# Patient Record
Sex: Female | Born: 1955 | Race: White | Hispanic: No | Marital: Married | Smoking: Never smoker
Health system: Southern US, Community
[De-identification: ages and names within clinical notes are randomized; demographics above are authoritative.]

## PROBLEM LIST (undated history)

## (undated) DIAGNOSIS — F319 Bipolar disorder, unspecified: Secondary | ICD-10-CM

## (undated) DIAGNOSIS — G8929 Other chronic pain: Secondary | ICD-10-CM

## (undated) DIAGNOSIS — K219 Gastro-esophageal reflux disease without esophagitis: Secondary | ICD-10-CM

## (undated) DIAGNOSIS — Z8639 Personal history of other endocrine, nutritional and metabolic disease: Secondary | ICD-10-CM

## (undated) DIAGNOSIS — G43109 Migraine with aura, not intractable, without status migrainosus: Secondary | ICD-10-CM

## (undated) DIAGNOSIS — K589 Irritable bowel syndrome without diarrhea: Secondary | ICD-10-CM

## (undated) DIAGNOSIS — K2270 Barrett's esophagus without dysplasia: Secondary | ICD-10-CM

## (undated) DIAGNOSIS — M810 Age-related osteoporosis without current pathological fracture: Secondary | ICD-10-CM

## (undated) DIAGNOSIS — E7800 Pure hypercholesterolemia, unspecified: Secondary | ICD-10-CM

## (undated) DIAGNOSIS — M5020 Other cervical disc displacement, unspecified cervical region: Secondary | ICD-10-CM

## (undated) DIAGNOSIS — M1990 Unspecified osteoarthritis, unspecified site: Secondary | ICD-10-CM

## (undated) DIAGNOSIS — T7840XA Allergy, unspecified, initial encounter: Secondary | ICD-10-CM

## (undated) DIAGNOSIS — M3390 Dermatopolymyositis, unspecified, organ involvement unspecified: Secondary | ICD-10-CM

## (undated) DIAGNOSIS — R519 Headache, unspecified: Secondary | ICD-10-CM

## (undated) DIAGNOSIS — F419 Anxiety disorder, unspecified: Secondary | ICD-10-CM

## (undated) DIAGNOSIS — K602 Anal fissure, unspecified: Secondary | ICD-10-CM

## (undated) DIAGNOSIS — F32A Depression, unspecified: Secondary | ICD-10-CM

## (undated) DIAGNOSIS — M3313 Other dermatomyositis without myopathy: Secondary | ICD-10-CM

## (undated) HISTORY — DX: Gastro-esophageal reflux disease without esophagitis: K21.9

## (undated) HISTORY — DX: Irritable bowel syndrome, unspecified: K58.9

## (undated) HISTORY — DX: Personal history of other endocrine, nutritional and metabolic disease: Z86.39

## (undated) HISTORY — DX: Unspecified osteoarthritis, unspecified site: M19.90

## (undated) HISTORY — DX: Bipolar disorder, unspecified: F31.9

## (undated) HISTORY — DX: Age-related osteoporosis without current pathological fracture: M81.0

## (undated) HISTORY — DX: Other chronic pain: G89.29

## (undated) HISTORY — DX: Depression, unspecified: F32.A

## (undated) HISTORY — DX: Migraine with aura, not intractable, without status migrainosus: G43.109

## (undated) HISTORY — DX: Other cervical disc displacement, unspecified cervical region: M50.20

## (undated) HISTORY — DX: Other dermatomyositis without myopathy: M33.13

## (undated) HISTORY — PX: GANGLION CYST EXCISION: SHX1691

## (undated) HISTORY — DX: Pure hypercholesterolemia, unspecified: E78.00

## (undated) HISTORY — DX: Anxiety disorder, unspecified: F41.9

## (undated) HISTORY — DX: Anal fissure, unspecified: K60.2

## (undated) HISTORY — PX: ESOPHAGOGASTRIC FUNDOPLICATION: SHX405

## (undated) HISTORY — DX: Barrett's esophagus without dysplasia: K22.70

## (undated) HISTORY — PX: APPENDECTOMY: SHX54

## (undated) HISTORY — PX: TUBAL LIGATION: SHX77

## (undated) HISTORY — DX: Allergy, unspecified, initial encounter: T78.40XA

## (undated) HISTORY — PX: HERNIA REPAIR: SHX51

## (undated) HISTORY — DX: Dermatopolymyositis, unspecified, organ involvement unspecified: M33.90

## (undated) HISTORY — DX: Headache, unspecified: R51.9

---

## 1979-06-27 HISTORY — PX: TONSILLECTOMY: SUR1361

## 1998-06-26 HISTORY — PX: OTHER SURGICAL HISTORY: SHX169

## 1999-06-27 HISTORY — PX: GALLBLADDER SURGERY: SHX652

## 2016-12-12 DIAGNOSIS — L659 Nonscarring hair loss, unspecified: Secondary | ICD-10-CM | POA: Insufficient documentation

## 2016-12-12 DIAGNOSIS — B081 Molluscum contagiosum: Secondary | ICD-10-CM | POA: Insufficient documentation

## 2016-12-12 DIAGNOSIS — K5790 Diverticulosis of intestine, part unspecified, without perforation or abscess without bleeding: Secondary | ICD-10-CM | POA: Insufficient documentation

## 2016-12-12 DIAGNOSIS — L280 Lichen simplex chronicus: Secondary | ICD-10-CM | POA: Insufficient documentation

## 2016-12-12 DIAGNOSIS — N952 Postmenopausal atrophic vaginitis: Secondary | ICD-10-CM | POA: Insufficient documentation

## 2016-12-12 DIAGNOSIS — G43909 Migraine, unspecified, not intractable, without status migrainosus: Secondary | ICD-10-CM | POA: Insufficient documentation

## 2016-12-12 DIAGNOSIS — B009 Herpesviral infection, unspecified: Secondary | ICD-10-CM | POA: Insufficient documentation

## 2016-12-12 DIAGNOSIS — K2970 Gastritis, unspecified, without bleeding: Secondary | ICD-10-CM | POA: Insufficient documentation

## 2016-12-12 DIAGNOSIS — E611 Iron deficiency: Secondary | ICD-10-CM | POA: Insufficient documentation

## 2016-12-12 DIAGNOSIS — B369 Superficial mycosis, unspecified: Secondary | ICD-10-CM | POA: Insufficient documentation

## 2016-12-12 DIAGNOSIS — Z889 Allergy status to unspecified drugs, medicaments and biological substances status: Secondary | ICD-10-CM | POA: Insufficient documentation

## 2016-12-12 DIAGNOSIS — F32A Depression, unspecified: Secondary | ICD-10-CM | POA: Insufficient documentation

## 2016-12-12 DIAGNOSIS — M797 Fibromyalgia: Secondary | ICD-10-CM | POA: Insufficient documentation

## 2016-12-12 DIAGNOSIS — Z9189 Other specified personal risk factors, not elsewhere classified: Secondary | ICD-10-CM | POA: Insufficient documentation

## 2017-05-11 DIAGNOSIS — E559 Vitamin D deficiency, unspecified: Secondary | ICD-10-CM | POA: Insufficient documentation
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
## 2018-01-12 ENCOUNTER — Ambulatory Visit (INDEPENDENT_AMBULATORY_CARE_PROVIDER_SITE_OTHER): Payer: Medicare Other | Admitting: Psychiatry

## 2018-01-12 ENCOUNTER — Encounter (INDEPENDENT_AMBULATORY_CARE_PROVIDER_SITE_OTHER): Payer: Self-pay

## 2018-01-12 ENCOUNTER — Other Ambulatory Visit (HOSPITAL_COMMUNITY): Payer: Self-pay

## 2018-01-12 ENCOUNTER — Encounter (HOSPITAL_COMMUNITY): Payer: Self-pay | Admitting: Psychiatry

## 2018-01-12 VITALS — BP 115/78 | HR 88 | Ht 68.0 in | Wt 159.6 lb

## 2018-01-12 DIAGNOSIS — F331 Major depressive disorder, recurrent, moderate: Secondary | ICD-10-CM | POA: Diagnosis not present

## 2018-01-12 MED ORDER — DULOXETINE HCL 60 MG PO CPEP: 60.0000 mg | ORAL_CAPSULE | Freq: Every day | ORAL | 0 refills | Status: DC

## 2018-01-12 MED ORDER — DULOXETINE HCL 40 MG PO CPEP: 40.0000 mg | ORAL_CAPSULE | Freq: Every evening | ORAL | 0 refills | Status: DC

## 2018-01-12 MED ORDER — DIVALPROEX SODIUM 250 MG PO DR TAB: 250.0000 mg | DELAYED_RELEASE_TABLET | Freq: Two times a day (BID) | ORAL | 0 refills | Status: DC

## 2018-01-12 MED ORDER — CLONAZEPAM 1 MG PO TABS: 1.0000 mg | ORAL_TABLET | Freq: Two times a day (BID) | ORAL | 0 refills | Status: DC

## 2018-01-12 NOTE — Patient Instructions (Addendum)
1. Increase Duloxetine 60 mg daily in the morning, 40 mg at night 2. Continue Valproic acid 250 mg daily, 500 mg qhs  3. Increase clonazepam 2 mg twice a day  4. Obtain blood test  4. Return to clinic in one month for 30 mins 5. Referral to Intensive outpatient program 6. CONTACT INFORMATION  What to do if you need to get in touch with someone regarding a psychiatric issue:  1. EMERGENCY: For psychiatric emergencies (if you are suicidal or if there are any other safety issues) call 911 and/or go to your nearest Emergency Room immediately.   2. IF YOU NEED SOMEONE TO TALK TO RIGHT NOW: Given my clinical responsibilities, I may not be able to speak with you over the phone for a prolonged period of time.  a. You may always call The National Suicide Prevention Lifeline at 1-800-273-TALK (332) [phone](8255).

## 2018-01-13 LAB — SPECIMEN STATUS REPORT

## 2018-01-14 LAB — COMPREHENSIVE METABOLIC PANEL
ALT: 26 IU/L (ref 0–32)
AST: 41 IU/L — ABNORMAL HIGH (ref 0–40)
Albumin/Globulin Ratio: 1.4 (ref 1.2–2.2)
Albumin: 4.2 g/dL (ref 3.6–4.8)
Alkaline Phosphatase: 115 IU/L (ref 39–117)
BUN/Creatinine Ratio: 25 (ref 12–28)
BUN: 24 mg/dL (ref 8–27)
Bilirubin Total: <0.2 mg/dL (ref 0.0–1.2)
CO2: 22 mmol/L (ref 20–29)
Calcium: 9.3 mg/dL (ref 8.7–10.3)
Chloride: 99 mmol/L (ref 96–106)
Creatinine, Ser: 0.97 mg/dL (ref 0.57–1.00)
GFR calc Af Amer: 72 mL/min/{1.73_m2} (ref >59)
GFR calc non Af Amer: 63 mL/min/{1.73_m2} (ref >59)
Globulin, Total: 3 g/dL (ref 1.5–4.5)
Glucose: 74 mg/dL (ref 65–99)
Potassium: 5.2 mmol/L (ref 3.5–5.2)
Sodium: 138 mmol/L (ref 134–144)
Total Protein: 7.2 g/dL (ref 6.0–8.5)

## 2018-01-14 LAB — CBC
Hematocrit: 40.1 % (ref 34.0–46.6)
Hemoglobin: 12.5 g/dL (ref 11.1–15.9)
MCH: 28.1 pg (ref 26.6–33.0)
MCHC: 31.2 g/dL — ABNORMAL LOW (ref 31.5–35.7)
MCV: 90 fL (ref 79–97)
Platelets: 296 10*3/uL (ref 150–450)
RBC: 4.45 x10E6/uL (ref 3.77–5.28)
RDW: 14 % (ref 12.3–15.4)
WBC: 10.2 10*3/uL (ref 3.4–10.8)

## 2018-01-14 LAB — PSA, TOTAL AND FREE
PSA, Free: <0.01 ng/mL
Prostate Specific Ag, Serum: <0.1 ng/mL

## 2018-01-14 LAB — TSH: TSH: 4.51 u[IU]/mL — ABNORMAL HIGH (ref 0.450–4.500)

## 2018-01-14 LAB — VALPROIC ACID LEVEL

## 2018-01-15 ENCOUNTER — Telehealth (HOSPITAL_COMMUNITY): Payer: Self-pay | Admitting: Professional

## 2018-01-15 LAB — SPECIMEN STATUS REPORT

## 2018-01-15 LAB — T4, FREE: Free T4: 0.93 ng/dL (ref 0.82–1.77)

## 2018-01-16 ENCOUNTER — Other Ambulatory Visit (HOSPITAL_COMMUNITY): Payer: Medicare Other | Attending: Psychiatry | Admitting: Licensed Clinical Social Worker

## 2018-01-16 ENCOUNTER — Other Ambulatory Visit (HOSPITAL_COMMUNITY): Payer: Self-pay

## 2018-01-16 DIAGNOSIS — F332 Major depressive disorder, recurrent severe without psychotic features: Secondary | ICD-10-CM | POA: Insufficient documentation

## 2018-01-16 DIAGNOSIS — Z818 Family history of other mental and behavioral disorders: Secondary | ICD-10-CM | POA: Diagnosis not present

## 2018-01-16 DIAGNOSIS — Z915 Personal history of self-harm: Secondary | ICD-10-CM | POA: Insufficient documentation

## 2018-01-16 DIAGNOSIS — F419 Anxiety disorder, unspecified: Secondary | ICD-10-CM | POA: Diagnosis not present

## 2018-01-16 DIAGNOSIS — D8989 Other specified disorders involving the immune mechanism, not elsewhere classified: Secondary | ICD-10-CM | POA: Diagnosis not present

## 2018-01-16 DIAGNOSIS — F329 Major depressive disorder, single episode, unspecified: Secondary | ICD-10-CM | POA: Diagnosis present

## 2018-01-16 DIAGNOSIS — F079 Unspecified personality and behavioral disorder due to known physiological condition: Secondary | ICD-10-CM | POA: Insufficient documentation

## 2018-01-16 DIAGNOSIS — Z79899 Other long term (current) drug therapy: Secondary | ICD-10-CM | POA: Diagnosis not present

## 2018-01-16 DIAGNOSIS — R4589 Other symptoms and signs involving emotional state: Secondary | ICD-10-CM | POA: Diagnosis not present

## 2018-01-16 DIAGNOSIS — F331 Major depressive disorder, recurrent, moderate: Secondary | ICD-10-CM

## 2018-01-17 ENCOUNTER — Telehealth (HOSPITAL_COMMUNITY): Payer: Self-pay | Admitting: Professional

## 2018-01-17 ENCOUNTER — Telehealth (HOSPITAL_COMMUNITY): Payer: Self-pay | Admitting: Psychiatry

## 2018-01-17 ENCOUNTER — Other Ambulatory Visit: Payer: Self-pay

## 2018-01-17 ENCOUNTER — Other Ambulatory Visit (HOSPITAL_COMMUNITY): Payer: Medicare Other | Admitting: Occupational Therapy

## 2018-01-17 ENCOUNTER — Other Ambulatory Visit (HOSPITAL_COMMUNITY): Payer: Medicare Other | Admitting: Licensed Clinical Social Worker

## 2018-01-17 ENCOUNTER — Encounter (HOSPITAL_COMMUNITY): Payer: Self-pay | Admitting: Occupational Therapy

## 2018-01-17 DIAGNOSIS — F079 Unspecified personality and behavioral disorder due to known physiological condition: Secondary | ICD-10-CM | POA: Diagnosis not present

## 2018-01-17 DIAGNOSIS — D8989 Other specified disorders involving the immune mechanism, not elsewhere classified: Secondary | ICD-10-CM | POA: Diagnosis not present

## 2018-01-17 DIAGNOSIS — F332 Major depressive disorder, recurrent severe without psychotic features: Secondary | ICD-10-CM

## 2018-01-17 DIAGNOSIS — Z915 Personal history of self-harm: Secondary | ICD-10-CM | POA: Diagnosis not present

## 2018-01-17 DIAGNOSIS — R4589 Other symptoms and signs involving emotional state: Secondary | ICD-10-CM

## 2018-01-17 DIAGNOSIS — F419 Anxiety disorder, unspecified: Secondary | ICD-10-CM | POA: Diagnosis not present

## 2018-01-17 DIAGNOSIS — Z818 Family history of other mental and behavioral disorders: Secondary | ICD-10-CM | POA: Diagnosis not present

## 2018-01-17 DIAGNOSIS — F329 Major depressive disorder, single episode, unspecified: Secondary | ICD-10-CM | POA: Diagnosis present

## 2018-01-17 DIAGNOSIS — Z79899 Other long term (current) drug therapy: Secondary | ICD-10-CM | POA: Diagnosis not present

## 2018-01-17 LAB — VALPROIC ACID LEVEL: Valproic Acid Lvl: 43 ug/mL — ABNORMAL LOW (ref 50–100)

## 2018-01-17 NOTE — Therapy (Signed)
[facility] PARTIAL HOSPITALIZATION PROGRAM [address] [place], [place], [address] Phone: [phone]   Fax:  [phone]  Occupational Therapy Evaluation  Patient Details  Name: Tina Pena MRN: [id number] Date of Birth: 02-11-1956 Referring Provider: Hillery Jacks, NP   Encounter Date: 01/17/2018  OT End of Session - 01/17/18 1459    Visit Number  1    Number of Visits  16    Date for OT Re-Evaluation  02/14/18    Authorization Type  Medicare    OT Start Time  1100    OT Stop Time  1230    OT Time Calculation (min)  90 min    Activity Tolerance  Patient tolerated treatment well    Behavior During Therapy  [facility] for tasks assessed/performed       Past Medical History:  Diagnosis Date  . Bipolar disorder [facility])     Past Surgical History:  Procedure Laterality Date  . APPENDECTOMY    . GALLBLADDER SURGERY  2001  . HERNIA REPAIR    . TONSILLECTOMY  1981  . toupetfundoplicatio  2000  . TUBAL LIGATION      There were no vitals filed for this visit.  Subjective Assessment - 01/17/18 1452    Currently in Pain?  Other (Comment) chronic pain        [facility] OT Assessment - 01/17/18 0001      Assessment   Medical Diagnosis  Major Depressive disorder, severe    Referring Provider  Hillery Jacks, NP    Onset Date/Surgical Date  01/17/18      Balance Screen   Has the patient fallen in the past 6 months  No    Has the patient had a decrease in activity level because of a fear of falling?   No    Is the patient reluctant to leave their home because of a fear of falling?   No        OT assessment: OCAIRS  Diagnosis: Major Depressive disorder, severe  Past medical history/referral information: Pt has significant PMH of mental health issues and suicide attempts, pt is referred from personal counselor and referred by provider Hillery Jacks, NP.  Living situation: Pt lives with husband and dog  ADLs/IADLs: Pt reports difficulty engaging in  BADL/IADL 2/2 to physical and mental limitations. Pt enjoys gardening and cannot engage as successfully 2/2 difficulty with hands (upcomin LRTI surgery). Pt reports isolation and family relationship difficulties as well.  Work: Pt has not worked since 2014, been on disability. Was previously a Human resources officer.  Leisure: Pt enjoys gardening as much as she can, reading, and exercising the little that she can  Social support: Pt reports husband as good social support. Pt shares she has a history of severely lying to friends and family members which has significantly harmed and destroyed relationships. She wishes to make amends with her family members, but believes some of her lies are so complex that she cannot make amends. Son lives in [place] and daughter in [place], wishes to see them more.  Struggles: emotional regulation, managing self harm  OT goal: accountability, coping skills  OCAIRS Mental Health Interview Summary of Client Scores:  FACILITATES PARTICIPATION IN OCCUPATION  ALLOWS PARTICIPATION IN OCCUPATION INHIBITS PARTICIPATION IN OCCUPATION RESTRICTS PARTICIPATION IN OCCUPATION COMMENTS  ROLES                X  Not working, little identified roles  HABITS  X  Very dissatisfied with routine  PERSONAL CAUSATION                X     VALUES                             X  Loosely identified  INTERESTS                X  Limited physically and mentally  SKILLS                X     SHORT TERM GOALS                X    LONG TERM GOALS                X    INTERPETATION OF PAST EXPERIENCES                X  All negative except when asked about best period  PHYSICAL ENVIRONMENT                X    SOCIAL ENVIRONMENT                X     READINESS FOR CHANGE                X      Need for Occupational Therapy:  4 Shows positive occupational participation, no need for OT.   3 Need for minimal intervention/consultative participation    X 2 Need for OT intervention indicated to  restore/improve participation   1 Need for extensive OT intervention indicated to improve participation.  Referral for follow up services also recommended.    Assessment:  Patient demonstrates behavior that inhibits participation in occupation. Patient will benefit from occupational therapy intervention in order to improve time management, financial management, stress management, job readiness skills, social skills, and health management skills in preparation to return to full time community living and to be a productive community member.    Plan:  Patient will participate in skilled occupational therapy sessions individually or in a group setting to improve coping skills, psychosocial skills, and emotional skills required to return to prior level of function. Treatment will be 4-5 times per week for 3 weeks.      OT Treatment  S: "My daughter will tell me I am passive aggressive but I don't see it"  O: Assertiveness quiz given to increase insight on pt current skills and how to improve based on given score. Further education given on assertive conversation, situations, body language, and appropriate context for skill. Education and worksheet given to identify three definitions (assertive, passive, and aggressive) with scenarios to identify assertive, passive, and aggressive with verbal and written examples. Education given on assertiveness techniques and effective assertiveness communication to apply to daily life when reintegrating into community.  A: Pt presents to group with animated affect, engaged and participatory throughout session. Assertiveness quiz completed with score stating pt is mostly nonassertive and needs consistent practice to maintain assertiveness in a variety of settings. Assertiveness training worksheet completed, pt shares others describe her as passive aggressive but her insight is limited. After education pt states understanding of passive aggressive tendencies and open to  communicating more assertively. Pt states her goal is to work on assertiveness in her close relationships.    P: Pt provided with assertiveness skills to implement into a  variety of daily social situations. OT will continue to follow up with communication skills for successful implementation into daily life.?          OT Education - 01/17/18 1455    Education Details  education given on assertiveness skills to apply when reintegrating into community    Person(s) Educated  Patient    Methods  Explanation;Handout    Comprehension  Verbalized understanding       OT Short Term Goals - 01/17/18 1503      OT SHORT TERM GOAL #1   Title  Pt will recall and apply accountability skills to improve relationships when reintegrating into the community    Time  4    Period  Weeks    Status  New    Target Date  02/14/18      OT SHORT TERM GOAL #2   Title  Pt will be educated on strategies to improve psychosocial skills needed to participate fully in all daily, work, and leisure activities    Time  4    Period  Weeks    Status  New      OT SHORT TERM GOAL #3   Title  Pt will aply psychosocial skills and coping mechanisms to daily activities in order to function independently and reintegrate into community dwelling    Time  4    Period  Weeks    Status  New               Plan - 01/17/18 1501    Occupational performance deficits (Please refer to evaluation for details):  ADL's;IADL's;Rest and Sleep;Education;Work;Leisure;Social Participation    Rehab Potential  Good    Current Impairments/barriers affecting progress:  physical limitations affecting mental health    OT Frequency  5x / week    OT Duration  4 weeks    OT Treatment/Interventions  Psychosocial skills training;Coping strategies training;Self-care/ADL training community reintegration    Consulted and Agree with Plan of Care  Patient       Patient will benefit from skilled therapeutic intervention in order to  improve the following deficits and impairments:  Decreased coping skills, Decreased psychosocial skills, Other (comment)(decreased ability to engage in BADL and reintegrate into community)  Visit Diagnosis: 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 MDD (major depressive disorder), recurrent episode, moderate (HCC) 01/12/2018   Dalphine Handing, MSOT, OTR/L  [place] 01/17/2018, 3:10 PM  [facility] PARTIAL HOSPITALIZATION PROGRAM [address] [place], [place], [address] Phone: [phone]   Fax:  [phone]  Name: Attie Nawabi MRN: [id number] Date of Birth: 02/02/1956

## 2018-01-17 NOTE — Telephone Encounter (Signed)
Discussed the test result with the patient.   VPA level is low; however, given the recent change in her medication and her enrollment in IOP, will continue current depakote dose at this time. Will consider uptitration in the future as indicated.   TSH- although slightly higher than normal range, fT4 is wnl. She is advised to be followed at annual checkup by PCP.
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
## 2018-01-18 ENCOUNTER — Other Ambulatory Visit (HOSPITAL_COMMUNITY): Payer: Medicare Other | Admitting: Occupational Therapy

## 2018-01-18 ENCOUNTER — Other Ambulatory Visit (HOSPITAL_COMMUNITY): Payer: Medicare Other | Admitting: Licensed Clinical Social Worker

## 2018-01-18 ENCOUNTER — Encounter (HOSPITAL_COMMUNITY): Payer: Self-pay | Admitting: Family

## 2018-01-18 VITALS — BP 118/76 | HR 75 | Ht 68.25 in | Wt 160.0 lb

## 2018-01-18 DIAGNOSIS — F079 Unspecified personality and behavioral disorder due to known physiological condition: Secondary | ICD-10-CM

## 2018-01-18 DIAGNOSIS — F332 Major depressive disorder, recurrent severe without psychotic features: Secondary | ICD-10-CM | POA: Diagnosis not present

## 2018-01-18 DIAGNOSIS — R4589 Other symptoms and signs involving emotional state: Secondary | ICD-10-CM

## 2018-01-18 NOTE — Progress Notes (Signed)
Behavioral Health Partial Program Assessment Note  Date: 01/18/2018 Name: Tina Pena MRN: [id number]  Chief Complaint: Worsening Depression    HPI: Patient is a [age] y.o. Caucasian female presents with depression.  Reports she felt life her life stated to come apart after a suicide attempt with cutting herself all over with a box cutter. States she was then charged with filling a false police reports and has been fighting a Administrator, sportsmisdemeanor charge.  States she has not disclosed this information to her husband as she reports her husband is a unsupportive regarding her mental health issues.  Reports diagnosis of bipolar, depression, anxiety and paranoia.  patient is currently denying suicidal homicidal ideations.  Reports she is followed by MD Hisada.  Reports she is prescribed gabapentin 300 mg, Duloxetine 40 mg, clonazepam 1 mg PRN, reports she was recently initiated on Depakote 250 mg p.o. twice daily.  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  Patient was enrolled in partial psychiatric program on 01/18/18.  Primary complaints include: anxiety attacks.  Onset of symptoms was gradual with gradually worsening course since that time. Psychosocial Stressors include the following: family and marital.   I have reviewed the following documentation dated 01/19/2018: past psychiatric history, past medical history and past social and family history  Complaints of Pain: Reports pain related to autoimmune disorder Past Psychiatric History:  Previous suicide attempts 2  Currently in treatment with Cymbalta 60, Depakote 250, Klonopin 1 mg p.o twice daily and Neurontin 300 mg p.o. twice daily.  Substance Abuse History: none Use of Alcohol: denied Use of Caffeine: denies use Use of over the counter:   Past Surgical History:  Procedure Laterality Date  . APPENDECTOMY    . GALLBLADDER SURGERY  2001  . HERNIA REPAIR    . TONSILLECTOMY  1981  . toupetfundoplicatio  2000   . TUBAL LIGATION      Past Medical History:  Diagnosis Date  . Bipolar disorder [facility](HCC)    Outpatient Encounter Medications as of 01/18/2018  Medication Sig  . [START ON 01/25/2018] clonazePAM (KLONOPIN) 1 MG tablet Take 1 tablet (1 mg total) by mouth 2 (two) times daily.  Marland Kitchen. conjugated estrogens (PREMARIN) vaginal cream Place 1 Applicatorful vaginally daily. Twice a week  . divalproex (DEPAKOTE) 250 MG DR tablet Take 1 tablet (250 mg total) by mouth 2 (two) times daily. 1 in the AM and 2 in the PM  . DULoxetine (CYMBALTA) 60 MG capsule Take 1 capsule (60 mg total) by mouth daily. 1 tab by mouth daily  . DULoxetine HCl 40 MG CPEP Take 40 mg by mouth every evening.  . eszopiclone (LUNESTA) 1 MG TABS tablet Take 1 mg by mouth at bedtime as needed for sleep. Take immediately before bedtime as needed  . gabapentin (NEURONTIN) 300 MG capsule Take 300 mg by mouth 2 (two) times daily. 1 in the AM and 1 in the PM  . hydroxychloroquine (PLAQUENIL) 200 MG tablet Take 200 mg by mouth 2 (two) times daily. 1 tab by mouth in AM, 1 tab by mouth in PM  . Loratadine-Pseudoephedrine (CLARITIN-D 24 HOUR PO) Take by mouth. 1 tab by mouth in AM as needed  . rizatriptan (MAXALT) 5 MG tablet Take 5 mg by mouth as needed for migraine. May repeat in 2 hours if needed  . traMADol (ULTRAM) 50 MG tablet Take 50 mg by mouth every 6 (six) hours as needed for moderate pain (6-8 per day depending on pain).   No facility-administered  encounter medications on file as of 01/18/2018.    Not on File  Social History   Tobacco Use  . Smoking status: Never Smoker  . Smokeless tobacco: Never Used  Substance Use Topics  . Alcohol use: Yes    Alcohol/week: 0.6 oz    Types: 1 Glasses of wine per week    Comment: rare   Functioning Relationships: strained with spouse or significant others Education: College       Please specify degree:  Other Pertinent History: None legal Family History  Problem Relation Age of Onset  .  Bipolar disorder Father   . Anxiety disorder Father   . Depression Father   . Paranoid behavior Father   . Bipolar disorder Maternal Grandmother   . Dementia Maternal Grandmother   . Bipolar disorder Paternal Grandmother   . Dementia Paternal Grandmother      Review of Systems Constitutional: negative  Objective:  There were no vitals filed for this visit.  Physical Exam:   Mental Status Exam: Appearance:  Well groomed Psychomotor::  Within Normal Limits Attention span and concentration: Normal Behavior: calm, cooperative and adequate rapport can be established Speech:  normal pitch Mood:  depressed and anxious Affect:  normal Thought Process:  Coherent Thought Content:  Hallucinations: None Orientation:  person, place and time/date Cognition:  grossly intact Insight:  Intact Judgment:  Fair Estimate of Intelligence: Average Fund of knowledge: Aware of current events Memory: Recent and remote intact Abnormal movements: None Gait and station: Normal  Assessment:  Diagnosis: MDD (major depressive disorder), recurrent severe, without psychosis (HCC) [F33.2] 1. MDD (major depressive disorder), recurrent severe, without psychosis (HCC)     Indications for admission: inpatient care required if not in partial hospital program  Plan: Order placed for occupational therapy (OT) Patient enrolled in Partial Hospitalization Program Patient's current medications are to be continued comprehensive treatment plan will be developed and side effects of medications have been reviewed with patient   Chart reviewed Valproic level 43 on 01/16/2018 however per chart, however Depakote was initiated recently per MD Hisada (Psychiatrist)  Treatment options and alternatives reviewed with patient and patient understands the above plan.  Treatment reviewed and agreed upon by NP T Melvyn Neth and Tina Pena need for continued group services       Oneta Rack, NP
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
## 2018-01-21 ENCOUNTER — Other Ambulatory Visit (HOSPITAL_COMMUNITY): Payer: Medicare Other | Admitting: Licensed Clinical Social Worker

## 2018-01-21 ENCOUNTER — Encounter (HOSPITAL_COMMUNITY): Payer: Self-pay | Admitting: Occupational Therapy

## 2018-01-21 ENCOUNTER — Other Ambulatory Visit (HOSPITAL_COMMUNITY): Payer: Medicare Other | Admitting: Occupational Therapy

## 2018-01-21 DIAGNOSIS — F079 Unspecified personality and behavioral disorder due to known physiological condition: Secondary | ICD-10-CM

## 2018-01-21 DIAGNOSIS — F332 Major depressive disorder, recurrent severe without psychotic features: Secondary | ICD-10-CM | POA: Diagnosis not present

## 2018-01-21 DIAGNOSIS — D8989 Other specified disorders involving the immune mechanism, not elsewhere classified: Secondary | ICD-10-CM | POA: Diagnosis not present

## 2018-01-21 DIAGNOSIS — R4589 Other symptoms and signs involving emotional state: Secondary | ICD-10-CM

## 2018-01-21 DIAGNOSIS — Z818 Family history of other mental and behavioral disorders: Secondary | ICD-10-CM | POA: Diagnosis not present

## 2018-01-21 DIAGNOSIS — Z915 Personal history of self-harm: Secondary | ICD-10-CM | POA: Diagnosis not present

## 2018-01-21 DIAGNOSIS — F419 Anxiety disorder, unspecified: Secondary | ICD-10-CM | POA: Diagnosis not present

## 2018-01-21 DIAGNOSIS — Z79899 Other long term (current) drug therapy: Secondary | ICD-10-CM | POA: Diagnosis not present

## 2018-01-21 DIAGNOSIS — F329 Major depressive disorder, single episode, unspecified: Secondary | ICD-10-CM | POA: Diagnosis present

## 2018-01-21 NOTE — Therapy (Addendum)
[facility] PARTIAL HOSPITALIZATION PROGRAM [address] [place], [place], [address] Phone: [phone]   Fax:  [phone]  Occupational Therapy Treatment  Patient Details  Name: Chelly Dombeck MRN: [id number] Date of Birth: [birth year] Referring Provider: Hillery Jacks, NP   Encounter Date: 01/18/2018  OT End of Session - 01/21/18 1333    Visit Number  2    Number of Visits  16    Date for OT Re-Evaluation  02/14/18    Authorization Type  Medicare    OT Start Time  1100    OT Stop Time  1200    OT Time Calculation (min)  60 min    Activity Tolerance  Patient tolerated treatment well    Behavior During Therapy  [facility] for tasks assessed/performed       Past Medical History:  Diagnosis Date  . Bipolar disorder [facility])     Past Surgical History:  Procedure Laterality Date  . APPENDECTOMY    . GALLBLADDER SURGERY  2001  . HERNIA REPAIR    . TONSILLECTOMY  1981  . toupetfundoplicatio  2000  . TUBAL LIGATION      There were no vitals filed for this visit.  Subjective Assessment - 01/21/18 1332    Currently in Pain?  Other (Comment) chronic pain       O: Education given on definition and importance of positive self-esteem in daily life and relationships with focus on using positive self-talk. Further education given on the relationship between self-esteem and mental illness and both high and low self-esteem factors. Worksheet given for pt to identify a positive trait about themselves with each letter of the alphabet to use as reference for future times of need and to gain insight on numerous positive qualities.?Additional self esteem worksheet given to identify positive aspects in every area of life (work, leisure, relationships, etc.). Pt encouraged to display worksheets in easily accessible area to serve as visual reminder in daily routine. Pt asked to identify 3 things they love about themselves this date.   A: Pt presents to group with blunted  affect, participatory and engaged throughout entirety of session. Pt originally with negative mindset with self esteem activities, with encouragement client engaged appropriately, group members also helped build up pt in a positive manner. Pt completed self-esteem alphabet activity with minimal verbal cues. Pt also provided support to other group members to help brainstorm ideas. Pt completed additional self esteem worksheet, becoming tearful when thinking about her interaction she shared with her son's girlfriend. 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     P: Pt provided with self-esteem boosting skills to implement into a variety of daily activities/routines. OT will continue to follow up for successful implementation into daily life.                 OT Education - 01/21/18 1332    Education Details  education given on self esteem and how to improve when reintegrating into community    Person(s) Educated  Patient    Methods  Explanation;Handout    Comprehension  Verbalized understanding       OT Short Term Goals - 01/21/18 1333      OT SHORT TERM GOAL #1   Title  Pt will recall and apply accountability skills to improve relationships when reintegrating into the community    Time  4    Period  Weeks    Status  On-going    Target Date  02/14/18  OT SHORT TERM GOAL #2   Title  Pt will be educated on strategies to improve psychosocial skills needed to participate fully in all daily, work, and leisure activities    Time  4    Period  Weeks    Status  On-going      OT SHORT TERM GOAL #3   Title  Pt will aply psychosocial skills and coping mechanisms to daily activities in order to function independently and reintegrate into community dwelling    Time  4    Period  Weeks    Status  On-going               Plan - 01/21/18 1333    Occupational performance deficits (Please refer to evaluation for details):  ADL's;IADL's;Rest and  Sleep;Education;Work;Leisure;Social Participation       Patient will benefit from skilled therapeutic intervention in order to improve the following deficits and impairments:  Decreased coping skills, Decreased psychosocial skills, Other (comment)(decreased ability to engage in BADL and reintegrate into community)  Visit Diagnosis: 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 MDD (major depressive disorder), recurrent episode, moderate (HCC) 01/12/2018   Dalphine HandingKaylee Keland Peyton, MSOT, OTR/L  [place] Karah Caruthers 01/21/2018, 1:34 PM  [facility] [address] [place], [place], [address] Phone: [phone]   Fax:  [phone]  Name: Dena BilletStephanie Boom MRN: [id number] Date of Birth: December 21, 1955

## 2018-01-21 NOTE — Therapy (Signed)
[facility] PARTIAL HOSPITALIZATION PROGRAM [address] [place], [place], [address] Phone: [phone]   Fax:  [phone]  Occupational Therapy Treatment  Patient Details  Name: Tina Pena MRN: [id number] Date of Birth: 1955-07-14 Referring Provider: Hillery Jacks, NP   Encounter Date: 01/21/2018  OT End of Session - 01/21/18 1532    Visit Number  3    Number of Visits  16    Date for OT Re-Evaluation  02/14/18    Authorization Type  Medicare    OT Start Time  1115    OT Stop Time  1215    OT Time Calculation (min)  60 min    Activity Tolerance  Patient tolerated treatment well    Behavior During Therapy  [facility] for tasks assessed/performed       Past Medical History:  Diagnosis Date  . Bipolar disorder [facility])     Past Surgical History:  Procedure Laterality Date  . APPENDECTOMY    . GALLBLADDER SURGERY  2001  . HERNIA REPAIR    . TONSILLECTOMY  1981  . toupetfundoplicatio  2000  . TUBAL LIGATION      There were no vitals filed for this visit.  Subjective Assessment - 01/21/18 1530    Currently in Pain?  Other (Comment) mentions of chronic pain        S: "I think sense of purpose is very important"   O: Education given on protective factors and their importance in building resiliency to face difficult life challenges. Protective factors worksheet completed. Pt to rate current protective factors of social support, coping skills, physical health, sense of purpose, self-esteem, and healthy thinking on a scale from weak-moderate-strong. Pt then to identify the most valuable protective factor, 2 protective factors to improve, and specific goals to accomplish this task.   A: Pt presents to group with flat affect, engaged and offering examples with other group members and facilitator. Pt completed protective factors worksheet, identifying that she scores strong on coping skills and self esteem. Pt with poor insight and awareness, after  considerable difficulty with self esteem on previous session (pt very tearful). Pt plans to work on improving social support by working on the relationship with her and her husband.  P: Education given on protective factors and goal setting using art as therapeutic tool. OT will continue follow up with pt to ensure successful implementation in daily life.                    OT Education - 01/21/18 1531    Education Details  education given on protective factors to learn to increase resiliency to difficult situations when reintegrating into community    Person(s) Educated  Patient    Methods  Explanation;Handout    Comprehension  Verbalized understanding       OT Short Term Goals - 01/21/18 1333      OT SHORT TERM GOAL #1   Title  Pt will recall and apply accountability skills to improve relationships when reintegrating into the community    Time  4    Period  Weeks    Status  On-going    Target Date  02/14/18      OT SHORT TERM GOAL #2   Title  Pt will be educated on strategies to improve psychosocial skills needed to participate fully in all daily, work, and leisure activities    Time  4    Period  Weeks    Status  On-going      OT SHORT TERM GOAL #3   Title  Pt will aply psychosocial skills and coping mechanisms to daily activities in order to function independently and reintegrate into community dwelling    Time  4    Period  Weeks    Status  On-going               Plan - 01/21/18 1532    Occupational performance deficits (Please refer to evaluation for details):  ADL's;IADL's;Rest and Sleep;Work;Leisure;Social Participation       Patient will benefit from skilled therapeutic intervention in order to improve the following deficits and impairments:  Decreased coping skills, Decreased psychosocial skills, Other (comment)(decreased ability to engage in BADL and reintegrate into community)  Visit Diagnosis: Personality and behavioral disorder due to  known physiological condition  Difficulty coping  MDD (major depressive disorder), recurrent severe, without psychosis (HCC)    Problem List Patient Active Problem List   Diagnosis Date Noted  . MDD (major depressive disorder), recurrent episode, moderate (HCC) 01/12/2018   Dalphine HandingKaylee Jisela Merlino, MSOT, OTR/L  [place] Janaisa Birkland 01/21/2018, 3:34 PM  [facility] [address] [place], [place], [address] Phone: [phone]   Fax:  [phone]  Name: Dena BilletStephanie Boehme MRN: [id number] Date of Birth: 10/01/1955

## 2018-01-22 ENCOUNTER — Other Ambulatory Visit (HOSPITAL_COMMUNITY): Payer: Medicare Other | Admitting: Licensed Clinical Social Worker

## 2018-01-22 ENCOUNTER — Encounter (HOSPITAL_COMMUNITY): Payer: Self-pay | Admitting: Occupational Therapy

## 2018-01-22 ENCOUNTER — Other Ambulatory Visit (HOSPITAL_COMMUNITY): Payer: Medicare Other | Admitting: Occupational Therapy

## 2018-01-22 DIAGNOSIS — F332 Major depressive disorder, recurrent severe without psychotic features: Secondary | ICD-10-CM

## 2018-01-22 DIAGNOSIS — F079 Unspecified personality and behavioral disorder due to known physiological condition: Secondary | ICD-10-CM

## 2018-01-22 DIAGNOSIS — R4589 Other symptoms and signs involving emotional state: Secondary | ICD-10-CM

## 2018-01-22 NOTE — Therapy (Signed)
[facility] PARTIAL HOSPITALIZATION PROGRAM [address] [place], [place], [address] Phone: [phone]   Fax:  [phone]  Occupational Therapy Treatment  Patient Details  Name: Tina Pena MRN: [id number] Date of Birth: March 21, 1956 Referring Provider: Hillery Jacks, NP   Encounter Date: 01/22/2018  OT End of Session - 01/22/18 1430    Visit Number  4    Number of Visits  16    Date for OT Re-Evaluation  02/14/18    Authorization Type  Medicare    OT Start Time  1100    OT Stop Time  1200    OT Time Calculation (min)  60 min    Activity Tolerance  Patient tolerated treatment well    Behavior During Therapy  [facility] for tasks assessed/performed       Past Medical History:  Diagnosis Date  . Bipolar disorder [facility])     Past Surgical History:  Procedure Laterality Date  . APPENDECTOMY    . GALLBLADDER SURGERY  2001  . HERNIA REPAIR    . TONSILLECTOMY  1981  . toupetfundoplicatio  2000  . TUBAL LIGATION      There were no vitals filed for this visit.  Subjective Assessment - 01/22/18 1429    Currently in Pain?  Other (Comment) chronic pain        S: "I feel like I am doing better once I learned to listen"   O: Education given on communication skills to apply when reintegrating into community dwelling. Connection made of how mental health conditions tie into communication difficulties and how to improve these practices. Nonverbal and conversational skills discussed and applied to pt daily life. Further education given on how to practice these skills within the community to build confidence. Education also given on how to express feelings/emotions to others with appropriate social skills. Active listening discussed for further sessions, handout given for pt to reference. Pt asked to identify one area of communication they wish to develop this date.  ?  A: Pt presents to group with flat affect, engaged and participatory throughout session. Pt  educated on nonverbal and conversational skills to apply when reintegrating into community this date. Pt shares that she has most difficulty when expressing her emotions to her husband. Pt with minimal insight, stating she feels her communication is fine and it is more of her husband's flaw. After example given, pt given education to increase insight- pt in understanding but unsure if pt retained. Continued education needed to ensure understanding. Pt in understanding of active listening and acquired handout.   P: Pt provided with communication skills to implement when reintegrating into community dwelling. OT will continue follow up with communication skills for successful implementation in daily life.                   OT Education - 01/22/18 1429    Education Details  education given on communication skills to apply when reintegrating into community    Person(s) Educated  Patient    Methods  Explanation;Handout    Comprehension  Verbalized understanding       OT Short Term Goals - 01/21/18 1333      OT SHORT TERM GOAL #1   Title  Pt will recall and apply accountability skills to improve relationships when reintegrating into the community    Time  4    Period  Weeks    Status  On-going    Target Date  02/14/18  OT SHORT TERM GOAL #2   Title  Pt will be educated on strategies to improve psychosocial skills needed to participate fully in all daily, work, and leisure activities    Time  4    Period  Weeks    Status  On-going      OT SHORT TERM GOAL #3   Title  Pt will aply psychosocial skills and coping mechanisms to daily activities in order to function independently and reintegrate into community dwelling    Time  4    Period  Weeks    Status  On-going               Plan - 01/22/18 1430    Occupational performance deficits (Please refer to evaluation for details):  ADL's;IADL's;Rest and Sleep;Work;Leisure;Social Participation       Patient will  benefit from skilled therapeutic intervention in order to improve the following deficits and impairments:  Decreased coping skills, Decreased psychosocial skills, Other (comment)(decreased ability to engage in BADL and reintegrate into community)  Visit Diagnosis: 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 MDD (major depressive disorder), recurrent episode, moderate (HCC) 01/12/2018   Dalphine HandingKaylee Tamaira Ciriello, MSOT, OTR/L  [place] Zollie Ellery 01/22/2018, 2:31 PM  [facility] [address] [place], [place], [address] Phone: [phone]   Fax:  [phone]  Name: Dena BilletStephanie Yebra MRN: [id number] Date of Birth: Nov 10, 1955

## 2018-01-22 NOTE — Psych (Signed)
   [facility]  Tina BilletStephanie Silas [id number]  Session Time: 9:00 - 11:00  Participation Level: Active  Behavioral Response: CasualAlertDepressed  Type of Therapy: Group Therapy  Treatment Goals addressed: Coping  Interventions: CBT, DBT, Solution Focused, Supportive and Reframing  Summary: Clinician led check-in regarding current stressors and situation, and review of patient completed daily inventory. Clinician utilized active listening and empathetic response and validated patient emotions. Clinician facilitated processing group on pertinent issues.   Therapist Response: Tina Pena is a 62 y.o. female who presents with depression and mood symptoms. Patient arrived within time allowed and reports that she is feeling "hopeful." Patient rates her mood at a 5 on a scale of 1-10 with 10 being great. Pt shares she felt "better" after CCA yesterday and being able to "share my truths" with a professional. Pt reports struggling with irritability and becoming angry at a picture of family on Facebook last night. Pt reports her family is a trigger for her.  Patient able to process in group. Patient engaged in discussion.      Session Time: 11:00 -12:00   Participation Level: Active   Behavioral Response: CasualAlertDepressed   Type of Therapy: Group Therapy, OT   Treatment Goals addressed: Coping   Interventions: Psychosocial skills training, Supportive,    Summary:  Occupational Therapy group   Therapist Response: Patient engaged in group. See OT note.            Session Time: 12:00 - 12:45  Participation Level: Active  Behavioral Response: CasualAlertDepressed  Type of Therapy: Group Therapy, Activity Therapy  Treatment Goals addressed: Coping  Interventions: Psychologist, occupationalocial Skills Training, Supportive  Summary:  Reflection Group: Patients encouraged to practice skills and interpersonal techniques or work on mindfulness and relaxation techniques.  The importance of self-care and making skills part of a routine to increase usage were stressed   Therapist Response: Patient engaged and participated.        Session Time: 12:45- 2:00  Participation Level: Active  Behavioral Response: CasualAlertDepressed  Type of Therapy: Group Therapy, Psychoeducation; Psychotherapy  Treatment Goals addressed: Coping  Interventions: CBT; Solution focused; Supportive; Reframing  Summary: 12:45 - 1:50: Cln continued topic of cognitive distortions. Group discussed how to "challenge" the unhealthy thought patterns once recognized. Group reviewed "Socratic Questions" handout and went over how to challenge irrational thoughts using that handout.  1:50 -2:00 Clinician led check-out. Clinician assessed for immediate needs, medication compliance and efficacy, and safety concerns   Therapist Response: Patient engaged in activity. Pt states understanding of how to challenge unhealthy thoughts and successfully reframed examples in discussion.  At check-out, patient rates her mood at a 8 on a scale of 1-10 with 10 being great. Pt reports afternoon plans of running errands. Patient demonstrates some progress as evidenced by participating in first session. Patient denies SI/HI/self-harm thoughts at the end of group.     Suicidal/Homicidal: Nowithout intent/plan   Plan: Pt will continue in PHP while working to decrease depression symptoms and increase ability to manage symptoms as they arise.   Diagnosis: MDD (major depressive disorder), recurrent severe, without psychosis (HCC) [F33.2]    1. MDD (major depressive disorder), recurrent severe, without psychosis (HCC)       Donia GuilesJenny Dudley Mages, LCSW 01/22/2018

## 2018-01-23 ENCOUNTER — Encounter (HOSPITAL_COMMUNITY): Payer: Self-pay

## 2018-01-23 ENCOUNTER — Other Ambulatory Visit (HOSPITAL_COMMUNITY): Payer: Medicare Other | Admitting: Licensed Clinical Social Worker

## 2018-01-23 VITALS — BP 110/68 | HR 84 | Ht 68.25 in | Wt 161.0 lb

## 2018-01-23 DIAGNOSIS — F332 Major depressive disorder, recurrent severe without psychotic features: Secondary | ICD-10-CM

## 2018-01-23 NOTE — Psych (Signed)
   [facility]  Tina BilletStephanie Pena [id number]  Session Time: 9:00 - 10:15  Participation Level: Active  Behavioral Response: CasualAlertDepressed  Type of Therapy: Group Therapy  Treatment Goals addressed: Coping  Interventions: CBT, DBT, Solution Focused, Supportive and Reframing  Summary: Clinician led check-in regarding current stressors and situation, and review of patient completed daily inventory. Clinician utilized active listening and empathetic response and validated patient emotions. Clinician facilitated processing group on pertinent issues.   Therapist Response: Tina BilletStephanie Stolarz is a 62 y.o. female who presents with depression and mood symptoms. Patient arrived within time allowed and reports that she is feeling "okay." Patient rates her mood at a 7 on a scale of 1-10 with 10 being great. Pt reports she felt "really good" after group yesterday and was able to practice some tools. Pt reports her physical pain is exacerbated.  Patient able to process in group. Patient engaged in discussion.      Session Time: 10:15 -11:00  Participation Level: Active  Behavioral Response: CasualAlertDepressed  Type of Therapy: Group Therapy, psychoeducation, psychotherapy  Treatment Goals addressed: Coping  Interventions: CBT, DBT, Solution Focused, Supportive and Reframing  Summary:  Clinician led discussion on increasing social circle and support network. Group discussed barriers to being around people while going through their mental health struggles.     Therapist Response: Patient engaged and participated in discussion. Pt reports relying on friends for social support and she struggles that her husband is not supportive.      Session Time: 11:00 -12:00   Participation Level: Active   Behavioral Response: CasualAlertDepressed   Type of Therapy: Group Therapy, OT   Treatment Goals addressed: Coping   Interventions: Psychosocial skills training,  Supportive,    Summary:  Occupational Therapy group   Therapist Response: Patient engaged in group. See OT note.       Session Time: 12:00- 1:00  Participation Level: Active  Behavioral Response: CasualAlertDepressed  Type of Therapy: Group Therapy, Psychoeducation; Psychotherapy  Treatment Goals addressed: Coping  Interventions: CBT; Solution focused; Supportive; Reframing Summary: 12:00 - 12:50: Clinician introduced topic of "Positive Psychology." Group watched "The Happiness Advantage" TED talk and discussed how the "lens" through which they view life affects the way they feel. Pts identified a strategy they would be willing to try to change their "lens."  12:50 -1:00 Clinician led check-out. Clinician assessed for immediate needs, medication compliance and efficacy, and safety concerns.   Therapist Response:  Pt engaged in discussion regarding ways to train your mind to scan for the positive. Pt reports willingness to try daily exercise as a way to practice.   At check-out, patient rates her mood at a 5 on a scale of 1-10 with 10 being great. Patient reports weekend plans of having dinner with firends.  Patient demonstrates some progress as evidenced by practicing skills. Patient denies SI/HI/self-harm at the end of group.    Suicidal/Homicidal: Nowithout intent/plan   Plan: Pt will continue in PHP while working to decrease depression symptoms and increase ability to manage symptoms as they arise.   Diagnosis: MDD (major depressive disorder), recurrent severe, without psychosis (HCC) [F33.2]    1. MDD (major depressive disorder), recurrent severe, without psychosis (HCC)       Donia GuilesJenny Taquan Bralley, LCSW 01/23/2018

## 2018-01-23 NOTE — Progress Notes (Signed)
Patient presented with appropriate affect, continued depressed mood but denied any current suicidal or homicidal ideations, no auditory or visual hallucinations and plan or intent to harm self or others at this time.  Patient admitted to "cutting" on the top of her scalp again this past Saturday night "until it bled some".  Patient with 2 areas observed in the crown of her head, one the size of a quarter and the other the size of a nickel.  One was from the previous week but both with good margins for scabbing over and no noted red streaks, heat or infection at this time. Patient reported using Neosporin on the areas to help clean and keep from getting infected.  Discussed warning signs of this and encouraged patient to follow up with a PCP if any signs of infection began.  Patient reported no self harm since that one time, that she did not inform her husband and encouraged her to use learned skills to begin putting other things in place of self harm.  Patient agreed to move scissors and sharp items out of her bathroom as admits when she gets upset with her husband or sister often at night, she then allows those things to ruminate and then reacts with self harm.  Patient discussed skills she would work on putting in the place of self harm and will keep PHP staff informed of progress.  Patient rated her current level of depression a 5, anxiety a 5 and hopelessness a 3 on a scale of 0-10 with 0 being none and 10 the worst she could manage.  Patient denied any thoughts of wanting to harm self or others at this time and agreed to contact this nurse, PHP staff and possibly even her husband if at home if any worsening of symptoms or suicidal or homicidal ideations.

## 2018-01-24 ENCOUNTER — Other Ambulatory Visit (HOSPITAL_COMMUNITY): Payer: Medicare Other | Attending: Psychiatry | Admitting: Licensed Clinical Social Worker

## 2018-01-24 ENCOUNTER — Other Ambulatory Visit (HOSPITAL_COMMUNITY): Payer: Medicare Other | Admitting: Occupational Therapy

## 2018-01-24 ENCOUNTER — Encounter (HOSPITAL_COMMUNITY): Payer: Self-pay | Admitting: Occupational Therapy

## 2018-01-24 DIAGNOSIS — F331 Major depressive disorder, recurrent, moderate: Secondary | ICD-10-CM | POA: Insufficient documentation

## 2018-01-24 DIAGNOSIS — F332 Major depressive disorder, recurrent severe without psychotic features: Secondary | ICD-10-CM

## 2018-01-24 DIAGNOSIS — Z91018 Allergy to other foods: Secondary | ICD-10-CM | POA: Insufficient documentation

## 2018-01-24 DIAGNOSIS — Z79891 Long term (current) use of opiate analgesic: Secondary | ICD-10-CM | POA: Diagnosis not present

## 2018-01-24 DIAGNOSIS — Z9889 Other specified postprocedural states: Secondary | ICD-10-CM | POA: Insufficient documentation

## 2018-01-24 DIAGNOSIS — F419 Anxiety disorder, unspecified: Secondary | ICD-10-CM | POA: Insufficient documentation

## 2018-01-24 DIAGNOSIS — F22 Delusional disorders: Secondary | ICD-10-CM | POA: Insufficient documentation

## 2018-01-24 DIAGNOSIS — F079 Unspecified personality and behavioral disorder due to known physiological condition: Secondary | ICD-10-CM

## 2018-01-24 DIAGNOSIS — Z885 Allergy status to narcotic agent status: Secondary | ICD-10-CM | POA: Diagnosis not present

## 2018-01-24 DIAGNOSIS — Z818 Family history of other mental and behavioral disorders: Secondary | ICD-10-CM | POA: Insufficient documentation

## 2018-01-24 DIAGNOSIS — Z79899 Other long term (current) drug therapy: Secondary | ICD-10-CM | POA: Diagnosis not present

## 2018-01-24 DIAGNOSIS — R4589 Other symptoms and signs involving emotional state: Secondary | ICD-10-CM

## 2018-01-24 DIAGNOSIS — F329 Major depressive disorder, single episode, unspecified: Secondary | ICD-10-CM | POA: Diagnosis present

## 2018-01-24 NOTE — Therapy (Signed)
[facility] PARTIAL HOSPITALIZATION PROGRAM [address] [place], [place], [address] Phone: [phone]   Fax:  [phone]  Occupational Therapy Treatment  Patient Details  Name: Tina Pena MRN: [id number] Date of Birth: 1956/03/03 Referring Provider: Hillery Jacks, NP   Encounter Date: 01/24/2018  OT End of Session - 01/24/18 1431    Visit Number  5    Number of Visits  16    Date for OT Re-Evaluation  02/14/18    Authorization Type  Medicare    OT Start Time  1100    OT Stop Time  1200    OT Time Calculation (min)  60 min    Activity Tolerance  Patient tolerated treatment well    Behavior During Therapy  [facility] for tasks assessed/performed       Past Medical History:  Diagnosis Date  . Bipolar disorder [facility])     Past Surgical History:  Procedure Laterality Date  . APPENDECTOMY    . GALLBLADDER SURGERY  2001  . HERNIA REPAIR    . TONSILLECTOMY  1981  . toupetfundoplicatio  2000  . TUBAL LIGATION      There were no vitals filed for this visit.  Subjective Assessment - 01/24/18 1430    Currently in Pain?  Other (Comment) chronic pain      S: "I am having some physical pain today so I need to take it easy"   O: Education and demonstration given on using yoga as a psychosocial coping strategy throughout daily routines. Yoga video used as therapeutic aide for participants to follow and incorporate into daily routine. Possible activity restrictions addressed for safety of pt in yoga practice. Opportunities for practice of yoga on the floor with a mat or in a chair per preference. Further education given on how to access various styles and types of yoga for different situations (exercise, stretch, relaxation, etc.), pt in understanding. Information given on the importance of positive self-talk, with the use of positive affirmations as a coping strategy. Meditation video with incorporated positive affirmations used as therapeutic aide to help  continue to build coping skills. Handouts given to educate on incorporating exercise into daily routine, contraindications, and places in the community to get active.   A: Pt was actively engaged in treatment this date. Pt with some yoga experience, eager to attempt this date. Pt participated in yoga while sitting in chair. Pt reported chronic pain this date at baseline, limited activity to not exacerbate pain. Pt in understanding of how to acquire different yoga practices for appropriate situation and routine. Pt exhibits understanding of the importance of positive self-talk and how positive affirmations can be a useful coping strategy to combat negative thoughts. Pt reports enjoying positive affirmation video, wishing to continue this practice in the future.  P: Pt provided with education and demonstration of yoga and using positive affirmations during meditation as an effective coping strategy. OT will continue to follow up on skills learned this date for successful implementation into daily life.                       OT Education - 01/24/18 1430    Education Details  education and demonstration given on yoga and positive affirmations as a coping strategy    Person(s) Educated  Patient    Methods  Explanation;Handout    Comprehension  Verbalized understanding       OT Short Term Goals - 01/21/18 1333  OT SHORT TERM GOAL #1   Title  Pt will recall and apply accountability skills to improve relationships when reintegrating into the community    Time  4    Period  Weeks    Status  On-going    Target Date  02/14/18      OT SHORT TERM GOAL #2   Title  Pt will be educated on strategies to improve psychosocial skills needed to participate fully in all daily, work, and leisure activities    Time  4    Period  Weeks    Status  On-going      OT SHORT TERM GOAL #3   Title  Pt will aply psychosocial skills and coping mechanisms to daily activities in order to function  independently and reintegrate into community dwelling    Time  4    Period  Weeks    Status  On-going               Plan - 01/24/18 1431    Occupational performance deficits (Please refer to evaluation for details):  ADL's;IADL's;Rest and Sleep;Work;Leisure;Social Participation       Patient will benefit from skilled therapeutic intervention in order to improve the following deficits and impairments:  Decreased coping skills, Decreased psychosocial skills, Other (comment)(decreased ability to engage in BADL and reintegrate into community)  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 MDD (major depressive disorder), recurrent episode, moderate (HCC) 01/12/2018   Dalphine HandingKaylee Daion Ginsberg, MSOT, OTR/L  [place] Hassing 01/24/2018, 2:32 PM  [facility] PART[facility] [address] [place], [place], [address] Phone: [phone]   Fax:  [phone]  Name: Tina BilletStephanie Aird MRN: [id number] Date of Birth: 10-31-1955

## 2018-01-24 NOTE — Psych (Signed)
   [facility] BH PHP THERAPIST PROGRESS NOTE  Dena BilletStephanie Wormley [id number]  Session Time: 9:00 - 11:00  Participation Level: Active  Behavioral Response: CasualAlertDepressed  Type of Therapy: Group Therapy  Treatment Goals addressed: Coping  Interventions: CBT, DBT, Solution Focused, Supportive and Reframing  Summary: Clinician led check-in regarding current stressors and situation, and review of patient completed daily inventory. Clinician utilized active listening and empathetic response and validated patient emotions. Clinician facilitated processing group on pertinent issues.   Therapist Response: Dena BilletStephanie Mcisaac is a 62 y.o. female who presents with depression and mood symptoms. Patient arrived within time allowed and reports that she is feeling "hurt." Patient rates her mood at a 5 on a scale of 1-10 with 10 being great. Pt reports continued mood lability reporting struggles to manage irritability with her husband. Pt states feeling hurt by her husband when he doesn't act the way she wants him to. Patient able to process in group. Patient engaged in discussion.      Session Time: 11:00 -12:15  Participation Level: Active  Behavioral Response: CasualAlertDepressed  Type of Therapy: Group Therapy, psychotherapy  Treatment Goals addressed: Coping  Interventions: Strengths based, reframing, Supportive,   Summary:  Spiritual Care group  Therapist Response: Patient engaged in group. See chaplain note.         Session Time: 12:15 - 1:00  Participation Level: Active  Behavioral Response: CasualAlertDepressed  Type of Therapy: Group Therapy, Activity Therapy  Treatment Goals addressed: Coping  Interventions: Psychologist, occupationalocial Skills Training, Supportive  Summary:  Reflection Group: Patients encouraged to practice skills and interpersonal techniques or work on mindfulness and relaxation techniques. The importance of self-care and making skills part of a routine to increase usage  were stressed   Therapist Response: Patient engaged and participated appropriately.       Session Time: 1:00- 2:00  Participation Level: Active  Behavioral Response: CasualAlertDepressed  Type of Therapy: Group Therapy, Psychoeducation, Activity therapy  Treatment Goals addressed: Coping  Interventions: CBT, DBT, Solution Focused, Supportive and Reframing  Summary: 12:45 - 1:50: Cln continued topic of Distress tolerance. Cln educated pt's on Self Soothe skill and how to utilize it. 1:50 -2:00 Clinician led check-out. Clinician assessed for immediate needs, medication compliance and efficacy, and safety concerns   Therapist Response: Patient engaged activity and discussion. At check-out, patient rates her mood at a 7 on a scale of 1-10 with 10 being great. Patient reports [facility]. Patient demonstrates some progress as evidenced by making connections in group discussion. Patient denies SI/HI/self-harm thoughts at the end of group    Suicidal/Homicidal: Nowithout intent/plan   Plan: Pt will continue in PHP while working to decrease depression symptoms and increase ability to manage symptoms as they arise.   Diagnosis: MDD (major depressive disorder), recurrent severe, without psychosis (HCC) [F33.2]    1. MDD (major depressive disorder), recurrent severe, without psychosis (HCC)       Donia GuilesJenny Jaxn Chiquito, LCSW 01/24/2018
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
## 2018-01-24 NOTE — Progress Notes (Signed)
Spiritual care group 01/23/2018 11:00-12:15 ?Facilitated by by Wilkie Ayehaplain Marlin Jarrard.   Spiritual care group focused on topic of "community," exploring group member's current experience of community, what they value and what they hope for in community. Group engaged in facilitated discussion and then chose pieces of art or pictures that represented their current experience of community and what they long for. Group facilitation drew on Narrative and Adlerian frameworks as well as brief CBT.  Judeth CornfieldStephanie was present throughout group.  Actively engaged in discussion and activity.  [place] and related building community anew.  Judeth CornfieldStephanie stated that she found community outside of her family and spoke with group about family history influencing what one seeks in commuinty.  Related that until recently she had not told either her husband or therapist the extent of her mental health needs.  Stated that she was able to tell psych at partial the extent of her needs and related feeling good about this.  Prior to this, spouse had accompanied her to all psych and therapist appointments.  Received affirmation from group for advocating for her needs and connecting with therapist.  Judeth CornfieldStephanie related that the group had been a sense of community for her over the past days.    [facility] Chaplain Burnis KingfisherMatthew Kaidon Kinker, MDiv, [facility]

## 2018-01-24 NOTE — Psych (Signed)
   [facility] THERAP[facility]  Dena BilletStephanie Sarinana [id number]  Session Time: 9:00 - 11:00  Participation Level: Active  Behavioral Response: CasualAlertDepressed  Type of Therapy: Group Therapy  Treatment Goals addressed: Coping  Interventions: CBT, DBT, Solution Focused, Supportive and Reframing  Summary: Clinician led check-in regarding current stressors and situation, and review of patient completed daily inventory. Clinician utilized active listening and empathetic response and validated patient emotions. Clinician facilitated processing group on pertinent issues.   Therapist Response: Dena BilletStephanie Strohm is a 62 y.o. female who presents with depression and mood symptoms. Patient arrived within time allowed and reports that she is feeling "irritable." Patient rates her mood at a 5 on a scale of 1-10 with 10 being great. Pt reports irritability is high in the morning due to her pain. Pt states she cut last night on her head "slightly" and did not attempt any skills prior to taking that step. Pt is able to recognize skills she could have used.  Patient able to process in group. Patient engaged in discussion.      Session Time: 11:00 -12:00   Participation Level: Active   Behavioral Response: CasualAlertDepressed   Type of Therapy: Group Therapy, OT   Treatment Goals addressed: Coping   Interventions: Psychosocial skills training, Supportive,    Summary:  Occupational Therapy group   Therapist Response: Patient engaged in group. See OT note.            Session Time: 12:00 - 12:45  Participation Level: Active  Behavioral Response: CasualAlertDepressed  Type of Therapy: Group Therapy, Activity Therapy  Treatment Goals addressed: Coping  Interventions: Psychologist, occupationalocial Skills Training, Supportive  Summary:  Reflection Group: Patients encouraged to practice skills and interpersonal techniques or work on mindfulness and relaxation techniques. The importance of self-care  and making skills part of a routine to increase usage were stressed   Therapist Response: Patient engaged and participated.        Session Time: 12:45- 2:00  Participation Level: Active  Behavioral Response: CasualAlertDepressed  Type of Therapy: Group Therapy, Psychoeducation; Psychotherapy  Treatment Goals addressed: Coping  Interventions: CBT; Solution focused; Supportive; Reframing  Summary: 12:45 - 1:50: Clinician continued topic of distress tolerance and educated group on ACCEPTS skill. Group members discussed examples and applications for their lives. 1:50 -2:00 Clinician led check-out. Clinician assessed for immediate needs, medication compliance and efficacy, and safety concerns   Therapist Response: Patient engaged activity and discussion. Pt able to recognize strategies for application including taking a walk and snuggling with her pets. At check-out, patient rates her mood at a 5 on a scale of 1-10 with 10 being great. [facility]  Patient demonstrates limited progress as evidenced by struggling to utilize skills. Patient denies SI/HI/self-harm at the end of group.    Suicidal/Homicidal: Nowithout intent/plan   Plan: Pt will continue in PHP while working to decrease depression symptoms and increase ability to manage symptoms as they arise.   Diagnosis: MDD (major depressive disorder), recurrent severe, without psychosis (HCC) [F33.2]    1. MDD (major depressive disorder), recurrent severe, without psychosis (HCC)       Donia GuilesJenny Vincente Asbridge, LCSW 01/24/2018

## 2018-01-25 ENCOUNTER — Telehealth (HOSPITAL_COMMUNITY): Payer: Self-pay | Admitting: Family

## 2018-01-25 ENCOUNTER — Encounter (HOSPITAL_COMMUNITY): Payer: Self-pay | Admitting: Family

## 2018-01-25 ENCOUNTER — Other Ambulatory Visit (HOSPITAL_COMMUNITY): Payer: Self-pay | Admitting: Family

## 2018-01-25 ENCOUNTER — Telehealth (HOSPITAL_COMMUNITY): Payer: Self-pay | Admitting: Professional

## 2018-01-25 ENCOUNTER — Ambulatory Visit (HOSPITAL_COMMUNITY): Payer: Self-pay | Admitting: Occupational Therapy

## 2018-01-25 NOTE — Progress Notes (Deleted)
BH MD/PA/NP PHP Progress Note  01/25/2018 9:49 AM Tina Pena  MRN:  [id number]  NP T. Sante Biedermann contacted patient Tina Pena via phone regarding  Message she left for the partial hospitalization program.  Reports she is unable to attend group session today due to a myriad of health concerns. Reports she work up experiencing  weakness, dizziness and states she is unable to "keep her eyes open."  Reports she attempted to drive to group and is currently awaiting for her husband to pick her up due to her related to her reported symptoms. Kersti denies taking any new medications.  Patient does states that she is in a safe location and has a follow-up with her rheumatologist early part of next week. Shelia reports a  history of connective tissue disorder and states her symptoms are similar to to a previous episode.  Patient was advised to follow-up with the emergency department or urgent care. Patient was receptive.Support, encouragement and reassurance was provided.    Visit Diagnosis:    ICD-10-CM   1. MDD (major depressive disorder), recurrent severe, without psychosis ([place]) F33.2     Past Psychiatric History:   Past Medical History:  Past Medical History:  Diagnosis Date  . Bipolar disorder [facility])     Past Surgical History:  Procedure Laterality Date  . APPENDECTOMY    . GALLBLADDER SURGERY  2001  . HERNIA REPAIR    . TONSILLECTOMY  1981  . toupetfundoplicatio  9924  . TUBAL LIGATION      Family Psychiatric History:   Family History:  Family History  Problem Relation Age of Onset  . Bipolar disorder Father   . Anxiety disorder Father   . Depression Father   . Paranoid behavior Father   . Bipolar disorder Maternal Grandmother   . Dementia Maternal Grandmother   . Bipolar disorder Paternal Grandmother   . Dementia Paternal Grandmother     Social History:  Social History   Socioeconomic History  . Marital status: Married    Spouse name: Not on file  .  Number of children: 2  . Years of education: Not on file  . Highest education level: Bachelor's degree (e.g., BA, AB, BS)  Occupational History  . Not on file  Social Needs  . Financial resource strain: Not hard at all  . Food insecurity:    Worry: Never true    Inability: Never true  . Transportation needs:    Medical: No    Non-medical: No  Tobacco Use  . Smoking status: Never Smoker  . Smokeless tobacco: Never Used  Substance and Sexual Activity  . Alcohol use: Yes    Alcohol/week: 0.6 oz    Types: 1 Glasses of wine per week    Comment: rare  . Drug use: Never  . Sexual activity: Yes    Partners: Male    Birth control/protection: None  Lifestyle  . Physical activity:    Days per week: 0 days    Minutes per session: 0 min  . Stress: Very much  Relationships  . Social connections:    Talks on phone: Once a week    Gets together: Once a week    Attends religious service: Never    Active member of club or organization: No    Attends meetings of clubs or organizations: Never    Relationship status: Married  Other Topics Concern  . Not on file  Social History Narrative  . Not on file    Allergies:  Allergies  Allergen Reactions  . Codeine Nausea And Vomiting  . Garlic Nausea And Vomiting  . Other Nausea And Vomiting    Onions     Metabolic Disorder Labs: No results found for: HGBA1C, MPG No results found for: PROLACTIN No results found for: CHOL, TRIG, HDL, CHOLHDL, VLDL, LDLCALC Lab Results  Component Value Date   TSH 4.510 (H) 01/12/2018    Therapeutic Level Labs: No results found for: LITHIUM Lab Results  Component Value Date   VALPROATE 43 (L) 01/16/2018   VALPROATE CANCELED 01/12/2018   No components found for:  CBMZ  Current Medications: Current Outpatient Medications  Medication Sig Dispense Refill  . clonazePAM (KLONOPIN) 1 MG tablet Take 1 tablet (1 mg total) by mouth 2 (two) times daily. 60 tablet 0  . conjugated estrogens (PREMARIN)  vaginal cream Place 1 Applicatorful vaginally daily. Twice a week    . divalproex (DEPAKOTE) 250 MG DR tablet Take 1 tablet (250 mg total) by mouth 2 (two) times daily. 1 in the AM and 2 in the PM 90 tablet 0  . DULoxetine (CYMBALTA) 60 MG capsule Take 1 capsule (60 mg total) by mouth daily. 1 tab by mouth daily 30 capsule 0  . DULoxetine HCl 40 MG CPEP Take 40 mg by mouth every evening. 30 capsule 0  . eszopiclone (LUNESTA) 1 MG TABS tablet Take 1 mg by mouth at bedtime as needed for sleep. Take immediately before bedtime as needed    . gabapentin (NEURONTIN) 300 MG capsule Take 300 mg by mouth 2 (two) times daily. 1 in the AM and 1 in the PM    . hydroxychloroquine (PLAQUENIL) 200 MG tablet Take 200 mg by mouth 2 (two) times daily. 1 tab by mouth in AM, 1 tab by mouth in PM    . Loratadine-Pseudoephedrine (CLARITIN-D 24 HOUR PO) Take by mouth. 1 tab by mouth in AM as needed    . methotrexate (RHEUMATREX) 2.5 MG tablet Take 15 mg by mouth once a week. Caution:Chemotherapy. Protect from light.    . rizatriptan (MAXALT) 5 MG tablet Take 5 mg by mouth as needed for migraine. May repeat in 2 hours if needed    . traMADol (ULTRAM) 50 MG tablet Take 50 mg by mouth every 6 (six) hours as needed for moderate pain (6-8 per day depending on pain).     No current facility-administered medications for this visit.      Musculoskeletal:   Psychiatric Specialty Exam: ROS  There were no vitals taken for this visit.There is no height or weight on file to calculate BMI.  Screenings: GAD-7     Counselor from 01/18/2018 in [place]  Total GAD-7 Score  13    PHQ2-9     Counselor from 01/18/2018 in [place]  PHQ-2 Total Score  6  PHQ-9 Total Score  20       Assessment and Plan:  Continue partial hospitalization program  Patient to update progress     Derrill [place], NP 01/25/2018, 9:49 AM

## 2018-01-28 ENCOUNTER — Encounter (HOSPITAL_COMMUNITY): Payer: Self-pay | Admitting: Occupational Therapy

## 2018-01-28 ENCOUNTER — Other Ambulatory Visit (HOSPITAL_COMMUNITY): Payer: Medicare Other | Admitting: Occupational Therapy

## 2018-01-28 ENCOUNTER — Other Ambulatory Visit (HOSPITAL_COMMUNITY): Payer: Medicare Other | Admitting: Licensed Clinical Social Worker

## 2018-01-28 ENCOUNTER — Telehealth (HOSPITAL_COMMUNITY): Payer: Self-pay | Admitting: *Deleted

## 2018-01-28 DIAGNOSIS — R4589 Other symptoms and signs involving emotional state: Secondary | ICD-10-CM

## 2018-01-28 DIAGNOSIS — F331 Major depressive disorder, recurrent, moderate: Secondary | ICD-10-CM | POA: Diagnosis not present

## 2018-01-28 DIAGNOSIS — F332 Major depressive disorder, recurrent severe without psychotic features: Secondary | ICD-10-CM

## 2018-01-28 DIAGNOSIS — F079 Unspecified personality and behavioral disorder due to known physiological condition: Secondary | ICD-10-CM

## 2018-01-28 NOTE — Therapy (Signed)
[facility] PARTIAL HOSPITALIZATION PROGRAM [address] [place], [place], [address] Phone: [phone]   Fax:  [phone]  Occupational Therapy Treatment  Patient Details  Name: Tina Pena MRN: [id number] Date of Birth: [birth year] Referring Provider: Hillery Jacks, NP   Encounter Date: 01/28/2018  OT End of Session - 01/28/18 1609    Visit Number  6    Number of Visits  16    Date for OT Re-Evaluation  02/14/18    Authorization Type  Medicare    OT Start Time  1100    OT Stop Time  1200    OT Time Calculation (min)  60 min    Activity Tolerance  Patient tolerated treatment well    Behavior During Therapy  [facility] for tasks assessed/performed       Past Medical History:  Diagnosis Date  . Bipolar disorder Tina Pena)     Past Surgical History:  Procedure Laterality Date  . APPENDECTOMY    . GALLBLADDER SURGERY  2001  . HERNIA REPAIR    . TONSILLECTOMY  1981  . toupetfundoplicatio  2000  . TUBAL LIGATION      There were no vitals filed for this visit.  Subjective Assessment - 01/28/18 1609    Currently in Pain?  Other (Comment) chronic pain      S: "Last time I suppressed my emotions I did a horrible thing"   O: Stress management group completed to use as productive coping strategy, to help mitigate maladaptive coping to integrate in functional BADL/IADL. Education given on the definition of stress and its cognitive, behavioral, emotional, and physical effects on the body. Stress symptom checklist completed to raise insight on current symptoms felt with stress. Stress management tool worksheet begun, to continue next treatment date. Mood tracker worksheet given to implement into daily routine.  A: Pt presents to group with flat affect, engaged and participatory with facilitator and other group members this date. Pt completed stress symptom checklist, reporting pt has moderate stress levels. Pt with extremely poor insight, making score of  assessment questionable (pt currently self harms). Stress management tools worksheet initiated, pt stating last time she suppressed her emotions she did a "terrible thing" and knows she needs to manage stress more effectively. Pt eager to implement mood tracker this date.  P: Pt provided with education on stress management activities. OT will continue to follow up with activities learned for successful implementation into daily life                      OT Education - 01/28/18 1609    Education Details  educaiton given on implementation of stress management strategies to daily routine    Person(s) Educated  Patient    Methods  Explanation;Handout    Comprehension  Verbalized understanding       OT Short Term Goals - 01/21/18 1333      OT SHORT TERM GOAL #1   Title  Pt will recall and apply accountability skills to improve relationships when reintegrating into the community    Time  4    Period  Weeks    Status  On-going    Target Date  02/14/18      OT SHORT TERM GOAL #2   Title  Pt will be educated on strategies to improve psychosocial skills needed to participate fully in all daily, work, and leisure activities    Time  4    Period  Weeks  Status  On-going      OT SHORT TERM GOAL #3   Title  Pt will aply psychosocial skills and coping mechanisms to daily activities in order to function independently and reintegrate into community dwelling    Time  4    Period  Weeks    Status  On-going               Plan - 01/28/18 1609    Occupational performance deficits (Please refer to evaluation for details):  ADL's;IADL's;Rest and Sleep;Work;Leisure;Social Participation       Patient will benefit from skilled therapeutic intervention in order to improve the following deficits and impairments:  Decreased coping skills, Decreased psychosocial skills, Other (comment)(decreased ability to engage in BADL and reintegrate into community)  Visit  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 MDD (major depressive disorder), recurrent episode, moderate (HCC) 01/12/2018   Dalphine HandingKaylee Rider Ermis, MSOT, OTR/L   LeolaKaylee Cresencio Reesor 01/28/2018, 4:10 PM  [facility] PART[facility] H[facility] PROGRAM [address] [place], [place], [address] Phone: [phone]   Fax:  [phone]  Name: Tina BilletStephanie Pena MRN: [id number] Date of Birth: 23-Feb-1956

## 2018-01-28 NOTE — Telephone Encounter (Signed)
Dr Vanetta ShawlHisada Patient LVM stating that since office visit & he med's Cymbalta & Clonazepam were increased she's been having   problems staying alert. She couldn't drive & go to class on last week due to she couldn't get her eyes to open  " in awake state" they kept closing & she wasn't alert -- felt very groggy.

## 2018-01-28 NOTE — Telephone Encounter (Signed)
LVM per provider : Advise her to take clonazepam 1 mg twice a day (this is as needed- if she feels very drowsy, she may even skip the dose) and see if it helps her drowsiness.

## 2018-01-28 NOTE — Telephone Encounter (Signed)
Advise her to take clonazepam 1 mg twice a day (this is as needed- if she feels very drowsy, she may even skip the dose) and see if it helps her drowsiness.

## 2018-01-29 ENCOUNTER — Encounter (HOSPITAL_COMMUNITY): Payer: Self-pay | Admitting: Occupational Therapy

## 2018-01-29 ENCOUNTER — Other Ambulatory Visit (HOSPITAL_COMMUNITY): Payer: Medicare Other | Admitting: Occupational Therapy

## 2018-01-29 ENCOUNTER — Other Ambulatory Visit (HOSPITAL_COMMUNITY): Payer: Medicare Other | Admitting: Licensed Clinical Social Worker

## 2018-01-29 DIAGNOSIS — F079 Unspecified personality and behavioral disorder due to known physiological condition: Secondary | ICD-10-CM

## 2018-01-29 DIAGNOSIS — R4589 Other symptoms and signs involving emotional state: Secondary | ICD-10-CM

## 2018-01-29 DIAGNOSIS — F331 Major depressive disorder, recurrent, moderate: Secondary | ICD-10-CM | POA: Diagnosis not present

## 2018-01-29 DIAGNOSIS — F332 Major depressive disorder, recurrent severe without psychotic features: Secondary | ICD-10-CM

## 2018-01-29 NOTE — Psych (Signed)
   [facility] TH[facility]  Dena BilletStephanie Kivett [id number]  Session Time: 9:00 - 11:00  Participation Level: Active  Behavioral Response: CasualAlertDepressed  Type of Therapy: Group Therapy  Treatment Goals addressed: Coping  Interventions: CBT, DBT, Solution Focused, Supportive and Reframing  Summary: Clinician led check-in regarding current stressors and situation, and review of patient completed daily inventory. Clinician utilized active listening and empathetic response and validated patient emotions. Clinician facilitated processing group on pertinent issues.   Therapist Response: Dena BilletStephanie Morrell is a 62 y.o. female who presents with depression and mood symptoms. Patient arrived within time allowed and reports that she is feeling "hurt." Patient rates her mood at a 5 on a scale of 1-10 with 10 being great. Pt reports she is struggling to process everything from group. Pt laments not having better support as she does well with verbal processing. Pt states she is reviewing old journals as she is preoccupied with why her mental health struggles began and is looking to see if she can determine answers.  Patient able to process in group. Patient engaged in discussion.      Session Time: 11:00 -12:00   Participation Level: Active   Behavioral Response: CasualAlertDepressed   Type of Therapy: Group Therapy, OT   Treatment Goals addressed: Coping   Interventions: Psychosocial skills training, Supportive,    Summary:  Occupational Therapy group   Therapist Response: Patient engaged in group. See OT note.          Session Time: 12:00 - 12:45  Participation Level: Active  Behavioral Response: CasualAlertDepressed  Type of Therapy: Group Therapy, Activity Therapy  Treatment Goals addressed: Coping  Interventions: Psychologist, occupationalocial Skills Training, Supportive  Summary:  Reflection Group: Patients encouraged to practice skills and interpersonal techniques or work on  mindfulness and relaxation techniques. The importance of self-care and making skills part of a routine to increase usage were stressed   Therapist Response: Patient engaged and participated.     Session Time: 12:45 - 2:00   Participation Level: Active   Behavioral Response: CasualAlertDepressed   Type of Therapy: Group Therapy, Psychotherapy; Psychoeducation   Treatment Goals addressed: Coping   Interventions: CBT, Solution focused, Supportive, Reframing   Summary: 12:45 - 1:50 Cln introduced topic of boundaries. Cln provided psychoeducation on what boundaries are, porous, rigid and healthy boundary characteristics, and the different types of boundaries. Group related the information to themselves to begin discovering potential boundary issues.  1:50 -2:00 Clinician led check-out. Clinician assessed for immediate needs, medication compliance and efficacy, and safety concerns      Therapist Response: Patient engaged in group. Pt reports understanding of boundaries and shares she has mainly porous boundaries.  At check-out, patient rates her mood at a 6 on a scale of 1-10 with 10 being great. Pt reports afternoon plans of errands and a nap.  Patient demonstrates some progress as evidenced by attempting to practice skills at home. Patient denies SI/HI/self-harm thoughts at the end of group.      Suicidal/Homicidal: Nowithout intent/plan   Plan: Pt will continue in PHP while working to decrease depression symptoms and increase ability to manage symptoms as they arise.   Diagnosis: MDD (major depressive disorder), recurrent severe, without psychosis (HCC) [F33.2]    1. MDD (major depressive disorder), recurrent severe, without psychosis (HCC)       Donia GuilesJenny Inocente Krach, LCSW 01/29/2018

## 2018-01-29 NOTE — Therapy (Signed)
[facility] PARTIAL HOSPITALIZATION PROGRAM [address] [place], [place], [address] Phone: [phone]   Fax:  [phone]  Occupational Therapy Treatment  Patient Details  Name: Tina Pena MRN: [id number] Date of Birth: [birth year] Referring Provider: Hillery Jacks, NP   Encounter Date: 01/29/2018  OT End of Session - 01/29/18 1334    Visit Number  7    Number of Visits  16    Date for OT Re-Evaluation  02/14/18    Authorization Type  Medicare    OT Start Time  1100    OT Stop Time  1200    OT Time Calculation (min)  60 min    Activity Tolerance  Patient tolerated treatment well    Behavior During Therapy  [facility] for tasks assessed/performed       Past Medical History:  Diagnosis Date  . Bipolar disorder [facility])     Past Surgical History:  Procedure Laterality Date  . APPENDECTOMY    . GALLBLADDER SURGERY  2001  . HERNIA REPAIR    . TONSILLECTOMY  1981  . toupetfundoplicatio  2000  . TUBAL LIGATION      There were no vitals filed for this visit.  Subjective Assessment - 01/29/18 1333    Currently in Pain?  Other (Comment) chronic pain        S: "I need to keep working on my conflict resolution"   O: Stress management tool worksheet discussed to educate on unhealthy vs healthy coping skills to manage stress to improve community integration. Coping strategies taught include: relaxation based- deep breathing, counting to 10, taking a 1 minute vacation, acceptance, stress balls, relaxation audio/video, visual/mental imagery. Positive mental attitude- gratitude, acceptance, cognitive reframing, positive self talk, anger management. Relaxation techniques handout given to apply to BADL routine.  A: Pt presents to group with blunted affect, engaged and participatory with facilitator and other group members this date. Stress management tools worksheet completed, with pt identifying that she needs to continuously work on her conflict resolution.  Pt states she has a history of lying and complicating her family relationships, and needs to engage in a lot of conflict resolution/apologies this date. Pt in understanding of how to implement relaxation handouts.  P: Pt provided with education on stress management activities. OT will continue to follow up with activities learned for successful implementation into daily life                     OT Education - 01/29/18 1333    Education Details  education given on implementation of stress management strategies when reintegrating into community    Person(s) Educated  Patient    Methods  Explanation;Handout    Comprehension  Verbalized understanding       OT Short Term Goals - 01/21/18 1333      OT SHORT TERM GOAL #1   Title  Pt will recall and apply accountability skills to improve relationships when reintegrating into the community    Time  4    Period  Weeks    Status  On-going    Target Date  02/14/18      OT SHORT TERM GOAL #2   Title  Pt will be educated on strategies to improve psychosocial skills needed to participate fully in all daily, work, and leisure activities    Time  4    Period  Weeks    Status  On-going      OT SHORT TERM GOAL #3  Title  Pt will aply psychosocial skills and coping mechanisms to daily activities in order to function independently and reintegrate into community dwelling    Time  4    Period  Weeks    Status  On-going               Plan - 01/29/18 1334    Occupational performance deficits (Please refer to evaluation for details):  ADL's;IADL's;Rest and Sleep;Work;Leisure;Social Participation       Patient will benefit from skilled therapeutic intervention in order to improve the following deficits and impairments:  Decreased coping skills, Decreased psychosocial skills, Other (comment)(decreased ability to engage in BADL and reintegrate into community)  Visit Diagnosis: Personality and behavioral disorder due to known  physiological condition  Difficulty coping    Problem List Patient Active Problem List   Diagnosis Date Noted  . MDD (major depressive disorder), recurrent episode, moderate (HCC) 01/12/2018   Dalphine HandingKaylee Cathern Tahir, MSOT, OTR/L  SloanKaylee Amisha Pospisil 01/29/2018, 1:44 PM  [facility] PARTIAL HOSPITAL[facility]ATION PROGRAM [address] [place], [place], [address] Phone: [phone]   Fax:  [phone]  Name: Dena BilletStephanie Estrella MRN: [id number] Date of Birth: 05-04-1956

## 2018-01-30 ENCOUNTER — Encounter (HOSPITAL_COMMUNITY): Payer: Self-pay | Admitting: Family

## 2018-01-30 ENCOUNTER — Other Ambulatory Visit (HOSPITAL_COMMUNITY): Payer: Medicare Other | Admitting: Licensed Clinical Social Worker

## 2018-01-30 VITALS — BP 104/65 | HR 84 | Resp 18 | Ht 68.0 in | Wt 161.4 lb

## 2018-01-30 DIAGNOSIS — F331 Major depressive disorder, recurrent, moderate: Secondary | ICD-10-CM | POA: Diagnosis not present

## 2018-01-30 DIAGNOSIS — F332 Major depressive disorder, recurrent severe without psychotic features: Secondary | ICD-10-CM

## 2018-01-30 NOTE — Progress Notes (Signed)
BH MD/PA/NP PHP Progress Note  01/30/2018 10:16 AM Trissa Molina  MRN:  [id number]  Evaluation:  Areil is awake alert oriented x3.  Presents pleasant, clam and  cooperative during this assessment.  Was reported during treatment team patient continues to show progression throughout partial hospitalization programming.  She denies homicidal or suicidal ideations.  Reports she is attempting to set boundaries with her husband and is becoming more open with sharing her feelings and thoughts.  Discussed patient attending/ steeping down to (IOP) intensive outpatient treatment after discharge of partial hospitalization (PHP). Stephaine was agreeable however reports she is having hand surgery and will follow-up after release from her surgery Jenise reports she feels as if she had medication reaction/side effects to  the Cymbalta. States he contacted her primary care provider and medications was adjusted. Katelind reports taking Cymbalta, Neurontin and Klonopin as she reports her mood has improved.  Rates her depression 5 out of 10 with 10 being the worst during this assessment reports her sleep has improved.  Patient continues to show improvement while in group sessions and her daily life. Support encouragement reassurance was provided   HPI: Patient is a 62 y.o. Stephaine female presents with depression.  Reports she felt life her life "started to come apart after a suicide attempt "with cutting herself all over with a box cutter. States she was then charged with filling a false police reports and has been fighting a Administrator, sports.  States she has not disclosed this information to her husband as she reports her husband is a unsupportive regarding her mental health issues.  Reports diagnosis of bipolar, depression, anxiety and paranoia.  patient is currently denying suicidal homicidal ideations.  Reports she is followed by MD Hisada.  Reports she is prescribed gabapentin 300 mg, Duloxetine 40 mg,  clonazepam 1 mg PRN, reports she was recently initiated on Depakote 250 mg p.o. twice daily.  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  Patient was enrolled in partial psychiatric program on 01/18/18.        Visit Diagnosis:    ICD-10-CM   1. MDD (major depressive disorder), recurrent episode, moderate (HCC) F33.1     Past Psychiatric History:   Past Medical History:  Past Medical History:  Diagnosis Date  . Bipolar disorder [facility])     Past Surgical History:  Procedure Laterality Date  . APPENDECTOMY    . GALLBLADDER SURGERY  2001  . HERNIA REPAIR    . TONSILLECTOMY  1981  . toupetfundoplicatio  2000  . TUBAL LIGATION      Family Psychiatric History:   Family History:  Family History  Problem Relation Age of Onset  . Bipolar disorder Father   . Anxiety disorder Father   . Depression Father   . Paranoid behavior Father   . Bipolar disorder Maternal Grandmother   . Dementia Maternal Grandmother   . Bipolar disorder Paternal Grandmother   . Dementia Paternal Grandmother     Social History:  Social History   Socioeconomic History  . Marital status: Married    Spouse name: Not on file  . Number of children: 2  . Years of education: Not on file  . Highest education level: Bachelor's degree (e.g., BA, AB, BS)  Occupational History  . Not on file  Social Needs  . Financial resource strain: Not hard at all  . Food insecurity:    Worry: Never true    Inability: Never true  . Transportation needs:  Medical: No    Non-medical: No  Tobacco Use  . Smoking status: Never Smoker  . Smokeless tobacco: Never Used  Substance and Sexual Activity  . Alcohol use: Yes    Alcohol/week: 0.6 oz    Types: 1 Glasses of wine per week    Comment: rare  . Drug use: Never  . Sexual activity: Yes    Partners: Male    Birth control/protection: None  Lifestyle  . Physical activity:    Days per week: 0 days    Minutes per session: 0 min  .  Stress: Very much  Relationships  . Social connections:    Talks on phone: Once a week    Gets together: Once a week    Attends religious service: Never    Active member of club or organization: No    Attends meetings of clubs or organizations: Never    Relationship status: Married  Other Topics Concern  . Not on file  Social History Narrative  . Not on file    Allergies:  Allergies  Allergen Reactions  . Codeine Nausea And Vomiting  . Garlic Nausea And Vomiting  . Other Nausea And Vomiting    Onions     Metabolic Disorder Labs: No results found for: HGBA1C, MPG No results found for: PROLACTIN No results found for: CHOL, TRIG, HDL, CHOLHDL, VLDL, LDLCALC Lab Results  Component Value Date   TSH 4.510 (H) 01/12/2018    Therapeutic Level Labs: No results found for: LITHIUM Lab Results  Component Value Date   VALPROATE 43 (L) 01/16/2018   VALPROATE CANCELED 01/12/2018   No components found for:  CBMZ  Current Medications: Current Outpatient Medications  Medication Sig Dispense Refill  . clonazePAM (KLONOPIN) 1 MG tablet Take 1 tablet (1 mg total) by mouth 2 (two) times daily. 60 tablet 0  . conjugated estrogens (PREMARIN) vaginal cream Place 1 Applicatorful vaginally daily. Twice a week    . divalproex (DEPAKOTE) 250 MG DR tablet Take 1 tablet (250 mg total) by mouth 2 (two) times daily. 1 in the AM and 2 in the PM 90 tablet 0  . DULoxetine (CYMBALTA) 60 MG capsule Take 1 capsule (60 mg total) by mouth daily. 1 tab by mouth daily 30 capsule 0  . DULoxetine HCl 40 MG CPEP Take 40 mg by mouth every evening. 30 capsule 0  . eszopiclone (LUNESTA) 1 MG TABS tablet Take 1 mg by mouth at bedtime as needed for sleep. Take immediately before bedtime as needed    . gabapentin (NEURONTIN) 300 MG capsule Take 300 mg by mouth 2 (two) times daily. 1 in the AM and 1 in the PM    . hydroxychloroquine (PLAQUENIL) 200 MG tablet Take 200 mg by mouth 2 (two) times daily. 1 tab by mouth in  AM, 1 tab by mouth in PM    . Loratadine-Pseudoephedrine (CLARITIN-D 24 HOUR PO) Take by mouth. 1 tab by mouth in AM as needed    . methotrexate (RHEUMATREX) 2.5 MG tablet Take 15 mg by mouth once a week. Caution:Chemotherapy. Protect from light.    . rizatriptan (MAXALT) 5 MG tablet Take 5 mg by mouth as needed for migraine. May repeat in 2 hours if needed    . traMADol (ULTRAM) 50 MG tablet Take 50 mg by mouth every 6 (six) hours as needed for moderate pain (6-8 per day depending on pain).     No current facility-administered medications for this visit.  Musculoskeletal: Strength & Muscle Tone: within normal limits Gait & Station: normal Patient leans: N/A  Psychiatric Specialty Exam: ROS  There were no vitals taken for this visit.There is no height or weight on file to calculate BMI.  General Appearance: Casual  Eye Contact:  Fair  Speech:  Clear and Coherent  Volume:  Normal  Mood:  Anxious and Depressed  Affect:  Congruent  Thought Process:  Coherent  Orientation:  Full (Time, Place, and Person)  Thought Content: Hallucinations: None   Suicidal Thoughts:  No  Homicidal Thoughts:  No  Memory:  Immediate;   Fair Recent;   Fair Remote;   Fair  Judgement:  Fair  Insight:  Fair  Psychomotor Activity:  Normal  Concentration:  Concentration: Fair  Recall:  [employer] of Knowledge: Fair  Language: Fair  Akathisia:  No  Handed:  Right  AIMS (if indicated):   Assets:  Communication Skills Desire for Improvement Resilience Social Support  ADL's:  Intact  Cognition: WNL  Sleep:  Fair   Screenings: GAD-7     Counselor from 01/18/2018 in BEHAVIORAL HEALTH PARTIAL HOSPITALIZATION PROGRAM  Total GAD-7 Score  13    PHQ2-9     Counselor from 01/18/2018 in BEHAVIORAL HEALTH PARTIAL HOSPITALIZATION PROGRAM  PHQ-2 Total Score  6  PHQ-9 Total Score  20       Assessment and Plan:  Continue partial hospitalization program (PHP) Continue occupational therapy  (OT) Continue taking medications as prescribed:  Continue Depakote 250 twice daily  Continue Cymbalta 60 mg daily  Continue Neurontin 300 twice daily   Treatment plan was reviewed and agreed upon by NP T. Melvyn Neth and patient Bridgitt Raggio need for continued group.   Oneta Rack, NP 01/30/2018, 10:16 AM

## 2018-01-30 NOTE — Progress Notes (Signed)
Patient presents with appropriate mood an affect. States that she fell this morning in her kitchen and hurt her right thumb. Pain scale rating 8. Able to bend it, no swelling noted or bruising. Has some pain in right knee as well. Has surgery scheduled for next week for her left thumb to replace joint with [place] [facility].  Had a medication change last week with Cymbalta. Dosage in the evening was causing sedation so it was decreased from 40 mg to 20 mg in the evening. This helps with her anxiety as well. Denies hopelessness, no SI/HI or AV hallucinations. Has a supportive husband and feels safe at home. Has had no cutting behaviors recently and keeps all sharps out of reach. Feels that the program is helping her a lot. Smiling and laughing when speaking of her daughter and grandchildren.

## 2018-01-30 NOTE — Psych (Signed)
   [name]  Tina BilletStephanie Berdan [id number]  Session Time: 9:00 - 11:00  Participation Level: Active  Behavioral Response: CasualAlertDepressed  Type of Therapy: Group Therapy  Treatment Goals addressed: Coping  Interventions: CBT, DBT, Solution Focused, Supportive and Reframing  Summary: Clinician led check-in regarding current stressors and situation, and review of patient completed daily inventory. Clinician utilized active listening and empathetic response and validated patient emotions. Clinician facilitated processing group on pertinent issues.   Therapist Response: Tina BilletStephanie Pena is a 62 y.o. female who presents with depression and mood symptoms. Patient arrived within time allowed and reports that she is feeling "moody." Patient rates her mood at a 7 on a scale of 1-10 with 10 being great. Pt reports she is working on the way she speaks to her husband and is seeing success. Pt reports she popped her knee out and talked to friends last night. Patient able to process in group. Patient engaged in discussion.      Session Time: 11:00 -12:00   Participation Level: Active   Behavioral Response: CasualAlertDepressed   Type of Therapy: Group Therapy, OT   Treatment Goals addressed: Coping   Interventions: Psychosocial skills training, Supportive,    Summary:  Occupational Therapy group   Therapist Response: Patient engaged in group. See OT note.          Session Time: 12:00 - 12:45  Participation Level: Active  Behavioral Response: CasualAlertDepressed  Type of Therapy: Group Therapy, Activity Therapy  Treatment Goals addressed: Coping  Interventions: Psychologist, occupationalocial Skills Training, Supportive  Summary:  Reflection Group: Patients encouraged to practice skills and interpersonal techniques or work on mindfulness and relaxation techniques. The importance of self-care and making skills part of a routine to increase usage were stressed    Therapist Response: Patient engaged and participated.     Session Time: 12:45 - 2:00  Participation Level: Alert  Behavioral Response: CasualAlertDepressed  Type of Therapy: Group Therapy, Psychoeducation; Psychotherapy  Treatment Goals addressed: Coping  Interventions: CBT; Solution focused; Supportive; Reframing  Summary: 12:45 - 1:50: Cln facilitated boundaries workshop. Group members discussed boundary issues from their lives and group provided feedback and problem solving based on boundary education.  1:50 -2:00 Clinician led check-out. Clinician assessed for immediate needs, medication compliance and efficacy, and safety concerns   Therapist Response: Patient engaged in activity. Pt shared boundary issues with her partner and daughter and struggling to control. Pt accepted and provided feedback and reports increased understanding of how to set and maintain boundaries.  At check-out, patient rates her mood at a 7 on a scale of 1-10 with 10 being great. Pt reports afternoon plans of running errands. Patient demonstrates some progress as evidenced by setting more boundaries with husband. Patient denies SI/HI/self-harm thoughts at the end of group.        Suicidal/Homicidal: Nowithout intent/plan   Plan: Pt will continue in PHP while working to decrease depression symptoms and increase ability to manage symptoms as they arise.   Diagnosis: MDD (major depressive disorder), recurrent severe, without psychosis (HCC) [F33.2]    1. MDD (major depressive disorder), recurrent severe, without psychosis (HCC)       Donia GuilesJenny Tennile Styles, LCSW 01/30/2018

## 2018-01-30 NOTE — Psych (Signed)
   [facility]  Tina BilletStephanie Pena [id number]  Session Time: 9:00 - 11:00  Participation Level: Active  Behavioral Response: CasualAlertDepressed  Type of Therapy: Group Therapy  Treatment Goals addressed: Coping  Interventions: CBT, DBT, Solution Focused, Supportive and Reframing  Summary: Clinician led check-in regarding current stressors and situation, and review of patient completed daily inventory. Clinician utilized active listening and empathetic response and validated patient emotions. Clinician facilitated processing group on pertinent issues.   Therapist Response: Tina BilletStephanie Bursch is a 62 y.o. female who presents with depression and mood symptoms. Patient arrived within time allowed and reports that she is feeling "more awake." Patient rates her mood at a 6 on a scale of 1-10 with 10 being great. Pt reports her psychiatrist took her off Cymbalta as she feels it had been making her confused and pt notices a difference. Pt states she spent the weekend doing things around the house. Pt reports having rumination about upcoming surgery and the recovery process.  Patient able to process in group. Patient engaged in discussion.      Session Time: 11:00 -12:00   Participation Level: Active   Behavioral Response: CasualAlertDepressed   Type of Therapy: Group Therapy, OT   Treatment Goals addressed: Coping   Interventions: Psychosocial skills training, Supportive,    Summary:  Occupational Therapy group   Therapist Response: Patient engaged in group. See OT note.          Session Time: 12:00 - 12:45  Participation Level: Active  Behavioral Response: CasualAlertDepressed  Type of Therapy: Group Therapy, Activity Therapy  Treatment Goals addressed: Coping  Interventions: Psychologist, occupationalocial Skills Training, Supportive  Summary:  Reflection Group: Patients encouraged to practice skills and interpersonal techniques or work on mindfulness and relaxation  techniques. The importance of self-care and making skills part of a routine to increase usage were stressed   Therapist Response: Patient engaged and participated.     Session Time: 12:45 - 2:00  Participation Level: Alert  Behavioral Response: CasualAlertDepressed  Type of Therapy: Group Therapy, Psychoeducation; Psychotherapy  Treatment Goals addressed: Coping  Interventions: CBT; Solution focused; Supportive; Reframing  Summary: 12:45 - 1:50: Cln discussed how to set and maintain boundaries. Group reviewed handout "How to set boundaries" and walked through steps together.  1:50 -2:00 Clinician led check-out. Clinician assessed for immediate needs, medication compliance and efficacy, and safety concerns   Therapist Response: Patient engaged in activity. Pt reports understanding of how to set boundaries. At check-out, patient rates her mood at a 7 on a scale of 1-10 with 10 being great. Pt reports afternoon plans of running errands and talking to her husband. Patient demonstrates some progress as evidenced by applying topic of boundaries to her life. Patient denies SI/HI/self-harm thoughts at the end of group.      Suicidal/Homicidal: Nowithout intent/plan   Plan: Pt will continue in PHP while working to decrease depression symptoms and increase ability to manage symptoms as they arise.   Diagnosis: MDD (major depressive disorder), recurrent severe, without psychosis (HCC) [F33.2]    1. MDD (major depressive disorder), recurrent severe, without psychosis (HCC)       Donia GuilesJenny Tredarius Cobern, LCSW 01/30/2018

## 2018-01-31 ENCOUNTER — Encounter (HOSPITAL_COMMUNITY): Payer: Self-pay | Admitting: Occupational Therapy

## 2018-01-31 ENCOUNTER — Other Ambulatory Visit (HOSPITAL_COMMUNITY): Payer: Medicare Other | Admitting: Licensed Clinical Social Worker

## 2018-01-31 ENCOUNTER — Other Ambulatory Visit (HOSPITAL_COMMUNITY): Payer: Medicare Other | Admitting: Occupational Therapy

## 2018-01-31 DIAGNOSIS — F332 Major depressive disorder, recurrent severe without psychotic features: Secondary | ICD-10-CM

## 2018-01-31 DIAGNOSIS — F079 Unspecified personality and behavioral disorder due to known physiological condition: Secondary | ICD-10-CM

## 2018-01-31 DIAGNOSIS — R4589 Other symptoms and signs involving emotional state: Secondary | ICD-10-CM

## 2018-01-31 DIAGNOSIS — F331 Major depressive disorder, recurrent, moderate: Secondary | ICD-10-CM | POA: Diagnosis not present

## 2018-01-31 NOTE — Psych (Signed)
   [facility]  Tina BilletStephanie Pena [id number]  Session Time: 9:00 - 11:00  Participation Level: Active  Behavioral Response: CasualAlertDepressed  Type of Therapy: Group Therapy  Treatment Goals addressed: Coping  Interventions: CBT, DBT, Solution Focused, Supportive and Reframing  Summary: Clinician led check-in regarding current stressors and situation, and review of patient completed daily inventory. Clinician utilized active listening and empathetic response and validated patient emotions. Clinician facilitated processing group on pertinent issues.   Therapist Response: Tina BilletStephanie Pena is a 62 y.o. female who presents with depression and mood symptoms. Patient arrived within time allowed and reports that she is feeling "frustrated." Patient rates her mood at a 5 on a scale of 1-10 with 10 being great. Pt reports she fell this morning. Pt is tearful and states annoyance and frustration with her pain and physical limitations. Pt shares she feels her mental health would improve if she did nto struggle with physical issues. Patient able to process in group. Patient engaged in discussion.      Session Time: 11:00 -12:15  Participation Level: Active  Behavioral Response: CasualAlertDepressed  Type of Therapy: Group Therapy, psychotherapy  Treatment Goals addressed: Coping  Interventions: Strengths based, reframing, Supportive,   Summary:  Spiritual Care group  Therapist Response: Patient engaged in group. See chaplain note.         Session Time: 12:15 - 1:00  Participation Level: Active  Behavioral Response: CasualAlertDepressed  Type of Therapy: Group Therapy, Activity Therapy  Treatment Goals addressed: Coping  Interventions: Psychologist, occupationalocial Skills Training, Supportive  Summary:  Reflection Group: Patients encouraged to practice skills and interpersonal techniques or work on mindfulness and relaxation techniques. The importance of self-care and  making skills part of a routine to increase usage were stressed   Therapist Response: Patient engaged and participated appropriately.        Session Time: 1:00- 2:00  Participation Level: Active  Behavioral Response: CasualAlertDepressed  Type of Therapy: Group Therapy, Psychoeducation, Activity therapy  Treatment Goals addressed: Coping  Interventions: relaxation training; Supportive; Reframing  Summary: 12:45 - 1:50: Relaxation group: Cln led group focused on retraining the body's response to stress.   1:50 -2:00 Clinician led check-out. Clinician assessed for immediate needs, medication compliance and efficacy, and safety concerns   Therapist Response: Patient engaged in activity and discussion. At check-out, patient rates her mood at a 7 on a scale of 1-10 with 10 being great. Patient reports afternoon plans of seeing her doctor to make sure she did not hurt her hand when she fell.  Patient demonstrates some progress as evidenced by setting a boundary with husband last night. Patient denies SI/HI/self-harm thoughts at the end of group.      Suicidal/Homicidal: Nowithout intent/plan   Plan: Pt will continue in PHP while working to decrease depression symptoms and increase ability to manage symptoms as they arise.   Diagnosis: MDD (major depressive disorder), recurrent severe, without psychosis (HCC) [F33.2]    1. MDD (major depressive disorder), recurrent severe, without psychosis (HCC)       Tina GuilesJenny Fintan Grater, LCSW 01/31/2018

## 2018-01-31 NOTE — Therapy (Signed)
[facility] [address] [place], [place], [address] Phone: [phone]   Fax:  [phone]  Occupational Therapy Treatment  Patient Details  Name: Tina BilletStephanie Pena MRN: [id number] Date of Birth: August 12, 1955 Referring Provider: Hillery Jacksanika Lewis, NP   Encounter Date: 01/31/2018  OT End of Session - 01/31/18 1412    Visit Number  8    Number of Visits  16    Date for OT Re-Evaluation  02/14/18    Authorization Type  Medicare    OT Start Time  1105    OT Stop Time  1205    OT Time Calculation (min)  60 min    Activity Tolerance  Patient tolerated treatment well    Behavior During Therapy  [facility] for tasks assessed/performed       Past Medical History:  Diagnosis Date  . Bipolar disorder [facility](HCC)     Past Surgical History:  Procedure Laterality Date  . APPENDECTOMY    . GALLBLADDER SURGERY  2001  . HERNIA REPAIR    . TONSILLECTOMY  1981  . toupetfundoplicatio  2000  . TUBAL LIGATION      There were no vitals filed for this visit.  Subjective Assessment - 01/31/18 1407    Currently in Pain?  Other (Comment)   reports chronic pain         S: "I wish I could be more active"  ?  O: Education given on self care and its importance in regular BADL/IADL routine. Pt completed self care assessment to identify areas of strength and weakness. Self care assessments covered areas of physical health, psychological health, spiritual health, and professional health. Pt asked to identifies area of weakness within each area and develop plans for improvement this date. Pt encouraged to brainstorm with other peers to begin goal setting in areas of desired change.  ?  A: Pt presents to group with animated affect, engaged and participatory throughout session. Self care assessment completed, pt stating she identified several areas to improve. She focused mostly on physical health and social health. Pt with many physical conditions at baseline,  with chronic pain and upcoming surgeries. She wants to focus on maintaining her health through these conditions. Pt also wanting to engage in social activities, specifically joining a book club to improve her socialization this date. ?  P: Pt educated on importance of self care in BADL/IADL routine. OT will continue to follow up with pt each treatment session to ensure carryover into daily routine to facilitate successful community integration. OT treatment will be 4 times a week                   OT Education - 01/31/18 1408    Education Details  education given on importance of integrating self care into daily routine    Person(s) Educated  Patient    Methods  Explanation;Handout    Comprehension  Verbalized understanding       OT Short Term Goals - 01/21/18 1333      OT SHORT TERM GOAL #1   Title  Pt will recall and apply accountability skills to improve relationships when reintegrating into the community    Time  4    Period  Weeks    Status  On-going    Target Date  02/14/18      OT SHORT TERM GOAL #2   Title  Pt will be educated on strategies to improve psychosocial skills needed to participate fully in  all daily, work, and leisure activities    Time  4    Period  Weeks    Status  On-going      OT SHORT TERM GOAL #3   Title  Pt will aply psychosocial skills and coping mechanisms to daily activities in order to function independently and reintegrate into community dwelling    Time  4    Period  Weeks    Status  On-going               Plan - 01/31/18 1412    Occupational performance deficits (Please refer to evaluation for details):  ADL's;IADL's;Rest and Sleep;Work;Leisure;Social Participation       Patient will benefit from skilled therapeutic intervention in order to improve the following deficits and impairments:  Decreased coping skills, Decreased psychosocial skills, Other (comment)(decreased ability to engage in BADL and reintegrate into the  community)  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 MDD (major depressive disorder), recurrent episode, moderate (HCC) 01/12/2018   Dalphine Handing, MSOT, OTR/L  [place] 01/31/2018, 2:13 PM  [facility] PARTIAL HOSPITALIZATION PROGRAM [address] [place], [place], [address] Phone: [phone]   Fax:  [phone]  Name: Tina Pena MRN: [id number] Date of Birth: 15-Aug-1955

## 2018-02-01 ENCOUNTER — Other Ambulatory Visit (HOSPITAL_COMMUNITY): Payer: Medicare Other | Admitting: Licensed Clinical Social Worker

## 2018-02-01 ENCOUNTER — Encounter (HOSPITAL_COMMUNITY): Payer: Self-pay | Admitting: Occupational Therapy

## 2018-02-01 ENCOUNTER — Other Ambulatory Visit (HOSPITAL_COMMUNITY): Payer: Medicare Other | Admitting: Occupational Therapy

## 2018-02-01 DIAGNOSIS — R4589 Other symptoms and signs involving emotional state: Secondary | ICD-10-CM

## 2018-02-01 DIAGNOSIS — F079 Unspecified personality and behavioral disorder due to known physiological condition: Secondary | ICD-10-CM

## 2018-02-01 DIAGNOSIS — F331 Major depressive disorder, recurrent, moderate: Secondary | ICD-10-CM | POA: Diagnosis not present

## 2018-02-01 DIAGNOSIS — F332 Major depressive disorder, recurrent severe without psychotic features: Secondary | ICD-10-CM
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
## 2018-02-04 ENCOUNTER — Encounter (HOSPITAL_COMMUNITY): Payer: Self-pay | Admitting: Occupational Therapy

## 2018-02-04 ENCOUNTER — Encounter (HOSPITAL_COMMUNITY): Payer: Self-pay

## 2018-02-04 ENCOUNTER — Other Ambulatory Visit (HOSPITAL_COMMUNITY): Payer: Medicare Other | Admitting: Occupational Therapy

## 2018-02-04 ENCOUNTER — Other Ambulatory Visit (HOSPITAL_COMMUNITY): Payer: Medicare Other | Admitting: Licensed Clinical Social Worker

## 2018-02-04 VITALS — BP 118/76 | HR 89 | Ht 68.0 in | Wt 160.0 lb

## 2018-02-04 DIAGNOSIS — R4589 Other symptoms and signs involving emotional state: Secondary | ICD-10-CM

## 2018-02-04 DIAGNOSIS — F331 Major depressive disorder, recurrent, moderate: Secondary | ICD-10-CM | POA: Diagnosis not present

## 2018-02-04 DIAGNOSIS — F079 Unspecified personality and behavioral disorder due to known physiological condition: Secondary | ICD-10-CM

## 2018-02-04 NOTE — Therapy (Signed)
[place], [place], [address] Phone: [phone]   Fax:  [phone]  Occupational Therapy Treatment  Patient Details  Name: Tina Pena MRN: [id number] Date of Birth: 1956/05/24 Referring Provider: Ricky Ala, NP   Encounter Date: 02/04/2018  OT End of Session - 02/04/18 1410    Visit Number  10    Number of Visits  16    Date for OT Re-Evaluation  02/14/18    Authorization Type  Medicare    OT Start Time  1115    OT Stop Time  1215    OT Time Calculation (min)  60 min    Activity Tolerance  Patient tolerated treatment well    Behavior During Therapy  [facility] for tasks assessed/performed       Past Medical History:  Diagnosis Date  . Bipolar disorder [facility])     Past Surgical History:  Procedure Laterality Date  . APPENDECTOMY    . GALLBLADDER SURGERY  2001  . HERNIA REPAIR    . TONSILLECTOMY  1981  . toupetfundoplicatio  [id number]  . TUBAL LIGATION      There were no vitals filed for this visit.  Subjective Assessment - 02/04/18 1410    Currently in Pain?  Other (Comment)   chronic pain     OT Treatment: Sleep Hygiene  S: "I know I need a routine and I don't have one"   O: Pt educated on sleep hygiene as it pertains to daily life/routines this date. Sleep hygiene questionnaire administered to increase insight on current sleep habits to help develop future goals. Education given on appropriate sleep routines, sleep disorders, detriments of too much/too little sleep with encouraged feedback of personal Experiences.Sleep diary handout given to challenge pt to track current habits and identify area for change. Pt asked to identify one STG in relation to sleep hygiene to create better daily sleep habits.   A: Pt presents to group with appropriate affect, engaged and participatory throughout group. Pt completed sleep hygiene questionnaire, identifying current poor sleep habits to improve  this date including: better routine management. Pt with insight of current poor sleep hygiene habits with mindset of positive change. Education received in a positive manner to help improve sleep hygiene in daily life. Pt agreeable to use sleep diary this date. Pt identified goal of maintaining a consistent bed time routine. She shares that coming to [facility] has improved her routine management and she wishes to continue this practice after d/c today.  P: Pt provided with skills to increase sleep hygiene habits into daily routine. OT will continue to follow up with communication skills for successful implementation into daily life.                     OT Education - 02/04/18 1410    Education Details  education given on sleep hygiene to increase engagement in BADL    Person(s) Educated  Patient    Methods  Explanation;Handout    Comprehension  Verbalized understanding       OT Short Term Goals - 02/04/18 1412      OT SHORT TERM GOAL #1   Title  Pt will recall and apply accountability skills to improve relationships when reintegrating into the community    Time  4    Period  Weeks    Status  Achieved    Target Date  02/14/18      OT SHORT TERM GOAL #2  Title  Pt will be educated on strategies to improve psychosocial skills needed to participate fully in all daily, work, and leisure activities    Time  4    Period  Weeks    Status  Achieved      OT SHORT TERM GOAL #3   Title  Pt will aply psychosocial skills and coping mechanisms to daily activities in order to function independently and reintegrate into community dwelling    Time  4    Period  Weeks    Status  Achieved               Plan - 02/04/18 1411    Occupational performance deficits (Please refer to evaluation for details):  ADL's;IADL's;Rest and Sleep;Work;Leisure;Social Participation       Patient will benefit from skilled therapeutic intervention in order to improve the following deficits and  impairments:  Decreased coping skills, Decreased psychosocial skills, Other (comment)(decreased ability to engage in BADL and reintegrate into community)  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 MDD (major depressive disorder), recurrent episode, moderate ([place]) 01/12/2018    OCCUPATIONAL THERAPY DISCHARGE SUMMARY  Visits from Start of Care: 10  Current functional level related to goals / functional outcomes: Independent   Remaining deficits: Continuing to improve insight, applying skills   Education / Equipment: Education given on psychosocial and coping skills to apply to BADL/IADL routine to increase functional community reintegration. Plan: Patient agrees to discharge.  Patient goals were met. Patient is being discharged due to meeting the stated rehab goals.  ?????        Zenovia Jarred, MSOT, OTR/L   [place] 02/04/2018, 2:13 PM  [facility] PARTIAL HOSPITALIZATION PROGRAM [place], [place], [address] Phone: [phone]   Fax:  [phone]  Name: Tina Pena MRN: [id number] Date of Birth: [birth year]

## 2018-02-04 NOTE — Progress Notes (Signed)
Patient presents with brighter affect, more level mood but reported her current level of depression an 8, anxiety a 5 and hopelessness a 2 on a scale of 0-10 with 0 being none and 10 the worst she could manage.  Patient stated she had a little more depression over the past weekend but stated she is using [facility] and has effectively managed her depressive thoughts to manage these effectively and not to harm self.  Patient stated no suicidal or homicidal ideations, no auditory or visual hallucinations and no attempts to harm self or others over the past few weeks.  Patient's scalp much improved and hair growing back in as she reports taking steps to ensure when she often has negative thoughts at night, she does not have immediate access to things to cut herself and also that her husband has been much more supportive as of late.  Patient reported still some ongoing pain in left thumb, rated at an 8 but will have joint replacement surgery this week to repair.  Patient also reported arthritis pain in right knee at a 9 today but plans to see rheumatologist on tomorrow to follow up on all current joint pain issues.  Patient stated she does feel ready for transition to IOP, beginning on tomorrow, and stated she was feeling much [facility] and more equipped to handle daily pressures and stress.  Patient stated no plans to harm self or others and agreed to contact this nurse, PHP staff or other staff at our facility if any problems with transition to IOP.  Patient again denied any suicidal or homicidal ideations with no plan or intent to harm self or others and will keep medical appointments to follow up on daily physical pain..  Patient smiling more today and engaged in conversation with positive thoughts about her future and transition to lower level of care with IOP.

## 2018-02-05 ENCOUNTER — Encounter (HOSPITAL_COMMUNITY): Payer: Self-pay | Admitting: Family

## 2018-02-05 ENCOUNTER — Other Ambulatory Visit (HOSPITAL_COMMUNITY): Payer: Self-pay

## 2018-02-05 ENCOUNTER — Ambulatory Visit (HOSPITAL_COMMUNITY): Payer: Self-pay

## 2018-02-05 NOTE — Progress Notes (Signed)
  [facility] Discharge Summary  Tina BilletStephanie Pena [id number]  Admission date: 01/18/2018 Discharge date: 02/05/2018  Reason for admission: Depression   Per assessment note: Patient is a 62 y.o. Caucasian female presents with depression.  Reports she felt life her life stated to come apart after a suicide attempt with cutting herself all over with a box cutter. States she was then charged with filling a false police reports and has been fighting a Administrator, sportsmisdemeanor charge.  States she has not disclosed this information to her husband as she reports her husband is a unsupportive regarding her mental health issues.  Reports diagnosis of bipolar, depression, anxiety and paranoia.  patient is currently denying suicidal homicidal ideations.  Reports she is followed by [facility].  Reports she is prescribed gabapentin 300 mg, Duloxetine 40 mg, clonazepam 1 mg PRN, reports she was recently initiated on Depakote 250 mg p.o. twice daily.  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  Patient was enrolled in partial psychiatric program on 01/18/18.  Progress in Program Toward Treatment Goals:  Ongoing, was reported patient discharged early due to preoperational appointment for her hand surgery. NP to follow-up with patient at IOP admission.   Progress (rationale): Stepdown to intensive  outpatient program (IOP)   Take all medications as prescribed. Keep all follow-up appointments as scheduled.  Do not consume alcohol or use illegal drugs while on prescription medications. Report any adverse effects from your medications to your primary care provider promptly.  In the event of recurrent symptoms or worsening symptoms, call 911, a crisis hotline, or go to the nearest emergency department for evaluation.     Tina Rackanika N Heavenleigh Petruzzi, NP 02/05/2018

## 2018-02-06 ENCOUNTER — Other Ambulatory Visit (HOSPITAL_COMMUNITY): Payer: Self-pay

## 2018-02-06 NOTE — Psych (Signed)
[facility] THERAPIST PROGRESS NOTE  Tina BilletStephanie Pena [id number]  Session Time: 9:00 - 11:00  Participation Level: Active  Behavioral Response: CasualAlertDepressed  Type of Therapy: Group Therapy  Treatment Goals addressed: Coping  Interventions: CBT, DBT, Solution Focused, Supportive and Reframing  Summary: Clinician led check-in regarding current stressors and situation, and review of patient completed daily inventory. Clinician utilized active listening and empathetic response and validated patient emotions. Clinician facilitated processing group on pertinent issues.   Therapist Response: Tina BilletStephanie Pena is a 62 y.o. female who presents with depression and mood symptoms. Patient arrived within time allowed and reports that she is feeling "pretty good." Patient rates her mood at a 7 on a scale of 1-10 with 10 being great. Pt reports her weekend went well and that she feels she made progress with her husband. Pt shares she felt cared for by him. Pt shared nerves about upcoming surgery and the recovery. Pt is able to process in group. Patient engaged in discussion.       Session Time: 11:00 -12:00   Participation Level: Active   Behavioral Response: CasualAlertDepressed   Type of Therapy: Group Therapy, OT   Treatment Goals addressed: Coping   Interventions: Psychosocial skills training, Supportive,    Summary:  Occupational Therapy group   Therapist Response: Patient engaged in group. See OT note.          Session Time: 12:00 - 12:45  Participation Level: Active  Behavioral Response: CasualAlertDepressed  Type of Therapy: Group Therapy, Activity Therapy  Treatment Goals addressed: Coping  Interventions: Psychologist, occupationalocial Skills Training, Supportive  Summary:  Reflection Group: Patients encouraged to practice skills and interpersonal techniques or work on mindfulness and relaxation techniques. The importance of self-care and making skills part of a routine to  increase usage were stressed   Therapist Response: Patient engaged and participated.      Session Time: 12:45 - 2:00   Participation Level: Active   Behavioral Response: CasualAlertDepressed   Type of Therapy: Group Therapy, Psychotherapy; Psychoeducation   Treatment Goals addressed: Coping   Interventions: CBT, Solution focused, Supportive, Reframing   Summary: 12:45 - 1:50 Cln discussed handout "The characteristics of bad communication" and group shared how they have engaged with those characteristics and how to avoid them in the future. 1:50 -2:00 Clinician led check-out. Clinician assessed for immediate needs, medication compliance and efficacy, and safety concerns   Therapist Response: Patient engaged in group. Pt recognized characteristics she has used particularly blame and passive-agressiveness.  At check-out, patient rates her mood at a 8 on a scale of 1-10 with 10 being great. Patient reports afternoon plans of paying bills and getting things ready for her surgery. Patient demonstrates some progress as evidenced by increased internal locus of control. Patient denies SI/HI/self-harm at the end of group.     Suicidal/Homicidal: Nowithout intent/plan   Plan: Pt will discharge from PHP due to meeting treatment goals of improved mood and increased skillfulness. Pt is being discharged per her request due to upcoming hand surgery. Provider and pt are aligned with discharge plan. Pt has been given information for IOP within this agency and will call to engage as soon as she is cleared to drive post-hand surgery on 02/06/18. Pt is scheduled with outpatient providers  Pt denies all SI/HI at discharge.     Diagnosis: MDD (major depressive disorder), recurrent episode, moderate ([facility]) [F33.1]    1. MDD (major depressive disorder), recurrent episode, moderate ([facility])       Tina GuilesJenny Jeannette Maddy,  LCSW 02/06/2018

## 2018-02-06 NOTE — Psych (Signed)
   [facility]  Tina BilletStephanie Pena [id number]  Session Time: 9:00 - 11:00  Participation Level: Active  Behavioral Response: CasualAlertDepressed  Type of Therapy: Group Therapy  Treatment Goals addressed: Coping  Interventions: CBT, DBT, Solution Focused, Supportive and Reframing  Summary: Clinician led check-in regarding current stressors and situation, and review of patient completed daily inventory. Clinician utilized active listening and empathetic response and validated patient emotions. Clinician facilitated processing group on pertinent issues.   Therapist Response: Tina BilletStephanie Ferrall is a 62 y.o. female who presents with depression and mood symptoms. Patient arrived within time allowed and reports that she is feeling "frustrated." Patient rates her mood at a 3 on a scale of 1-10 with 10 being great. Pt reports continued discussions with her husband in efforts to improve their relationship. Pt reports there are trust issues and she struggles with not shutting down. Patient engaged in discussion.       Session Time: 11:00 -12:00   Participation Level: Active   Behavioral Response: CasualAlertDepressed   Type of Therapy: Group Therapy, OT   Treatment Goals addressed: Coping   Interventions: Psychosocial skills training, Supportive,    Summary:  Occupational Therapy group   Therapist Response: Patient engaged in group. See OT note.          Session Time: 12:00 - 12:45  Participation Level: Active  Behavioral Response: CasualAlertDepressed  Type of Therapy: Group Therapy, Activity Therapy  Treatment Goals addressed: Coping  Interventions: Psychologist, occupationalocial Skills Training, Supportive  Summary:  Reflection Group: Patients encouraged to practice skills and interpersonal techniques or work on mindfulness and relaxation techniques. The importance of self-care and making skills part of a routine to increase usage were stressed   Therapist Response:  Patient engaged and participated.     Session Time: 12:45 - 2:00  Participation Level: Alert  Behavioral Response: CasualAlertDepressed  Type of Therapy: Group Therapy, Psychoeducation; Psychotherapy  Treatment Goals addressed: Coping  Interventions: CBT; Solution focused; Supportive; Reframing  Summary: 12:45 - 1:50: Cln led discussion on "Fair Fighting" handout. Group discussed how they handle disagreements currently and how implementing new communication techniques could improve their current relationships. 1:50 -2:00 Clinician led check-out. Clinician assessed for immediate needs, medication compliance and efficacy, and safety concerns   Therapist Response: Patient engaged in activity. Pt able to recognize which fair fighting tips she does not currently utilize and how implementing them could improve the relationship with her husband. At check-out, patient rates her mood at a 4 on a scale of 1-10 with 10 being great. Pt reports afternoon plans of running errands and spending time with her husband. Patient demonstrates some progress as evidenced by efforts to work on her communication skills. Patient denies SI/HI/self-harm thoughts at the end of group.       Suicidal/Homicidal: Nowithout intent/plan   Plan: Pt will continue in PHP while working to decrease depression symptoms and increase ability to manage symptoms as they arise.   Diagnosis: MDD (major depressive disorder), recurrent severe, without psychosis ([facility]) [F33.2]    1. MDD (major depressive disorder), recurrent severe, without psychosis ([facility])       Donia GuilesJenny Cherrise Occhipinti, LCSW 02/06/2018

## 2018-02-06 NOTE — Psych (Signed)
   [facility]  Tina BilletStephanie Nienhuis [id number]  Session Time: 9:00 - 10:00  Participation Level: Active  Behavioral Response: CasualAlertDepressed  Type of Therapy: Group Therapy; Psychotherapy  Treatment Goals addressed: Coping  Interventions: CBT, DBT, Solution Focused, Supportive and Reframing  Summary: Clinician led check-in regarding current stressors and situation, and review of patient completed daily inventory. Clinician utilized active listening and empathetic response and validated patient emotions. Clinician facilitated processing group on pertinent issues.   Therapist Response: Tina Pena is a 62 y.o. female who presents with depression and mood symptoms. Patient arrived within time allowed and reports that she is feeling "tired" Patient rates her mood at a 4 on a scale of 1-10 with 10 being great. Pt reports continued sleep issues due to conflicting sleep time from her husband. Pt states she can see progress from the conversations with her husband. Patient engaged in discussion.      Session Time: 10:00 - 11:00  Participation Level: Alert  Behavioral Response: CasualAlertDepressed  Type of Therapy: Group Therapy, Psychoeducation; Psychotherapy  Treatment Goals addressed: Coping  Interventions: CBT; Solution focused; Supportive; Reframing  Summary: Clinician introduced the 5 Love Languages. Group discussed how having awareness about the way loved ones feel and communicate love may change the relationship dynamics.    Therapist Response: Patient engaged and participated in discussion.       Session Time: 11:00 -12:00   Participation Level: Active   Behavioral Response: CasualAlertAnxious   Type of Therapy: Group Therapy, OT   Treatment Goals addressed: Coping   Interventions: Psychosocial skills training, Supportive    Summary:  Occupational Therapy group   Therapist Response: Patient engaged in group. See OT note.          Session Time: 12:00 - 1:00  Participation Level: Alert  Behavioral Response: CasualAlertDepressed  Type of Therapy: Group Therapy, Psychoeducation; Psychotherapy  Treatment Goals addressed: Coping  Interventions: CBT; Solution focused; Supportive; Reframing  Summary: 12:00 - 12:50: Clinician continued topic of self esteem. Group watched "The Person You Really Need to Marry" TED talk and discussed how viewing the relationship with yourself as a "marriage" could shift the way we treat ourselves. 12:50 -1:00 Clinician led check-out. Clinician assessed for immediate needs, medication compliance and efficacy, and safety concerns    Therapist Response: Patient engaged in activity. Pt reports she cannot imagine what would change if she treated herself as she did others and wants to begin to take better care of herself.  At check-out, patient rates her mood at a 4 on a scale of 1-10 with 10 being great. Patient reports weekend plans of continuing conversations with her husband regarding their relationship and resting. Patient demonstrates some progress as evidenced by increased willingness to be introspective and put effort into relationships. Patient denies SI/HI/self-harm at the end of group.       Suicidal/Homicidal: Nowithout intent/plan   Plan: Pt will continue in PHP while working to decrease depression symptoms and increase ability to manage symptoms as they arise.   Diagnosis: MDD (major depressive disorder), recurrent severe, without psychosis (HCC) [F33.2]    1. MDD (major depressive disorder), recurrent severe, without psychosis (HCC)       Donia GuilesJenny Frayda Egley, LCSW 02/06/2018

## 2018-02-07 ENCOUNTER — Other Ambulatory Visit (HOSPITAL_COMMUNITY): Payer: Self-pay

## 2018-02-07 ENCOUNTER — Ambulatory Visit (HOSPITAL_COMMUNITY): Payer: Self-pay

## 2018-02-08 ENCOUNTER — Ambulatory Visit (HOSPITAL_COMMUNITY): Payer: Self-pay

## 2018-02-08 ENCOUNTER — Other Ambulatory Visit (HOSPITAL_COMMUNITY): Payer: Self-pay

## 2018-02-11 ENCOUNTER — Other Ambulatory Visit (HOSPITAL_COMMUNITY): Payer: Self-pay

## 2018-02-11 ENCOUNTER — Ambulatory Visit (HOSPITAL_COMMUNITY): Payer: Self-pay

## 2018-02-12 ENCOUNTER — Other Ambulatory Visit (HOSPITAL_COMMUNITY): Payer: Self-pay

## 2018-02-12 ENCOUNTER — Ambulatory Visit (HOSPITAL_COMMUNITY): Payer: Self-pay

## 2018-02-13 ENCOUNTER — Other Ambulatory Visit (HOSPITAL_COMMUNITY): Payer: Self-pay | Admitting: Psychiatry

## 2018-02-13 ENCOUNTER — Other Ambulatory Visit (HOSPITAL_COMMUNITY): Payer: Self-pay

## 2018-02-13 MED ORDER — DULOXETINE HCL 40 MG PO CPEP: 40.0000 mg | ORAL_CAPSULE | Freq: Every evening | ORAL | 0 refills | Status: DC

## 2018-02-13 MED ORDER — DULOXETINE HCL 60 MG PO CPEP: 60.0000 mg | ORAL_CAPSULE | Freq: Every day | ORAL | 0 refills | Status: DC

## 2018-02-14 ENCOUNTER — Ambulatory Visit (HOSPITAL_COMMUNITY): Payer: Self-pay

## 2018-02-14 ENCOUNTER — Other Ambulatory Visit (HOSPITAL_COMMUNITY): Payer: Self-pay

## 2018-02-14 NOTE — Progress Notes (Signed)
BH MD/PA/NP OP Progress Note  02/19/2018 10:07 AM Tina Pena  MRN:  [id number]  Chief Complaint:  Chief Complaint    Follow-up; Depression     HPI:  Patient presents for follow-up appointment for depression.  She states that she has been doing better since the last appointment.  She finds PHP group therapy to be very helpful.  Although there were times she felt more depressed in the context of discordance with her family, she was able to use some of skills she learned in PHP.  She is concerned about baby shower of her sister's step daughter. She was told that the patient had not place to stay if she were to attend there. She is also concerned that her family may think that she is "lying" if she talks about her arm (s/p surgery). She agrees that she wants to have compassion as she does to other people. Also think of alternative way to do value congruent action. She sleeps 8 hours. She feels less depressed.  She has fair concentration.  She denies SI, SIB.  She denies anxiety or panic attacks.  She denies decreased need for sleep or euphoria.  She felt drowsy on higher dose of duloxetine; she is currently taking 60 mg and 20 mg.  She takes clonazepam for anxiety.  She will start to see a therapist at regular center next month.   Tina Pena presents to the interview.  He also thinks that she is doing better except that she appears to be stressed around her family issues.   Per PMP,  Clonazepam filled on 02/01/2018    Visit Diagnosis:    ICD-10-CM   1. MDD (major depressive disorder), recurrent episode, moderate (HCC) F33.1     Past Psychiatric History: Please see initial evaluation for full details. I have reviewed the history. No updates at this time.     Past Medical History:  Past Medical History:  Diagnosis Date  . Bipolar disorder [facility])     Past Surgical History:  Procedure Laterality Date  . APPENDECTOMY    . GALLBLADDER SURGERY  2001  . HERNIA REPAIR    . TONSILLECTOMY  1981   . toupetfundoplicatio  2000  . TUBAL LIGATION      Family Psychiatric History: Please see initial evaluation for full details. I have reviewed the history. No updates at this time.     Family History:  Family History  Problem Relation Age of Onset  . Bipolar disorder Father   . Anxiety disorder Father   . Depression Father   . Paranoid behavior Father   . Bipolar disorder Maternal Grandmother   . Dementia Maternal Grandmother   . Bipolar disorder Paternal Grandmother   . Dementia Paternal Grandmother     Social History:  Social History   Socioeconomic History  . Marital status: Married    Spouse name: Not on file  . Number of children: 2  . Years of education: Not on file  . Highest education level: Bachelor's degree (e.g., BA, AB, BS)  Occupational History  . Not on file  Social Needs  . Financial resource strain: Not hard at all  . Food insecurity:    Worry: Never true    Inability: Never true  . Transportation needs:    Medical: No    Non-medical: No  Tobacco Use  . Smoking status: Never Smoker  . Smokeless tobacco: Never Used  Substance and Sexual Activity  . Alcohol use: Not Currently    Comment: occasional glass  of wine  . Drug use: Never  . Sexual activity: Yes    Partners: Male    Birth control/protection: None  Lifestyle  . Physical activity:    Days per week: 0 days    Minutes per session: 0 min  . Stress: Very much  Relationships  . Social connections:    Talks on phone: Once a week    Gets together: Once a week    Attends religious service: Never    Active member of club or organization: No    Attends meetings of clubs or organizations: Never    Relationship status: Married  Other Topics Concern  . Not on file  Social History Narrative  . Not on file    Allergies:  Allergies  Allergen Reactions  . Codeine Nausea And Vomiting  . Garlic Nausea And Vomiting  . Other Nausea And Vomiting    Onions     Metabolic Disorder Labs: No  results found for: HGBA1C, MPG No results found for: PROLACTIN No results found for: CHOL, TRIG, HDL, CHOLHDL, VLDL, LDLCALC Lab Results  Component Value Date   TSH 4.510 (H) 01/12/2018    Therapeutic Level Labs: No results found for: LITHIUM Lab Results  Component Value Date   VALPROATE 43 (L) 01/16/2018   VALPROATE CANCELED 01/12/2018   No components found for:  CBMZ  Current Medications: Current Outpatient Medications  Medication Sig Dispense Refill  . [START ON 03/04/2018] clonazePAM (KLONOPIN) 1 MG tablet Take 1 tablet (1 mg total) by mouth 2 (two) times daily. 60 tablet 1  . conjugated estrogens (PREMARIN) vaginal cream Place 1 Applicatorful vaginally daily. Twice a week    . divalproex (DEPAKOTE) 250 MG DR tablet Take 1 tablet (250 mg total) by mouth 2 (two) times daily. 1 in the AM and 2 in the PM 90 tablet 0  . DULoxetine (CYMBALTA) 60 MG capsule Take 1 capsule (60 mg total) by mouth daily. 1 tab by mouth daily 30 capsule 1  . gabapentin (NEURONTIN) 300 MG capsule Take 300 mg by mouth 2 (two) times daily. 1 in the AM and 1 in the PM    . hydroxychloroquine (PLAQUENIL) 200 MG tablet Take 200 mg by mouth 2 (two) times daily. 1 tab by mouth in AM, 1 tab by mouth in PM    . Loratadine-Pseudoephedrine (CLARITIN-D 24 HOUR PO) Take by mouth. 1 tab by mouth in AM as needed    . methotrexate (RHEUMATREX) 2.5 MG tablet Take 15 mg by mouth once a week. Caution:Chemotherapy. Protect from light.    . rizatriptan (MAXALT) 5 MG tablet Take 5 mg by mouth as needed for migraine. May repeat in 2 hours if needed    . traMADol (ULTRAM) 50 MG tablet Take 50 mg by mouth every 6 (six) hours as needed for moderate pain (6-8 per day depending on pain).    . DULoxetine (CYMBALTA) 20 MG capsule Take 1 capsule (20 mg total) by mouth daily. 30 capsule 1   No current facility-administered medications for this visit.      Musculoskeletal: Strength & Muscle Tone: within normal limits Gait & Station:  normal Patient leans: N/A  Psychiatric Specialty Exam: Review of Systems  Psychiatric/Behavioral: Positive for depression. Negative for hallucinations, memory loss, substance abuse and suicidal ideas. The patient is not nervous/anxious and does not have insomnia.   All other systems reviewed and are negative.   Blood pressure 114/75, pulse 93, height 5\' 8"  (1.727 m), weight 154 lb (69.9 kg),  SpO2 98 %.Body mass index is 23.42 kg/m.  General Appearance: Fairly Groomed  Eye Contact:  Good  Speech:  Clear and Coherent  Volume:  Normal  Mood:  "better"  Affect:  Appropriate, Congruent and euthymic, reactive  Thought Process:  Coherent  Orientation:  Full (Time, Place, and Person)  Thought Content: Logical   Suicidal Thoughts:  No  Homicidal Thoughts:  No  Memory:  Immediate;   Good  Judgement:  Good  Insight:  Fair  Psychomotor Activity:  Normal  Concentration:  Concentration: Good and Attention Span: Good  Recall:  Good  Fund of Knowledge: Good  Language: Good  Akathisia:  No  Handed:  Right  AIMS (if indicated): not done  Assets:  Communication Skills Desire for Improvement  ADL's:  Intact  Cognition: WNL  Sleep:  Good   Screenings: GAD-7     Counselor from 02/04/2018 in BEHAVIORAL HEALTH PARTIAL HOSPITALIZATION PROGRAM Counselor from 01/18/2018 in BEHAVIORAL HEALTH PARTIAL HOSPITALIZATION PROGRAM  Total GAD-7 Score  9  13    PHQ2-9     Counselor from 02/04/2018 in BEHAVIORAL HEALTH PARTIAL HOSPITALIZATION PROGRAM Counselor from 01/18/2018 in BEHAVIORAL HEALTH PARTIAL HOSPITALIZATION PROGRAM  PHQ-2 Total Score  3  6  PHQ-9 Total Score  15  20       Assessment and Plan:  Tina Pena is a 62 y.o. year old female with a history of depression, migraine, chronic back pain, who presents for follow up appointment for MDD (major depressive disorder), recurrent episode, moderate (HCC)   # MDD, moderate, recurrent without psychotic features # Unspecified anxiety  disorder # r/o borderline personality disorder There has been significant improvement in neurovegetative and anxiety symptoms in the context of starting PHP.  Psychosocial stressors including family issues with her daughter, stepsister and her sister.  Will continue duloxetine to target depression. Will continue Depakote for mood dysregulation. Will continue clonazepam for anxiety. Discussed risk of oversedation and dependence. Noted that although she was diagnosed with bipolar disorder in the past, her clinical course is more consistent with borderline personality disorder.  Will continue to monitor. Discussed self compassion, cognitive defusion. She has an upcoming appointment with her therapist.   Plan I have reviewed and updated plans as below 1. Continue Duloxetine 60 mg daily in the morning, 20 mg at night (could not tolerate higher dose) 2. Continue Valproic acid 250 mg daily, 500 mg qhs  3. Continue clonazepam 1 mg twice a day  4. Return to clinic in two months for 30 mins - She is on gabapentin 300 mg BID  The patient demonstrates the following risk factors for suicide: Chronic risk factors for suicide include: psychiatric disorder of depression, previous suicide attempts of overdosing medication, , previous self-harm of cutting and chronic pain. Acute risk factors for suicide include: family or marital conflict, unemployment and social withdrawal/isolation. Protective factors for this patient include: positive social support, responsibility to others (children, family), coping skills and hope for the future. Considering these factors, the overall suicide risk at this point appears to be moderate, but not at imminent risk of self. She is future oriented and is amenable to MH treatment.  Patient is appropriate for outpatient follow up.  The duration of this appointment visit was 30 minutes of face-to-face time with the patient.  Greater than 50% of this time was spent in counseling,  explanation of  diagnosis, planning of further management, and coordination of care.  Neysa Hottereina Kule Gascoigne, MD 02/19/2018, 10:07 AM

## 2018-02-15 ENCOUNTER — Other Ambulatory Visit (HOSPITAL_COMMUNITY): Payer: Self-pay

## 2018-02-19 ENCOUNTER — Encounter (HOSPITAL_COMMUNITY): Payer: Self-pay | Admitting: Psychiatry

## 2018-02-19 ENCOUNTER — Ambulatory Visit (INDEPENDENT_AMBULATORY_CARE_PROVIDER_SITE_OTHER): Payer: Medicare Other | Admitting: Psychiatry

## 2018-02-19 VITALS — BP 114/75 | HR 93 | Ht 68.0 in | Wt 154.0 lb

## 2018-02-19 DIAGNOSIS — F331 Major depressive disorder, recurrent, moderate: Secondary | ICD-10-CM

## 2018-02-19 DIAGNOSIS — Z4789 Encounter for other orthopedic aftercare: Secondary | ICD-10-CM | POA: Insufficient documentation

## 2018-02-19 MED ORDER — DULOXETINE HCL 20 MG PO CPEP: 20.0000 mg | ORAL_CAPSULE | Freq: Every day | ORAL | 1 refills | Status: DC

## 2018-02-19 MED ORDER — DULOXETINE HCL 60 MG PO CPEP: 60.0000 mg | ORAL_CAPSULE | Freq: Every day | ORAL | 1 refills | Status: DC

## 2018-02-19 MED ORDER — CLONAZEPAM 1 MG PO TABS: 1.0000 mg | ORAL_TABLET | Freq: Two times a day (BID) | ORAL | 1 refills | Status: DC

## 2018-02-19 NOTE — Patient Instructions (Signed)
1. Continue Duloxetine 60 mg daily in the morning, 20 mg at night 2. Continue Valproic acid 250 mg daily, 500 mg qhs  3. Continue clonazepam 1 mg twice a day  4. Return to clinic in two months for 30 mins

## 2018-03-11 ENCOUNTER — Encounter (HOSPITAL_COMMUNITY): Payer: Self-pay | Admitting: Psychiatry

## 2018-03-11 ENCOUNTER — Other Ambulatory Visit (HOSPITAL_COMMUNITY): Payer: Medicare Other | Attending: Psychiatry | Admitting: Licensed Clinical Social Worker

## 2018-03-11 DIAGNOSIS — Z818 Family history of other mental and behavioral disorders: Secondary | ICD-10-CM | POA: Diagnosis not present

## 2018-03-11 DIAGNOSIS — Z9889 Other specified postprocedural states: Secondary | ICD-10-CM | POA: Diagnosis not present

## 2018-03-11 DIAGNOSIS — Z9851 Tubal ligation status: Secondary | ICD-10-CM | POA: Insufficient documentation

## 2018-03-11 DIAGNOSIS — Z79899 Other long term (current) drug therapy: Secondary | ICD-10-CM | POA: Diagnosis not present

## 2018-03-11 DIAGNOSIS — F331 Major depressive disorder, recurrent, moderate: Secondary | ICD-10-CM

## 2018-03-11 DIAGNOSIS — Z79891 Long term (current) use of opiate analgesic: Secondary | ICD-10-CM | POA: Insufficient documentation
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
## 2018-03-12 ENCOUNTER — Encounter (HOSPITAL_COMMUNITY): Payer: Self-pay | Admitting: Family

## 2018-03-12 ENCOUNTER — Other Ambulatory Visit (HOSPITAL_COMMUNITY): Payer: Medicare Other | Admitting: Family

## 2018-03-12 DIAGNOSIS — F331 Major depressive disorder, recurrent, moderate: Secondary | ICD-10-CM | POA: Diagnosis not present

## 2018-03-12 NOTE — Progress Notes (Signed)
Psychiatric Initial Adult Assessment IOP  Patient Identification: Tina Pena MRN:  [id number] Date of Evaluation:  03/12/2018 Referral Source: PHP Chief Complaint:  depression  Visit Diagnosis:    ICD-10-CM   1. MDD (major depressive disorder), recurrent episode, moderate (HCC) F33.1     History of Present Illness: Per assessment note for PHP- patient is a 62 y.o.Tina Pena female presents with depression.Reports she felt life her life "started to come apart after a suicide attempt "with cutting herself all over with a box cutter. 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 Reports diagnosis of bipolar, depression,anxiety and paranoia.patient is currently denying suicidal homicidal ideations. Reports she is followed by MD Hisada.Reports she is prescribed gabapentin 300 mg, Duloxetine 40 mg, clonazepam 1 mg PRN,reports she was recently initiated on Depakote 250 mg p.o. twice daily.Reports chronic medical health concerns related to auto immune disorder. Patient was enrolled in partial psychiatric program on 01/18/18.  Evaluation to IOP: Tina Pena is awake alert and oriented attending daily group sessions with active and engaged participation.  Patient recently discharged from carpal tunnel surgery.  Currently denies suicidal or homicidal ideation.  Denies auditory or visual hallucinations.  Denies paranoia or depressive symptoms.  States she feels that partial hospitalization has helped and improve the communication between she and her husband.  States tolerating multiple coping skills and distraction techniques.  As she reports her overall mood has improved since her admission.  Reports she wanted to complete  (IOP) intensive outpatient programming for additional skills and  resources.  Continues to report her husband has been supportive during this journey.  Reports taking her medication as prescribed and tolerating them well.  States a good appetite.  States she is resting well.  Reports she thought she would have increased anxiety related to recent surgery however has been able to handle the pain and stress. Support, encouragement and reassurance was provided.  Associated Signs/Symptoms: Depression Symptoms:  depressed mood, difficulty concentrating, anxiety, (Hypo) Manic Symptoms:  Distractibility, Anxiety Symptoms:  Excessive Worry, Psychotic Symptoms:  Hallucinations: None PTSD Symptoms: NA  Past Psychiatric History:   Previous Psychotropic Medications: Yes   Substance Abuse History in the last 12 months:  No.  Consequences of Substance Abuse: NA  Past Medical History:  Past Medical History:  Diagnosis Date  . Bipolar disorder [facility])     Past Surgical History:  Procedure Laterality Date  . APPENDECTOMY    . GALLBLADDER SURGERY  2001  . HERNIA REPAIR    . TONSILLECTOMY  1981  . toupetfundoplicatio  2000  . TUBAL LIGATION      Family Psychiatric History:   Family History:  Family History  Problem Relation Age of Onset  . Bipolar disorder Father   . Anxiety disorder Father   . Depression Father   . Paranoid behavior Father   . Bipolar disorder Maternal Grandmother   . Dementia Maternal Grandmother   . Bipolar disorder Paternal Grandmother   . Dementia Paternal Grandmother     Social History:   Social History   Socioeconomic History  . Marital status: Married    Spouse name: Not on file  . Number of children: 2  . Years of education: Not on file  . Highest education level: Bachelor's degree (e.g., BA, AB, BS)  Occupational History  . Not on file  Social  Needs  . Financial resource strain: Not hard at all  . Food insecurity:    Worry: Never true    Inability: Never true  . Transportation needs:    Medical: No     Non-medical: No  Tobacco Use  . Smoking status: Never Smoker  . Smokeless tobacco: Never Used  Substance and Sexual Activity  . Alcohol use: Not Currently    Comment: occasional glass of wine  . Drug use: Never  . Sexual activity: Yes    Partners: Male    Birth control/protection: None  Lifestyle  . Physical activity:    Days per week: 0 days    Minutes per session: 0 min  . Stress: Very much  Relationships  . Social connections:    Talks on phone: Once a week    Gets together: Once a week    Attends religious service: Never    Active member of club or organization: No    Attends meetings of clubs or organizations: Never    Relationship status: Married  Other Topics Concern  . Not on file  Social History Narrative  . Not on file    Additional Social History:   Allergies:   Allergies  Allergen Reactions  . Codeine Nausea And Vomiting  . Garlic Nausea And Vomiting  . Other Nausea And Vomiting    Onions     Metabolic Disorder Labs: No results found for: HGBA1C, MPG No results found for: PROLACTIN No results found for: CHOL, TRIG, HDL, CHOLHDL, VLDL, LDLCALC   Current Medications: Current Outpatient Medications  Medication Sig Dispense Refill  . clonazePAM (KLONOPIN) 1 MG tablet Take 1 tablet (1 mg total) by mouth 2 (two) times daily. 60 tablet 1  . conjugated estrogens (PREMARIN) vaginal cream Place 1 Applicatorful vaginally daily. Twice a week    . divalproex (DEPAKOTE) 250 MG DR tablet Take 1 tablet (250 mg total) by mouth 2 (two) times daily. 1 in the AM and 2 in the PM 90 tablet 0  . DULoxetine (CYMBALTA) 20 MG capsule Take 1 capsule (20 mg total) by mouth daily. 30 capsule 1  . DULoxetine (CYMBALTA) 60 MG capsule Take 1 capsule (60 mg total) by mouth daily. 1 tab by mouth daily 30 capsule 1  . gabapentin (NEURONTIN) 300 MG capsule Take 300 mg by mouth 2 (two) times daily. 1 in the AM and 1 in the PM    . hydroxychloroquine (PLAQUENIL) 200 MG tablet Take 200  mg by mouth 2 (two) times daily. 1 tab by mouth in AM, 1 tab by mouth in PM    . Loratadine-Pseudoephedrine (CLARITIN-D 24 HOUR PO) Take by mouth. 1 tab by mouth in AM as needed    . methotrexate (RHEUMATREX) 2.5 MG tablet Take 15 mg by mouth once a week. Caution:Chemotherapy. Protect from light.    . rizatriptan (MAXALT) 5 MG tablet Take 5 mg by mouth as needed for migraine. May repeat in 2 hours if needed    . traMADol (ULTRAM) 50 MG tablet Take 50 mg by mouth every 6 (six) hours as needed for moderate pain (6-8 per day depending on pain).     No current facility-administered medications for this visit.     Neurologic: Headache: No Seizure: No Paresthesias:No  Musculoskeletal: Strength & Muscle Tone: within normal limits Gait & Station: normal Patient leans: N/A  Psychiatric Specialty Exam: Review of Systems  Psychiatric/Behavioral: Negative for suicidal ideas. Depression: improving  The patient has insomnia (improving ).  All other systems reviewed and are negative.   There were no vitals taken for this visit.There is no height or weight on file to calculate BMI.  General Appearance: Casual  Eye Contact:  Good  Speech:  Clear and Coherent  Volume:  Normal  Mood:  Anxious and Depressed  Affect:  Appropriate  Thought Process:  Coherent  Orientation:  Full (Time, Place, and Person)  Thought Content:  Logical  Suicidal Thoughts:  No  Homicidal Thoughts:  No  Memory:  Immediate;   Pena Recent;   Pena Remote;   Pena  Judgement:  Pena  Insight:  NA  Psychomotor Activity:  Normal  Concentration:  Concentration: Pena  Recall:  [employer]  Fund of Knowledge:Pena  Language: Pena  Akathisia:  No  Handed:  Right  AIMS (if indicated):    Assets:  Manufacturing systems engineerCommunication Skills Physical Health Social Support  ADL's:  Intact  Cognition: WNL  Sleep:      Treatment Plan Summary: Admit to IOP- intensive outpatient program Keep follow-up appointment with MD Hisada Medication management    Continue current medications as prescribed   Treatment plan was reviewed and agreed upon by Tina Pena and patient Tina BilletStephanie Pena need for continued group services.  Oneta Rackanika N Marvina Danner, NP 9/17/20192:04 PM

## 2018-03-12 NOTE — Progress Notes (Signed)
    Daily Group Progress Note  Program: IOP  Group Time: 9am-12pm Participation Level: Active  Behavioral Response: Appropriate  Type of Therapy:  Group Therapy  Summary of Progress:  9am-10:30am Clinician checked in with group members, assessing for SI/HI/psychosis. Clinician inquired about completion of self care activities over the weekend. Clinician facilitated stretching exercise.  Clinician presented psychoeducational material on ' Feelings, Thoughts, and Mind Traps: A Guide to Fortune BrandsMind Traps' from [employer] curriculum. Clinician facilitated discussion with clients on types of distorted thinking styles and how to challenge thoughts. Clinician allowed time for clients to discuss examples of distorted thoughts as well as help other group members create more helpful thoughts and phrases. Clinician wrapped up with Steps for challenging mind traps.  10:30-12pm Clinician continued with Roadblocks to Healthy Thinking 'Ways of Thinking That Keep You Stuck.' Clinician facilitated conversation related to how distorted thinking effects behaviors. Clinician reminded group members of Cognitive Triangle and the connection between thoughts, feelings, and behaviors. Clinician and group members discussed when examples are genuine (ex: confusion) vs when they are unhelpful and help avoid taking responsibility for own behaviors.  Clinician closed requested a self care activity planned for the rest of the day. Client attended first IOP session. Client participated fully in all group discussions and activities. Client was able to verbalize mind traps and roadblocks she recognized in her life and was open to feedback for alternative thoughts and phrases. Client requested group feedback on family dynamic concern and creating a more helpful thought and feeling. Client was open to feedback and thanked the group.   Harlon DittyKarissa A Namon Villarin, LCSW

## 2018-03-12 NOTE — Progress Notes (Signed)
    Daily Group Progress Note  Program: IOP  Group Time: 9am - 12pm  Participation Level: Active  Behavioral Response: Appropriate  Type of Therapy:  Group Therapy  Summary of Progress:  9am-10am Clinician checked in with clients, assessing for overall level of functioning inquiring about positive self trait and completed self care activity from previous day.  Clinician and group members continued with Roadblocks to Healthy Thinking from [facility] curriculum, and processed the benefits or problems related to accepting responsibility for own behaviors. Clinician and group members reviewed 'Tips for Breaking A Cycle' including awareness, honesty, motivation, and thought stopping.  10am- 11am Clinician presented Interpersonal Effectiveness Skills, including D.E.A.R.M.A.N, G.I.V.E., and F.A.S.T.. Clinician and group members processed the effect of healthy vs unhealthy communication skills on mental health symptoms and overall wellness. Clinician reviewed skills with clients and encouraged clients to utilize skill in session. Clinician praised clients for attempting skill and being receptive to feedback. Clinician clarified the reason for each skill, objective effectiveness, relationship effectiveness, and self-respect effectiveness.  11am-12pm Chaplin processed issues related to grief and loss. Client participated in all group activities and discussions. Client practiced communication skill related to a conversation she frequently has with her husband about taking the dog out. Client was receptive to feedback and improved skill. Client noted that the change in communication style did effect her thoughts and feelings about the situation.   Harlon DittyKarissa A Maleya Leever, LCSW

## 2018-03-13 ENCOUNTER — Other Ambulatory Visit (HOSPITAL_COMMUNITY): Payer: Medicare Other | Admitting: Licensed Clinical Social Worker

## 2018-03-13 DIAGNOSIS — F331 Major depressive disorder, recurrent, moderate: Secondary | ICD-10-CM | POA: Diagnosis not present

## 2018-03-13 NOTE — Progress Notes (Signed)
    Daily Group Progress Note  Program: IOP  Group Time: 9am- 12pm  Participation Level: Active  Behavioral Response: Appropriate  Type of Therapy:  Group Therapy  Summary of Progress:  Clinician checked in with group members, inquiring about completed self care activity from previous day.  Clinician presented the topic of anger as a secondary emotion. Clinician provided clients the opportunity to discuss initial thoughts related to anger. Clinician facilitated discussion about how and where expressing anger is learned, such as the family growing up. Clinician inquired how this has effected current communication styles and expression of anger.  Clinician processed with clients thoughts and emotions 'under' anger, and provided worksheet on 'Anger [place].' Clinician validated client thoughts and emotions and praised clients for identifying additional feelings resulting in displaying anger.  Clinician presented Tip 52: Hallmarks of Effective Communication. Clinician and clients reviewed alternate statements for effective communication which could be used in discussions  Asher MuirJamie from [facility] attended group. She presented information on setting small goals for healthy lifestyle and making small improvements at a time. Discussion included nutrition, exercise, and sleep hygiene.  Client presented late and reported due to worry about late she almost did not come, but was able to identify the importance of attending therapy regularly and therefore did attend. Client was able to display skill of using gratitude by replacing 'I'm Sorry...' statements with 'Thank you for...' statements. Client verbalized how anger was displayed in her house growing up and how it differed while raising her children. Client was able to identify several feelings related to anger, especially relating to family dynamics. Client previously reach out to siblings however it was overall not well received; but client  reports she continues to improve her own mental health in order to be in a better place to interact with others. Client participated in health and wellness discussion reporting how she has had to adapt work outs basked on age and current broken arm.    Harlon DittyKarissa A Croy Drumwright, LCSW

## 2018-03-14 ENCOUNTER — Other Ambulatory Visit (HOSPITAL_COMMUNITY): Payer: Medicare Other | Admitting: Psychiatry

## 2018-03-14 DIAGNOSIS — F331 Major depressive disorder, recurrent, moderate: Secondary | ICD-10-CM | POA: Diagnosis not present

## 2018-03-14 NOTE — Progress Notes (Signed)
    Daily Group Progress Note  Program: IOP  Group Time: 9am-12pm  Participation Level: Active  Behavioral Response: Appropriate and Sharing  Type of Therapy:  Group Therapy  Summary of Progress:  The purpose of this group is to utilizes CBT skills in a group setting to increase the use of healthy coping skills and decrease intensity of active mental healthy symptoms.  9am-11am Clinician presented the topic of Cognitive Modeling. The purpose of this topic is to address the though changes in the cognitive triangle to effect feelings and responsive behaviors. Clinician requested clients share a circumstance that is a fact, with resulting thoughts feelings and behaviors. Next, clinician and client reviewed identifying a new thought related to the same situation, which could lead to a different feeling and behavioral response. Clinician allowed time for processing of associated thoughts and feelings for circumstances.  11am-12pm Second part of group was utilized to provide time for practicing yoga and meditation as mindfulness skill. Client presented fully oriented with matching mood and affect. Client participated fully in all group discussions and activities. Client utilized conversations with her family as an example for cognitive modeling. Client was receptive to feedback from group members and noted ways she could attempt to approach the situation differently. Client has showed progress toward her goals she reports by speaking up more often around people she does not know well.  Harlon DittyKarissa A Shari Natt, LCSW

## 2018-03-15 ENCOUNTER — Telehealth (HOSPITAL_COMMUNITY): Payer: Self-pay | Admitting: Psychiatry

## 2018-03-15 ENCOUNTER — Other Ambulatory Visit (HOSPITAL_COMMUNITY): Payer: Medicare Other

## 2018-03-18 ENCOUNTER — Other Ambulatory Visit (HOSPITAL_COMMUNITY): Payer: Medicare Other | Admitting: Licensed Clinical Social Worker

## 2018-03-18 DIAGNOSIS — F331 Major depressive disorder, recurrent, moderate: Secondary | ICD-10-CM | POA: Diagnosis not present

## 2018-03-19 ENCOUNTER — Other Ambulatory Visit (HOSPITAL_COMMUNITY): Payer: Medicare Other | Admitting: Licensed Clinical Social Worker

## 2018-03-19 DIAGNOSIS — F331 Major depressive disorder, recurrent, moderate: Secondary | ICD-10-CM

## 2018-03-19 NOTE — Progress Notes (Signed)
    Daily Group Progress Note  Program: IOP  Group Time: 9am-12pm  Participation Level: Active  Behavioral Response: Appropriate  Type of Therapy:  Group Therapy; process group, psychoeducational group  Summary of Progress:  The purpose of this group is to decrease active mental health symptoms and increase utilization of healthy skills utilizing CBT and DBT skills in a group setting. 9am-10:30am The purpose of this time in session was used for psycho-educational time with pharmacist. Clients were allowed time to inquire about medications, side effects, etc. related to medications.  10:30am-12pm Clinician presented the topic of 'Building Happiness' and reviewed worksheet. Clinician and group members processed the pros and cons of gratitude, acts of kindness, exercise, meditation, positive journaling, and fostering relationships. Clinician and group members participated in mindfulness meditation activity focused on shifting concentration from wandering to focused mind. Clinician and group members reviewed Gratitude worksheet. Clinician facilitated discussion between clients on struggles related to constant need to use coping skills rather than skills coming easily. Clinician actively listened to group member concerns and validated thoughts and feelings. Clinician utilized summarizing statements using clarifying questions to continue guiding conversation. Clinician and group members discussed how skills could be practiced in daily life, such as small acts of kindness, exercise, and meditation to help improve skill building in a non-stressful environment. Client participated fully in all group discussions and activities. Client notes she has been working on finding more positive thoughts but continues to struggle with perfectionism and improving self-esteem. Client reports decreased level of anxiety after meditation activity.  Harlon DittyKarissa A Joselin Crandell, LCSW

## 2018-03-20 ENCOUNTER — Other Ambulatory Visit (HOSPITAL_COMMUNITY): Payer: Medicare Other | Admitting: Licensed Clinical Social Worker

## 2018-03-20 DIAGNOSIS — F331 Major depressive disorder, recurrent, moderate: Secondary | ICD-10-CM

## 2018-03-20 NOTE — Progress Notes (Signed)
    Daily Group Progress Note  Program: IOP  Group Time: 9 a.m- 12 noon  Participation Level: Active  Behavioral Response: Appropriate and Sharing  Type of Therapy:  Group Therapy  Summary of Progress: The purpose of this group is to discuss stress management and to utilize CBT skills in a group setting to increase the use of healthy coping skills and to decrease negative symptoms of stress. Clinician discussed how stress can be good and bad; but when it interferes with your well-being you need to intervene. Discussed how there are many different kinds of stressors and ways to cope with each by way of diet, exercise, faith, smiling/laughing, buying flowers, finding joy, meditating, massage, etc.. Second part of group was utilized to provide time for experiencing various aromatherapy essential oils as a mindfulness skill. Each client were able to state the symptom(s) they are struggling with and Selena Batten (speacial guest speaker from [facility]) was able to mix/create a scent to counteract the symptom. Clients were able to ask multiple questions. 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  C/O decreased motivation.           Chestine Spore, RITA, M.Ed, CNA

## 2018-03-21 ENCOUNTER — Other Ambulatory Visit (HOSPITAL_COMMUNITY): Payer: Medicare Other

## 2018-03-21 NOTE — Progress Notes (Signed)
    Daily Group Progress Note  Program: IOP  Group Time: 9am-12pm  Participation Level: Active  Behavioral Response: Appropriate  Type of Therapy:  Group Therapy; psychoeducational group process group  Summary of Progress: The theme for group today was, "Mindfulness Practice." The therapist led the session by introducing the [place] game to the group and a psychoeducational activity on mindfulness. The group members participated by taking turns playing the game and actively answering the questions that prompted them to share their answers. As they shared their answers, they were able to bring awareness of their thoughts, core values, and expressed what helps them find the right balance to boost their self-esteem and confidence.  The group enjoyed the session and gave positive feedback from their experiences.  The patient shared with group that she was having difficulty keeping track of things. She's always running late. Trying to be perfect and beating herself up is her challenge. She learning how to take it easy on herself and allow herself to have a bad day.   Harlon Ditty, LCSW

## 2018-03-22 ENCOUNTER — Other Ambulatory Visit (HOSPITAL_COMMUNITY): Payer: Medicare Other | Admitting: Licensed Clinical Social Worker

## 2018-03-22 DIAGNOSIS — F331 Major depressive disorder, recurrent, moderate: Secondary | ICD-10-CM | POA: Diagnosis not present

## 2018-03-22 NOTE — Progress Notes (Signed)
    Daily Group Progress Note  Program: IOP  Group Time: 9am-12pm  Participation Level: Active  Behavioral Response: Appropriate  Type of Therapy:  Group Therapy; psychoeducational group, process group  Summary of Progress:  The purpose of this group is to utilize CBT in a group setting to increase knowledge and utilization of healthy coping skills and decrease frequency and intensity of mental health symptoms. 9am-11am Clinician checked in with group members assessing for SI/HI/psychosis.  Clinician provided psychoeducation physical response to emotions. Clinician presented information on the CBT triangle reviewing connection between thoughts, emotions, and behavioral responses. Clinician and group members discussed triggers such as places for strong emotions, even under different circumstances. Clinician encouraged group members to focus on identifying where feelings are felt in the body, then identifying current thoughts related to the emotion. Clinician discussed with clients 'Distressing But Not Dangerous' scenarios and encouraged clients to attempt to use a coping skill for at least 2 minutes in order to help 'ride the wave' of emotions. Clinician facilitated meditation focused on breathing and body scanning. Clinician also provided clients with box breathing technique which should be used at least 5-7 times for it to be effective. Clinician and clients practiced breathing skill in group.  11am-12pm Clinician presented 5-4-3-2-1 skill utilizing creating worry dolls. During activity time was allowed for clients to process progress made toward utilizing learned skills, including grounding and distraction skills as well as positive self talk.  Client presented to group fully oriented with appropriate dress, mood and affect within normal limits. Client participated fully in all group discussions and activities, noting progress made throughout program. Client reports she utilized assertive  communication skill to contact company related to father's land as well as contact doctor about uncomfortable cast. Client notes her initial response was to take off the cast herself however she identified that would not be helpful and therefore did not complete that behavior. Client stated during worry doll activity she was proud of herself for not focusing on being a perfectionist and being able to enjoy an activity without judgement. Client reports an overall decrease in symptoms however did have questions about the option for follow up group therapy. This will be discussed with case manager for aftercare planning.  Harlon Ditty, LCSW

## 2018-03-25 ENCOUNTER — Other Ambulatory Visit (HOSPITAL_COMMUNITY): Payer: Medicare Other | Admitting: Licensed Clinical Social Worker

## 2018-03-25 DIAGNOSIS — F331 Major depressive disorder, recurrent, moderate: Secondary | ICD-10-CM | POA: Diagnosis not present

## 2018-03-26 ENCOUNTER — Other Ambulatory Visit (HOSPITAL_COMMUNITY): Payer: Medicare Other | Attending: Psychiatry | Admitting: Licensed Clinical Social Worker

## 2018-03-26 DIAGNOSIS — F329 Major depressive disorder, single episode, unspecified: Secondary | ICD-10-CM | POA: Diagnosis not present

## 2018-03-26 DIAGNOSIS — Z79899 Other long term (current) drug therapy: Secondary | ICD-10-CM | POA: Diagnosis not present

## 2018-03-26 DIAGNOSIS — F331 Major depressive disorder, recurrent, moderate: Secondary | ICD-10-CM | POA: Diagnosis present

## 2018-03-26 DIAGNOSIS — F419 Anxiety disorder, unspecified: Secondary | ICD-10-CM | POA: Diagnosis not present

## 2018-03-26 DIAGNOSIS — Z915 Personal history of self-harm: Secondary | ICD-10-CM | POA: Insufficient documentation

## 2018-03-26 NOTE — Progress Notes (Signed)
    Daily Group Progress Note  Program: IOP  Group Time: 9am-12pm  Participation Level: Active  Behavioral Response: Appropriate and Sharing  Type of Therapy:  Group Therapy; process group, psychoeducational group  Summary of Progress:  The purpose of this group is to utilize CBT skills in a group setting to increase utilization of healthy coping skills and decrease intensity of mental health symptoms.  9am-10:30am Clinician started with assessing for SI/HI/psychosis/overall level of functioning. Clinician inquired about the weekend and any skills attempted and their effectiveness. Clinician presented guided mindfulness meditation for group to participate.  Clinician reviewed cognitive triangle and the connection between thoughts, emotions, and behaviors. Clinician and group members processed the barriers of implementing mindfulness skills in the moment, particularly with increasing levels of anxiety. Clinician and group members completed follow up activity for self mantras and ongoing positive self talk to help manage depressive symptoms.  10:30am-12pm Clinician reviewed DBT Emotional Regulation Skill of Mindfulness to 'ride the wave' of emotions. Clinician reminded group members of the importance of practicing mindfulness skills in daily life in order to gain mastery or skill and be able to use them when feeling overwhelmed. Clinician and group members processed the importance of and barriers to experiencing an emotion fully without holding onto or suppressing emotions. Clinician and group members processed fully feeling an emotion without reacting impulsively. Clinician provided clients with Quick Calm Down Skills, [employer] including breathing skill, progressive muscle relaxation.  Clinician inquired about self care activity to be completed over the rest of today. Client reports having a difficult weekend and trouble with unhelpful thoughts. Client completed empty chair activity and  reported feeling better after releasing negative energy. Client reflected, with the support of group members, she does not need to continue putting effort into relationships which do not currently benefit her, such as family. Client noted she does have friends who she can see as close as family members and can look to them for support. Client set a goal to memorize a quote from a group member related to self validation.   Harlon Ditty, LCSW

## 2018-03-26 NOTE — Progress Notes (Signed)
    Daily Group Progress Note  Program: IOP  Group Time: 9am-12pm  Participation Level: Active  Behavioral Response: Appropriate  Type of Therapy:  Group Therapy  Summary of Progress: The purpose of this group is to utilize CBT and DBT skills in a group setting to increase utilization of healthy coping skills and decrease intensity of mental health symptoms.  9am-11am The topic of group today is Distorted thinking Styles. Clinician presented group members with purpose of feelings worksheet. Clinician facilitated discussion on how emotions, at different intensities can alert our mind to positive and negative thoughts. Clinician presented worksheet on common distorted thinking styles and reviewed with clients. Clinician facilitated discussion between clients on examples of distorted thoughts and how these effect behaviors. Clinician and group members discussed ways to challenge distorted thoughts.  11am-12pm Chaplin presented to group to process with clients on the topic of grief and loss. Client discussed the loss of her father. Client discussed how she learned which emotions were ' okay' to show based on her family's responses when younger. Client was able to identify some distorted thoughts she recognized in herself and was able to create helpful alternatives which she reports lead to a different feeling or behavior. Client memorized mantra from group member and reports she is proud of herself for that and for wanting to believe it is true.   Harlon Ditty, LCSW

## 2018-03-27 ENCOUNTER — Encounter (HOSPITAL_COMMUNITY): Payer: Self-pay | Admitting: Family

## 2018-03-27 ENCOUNTER — Other Ambulatory Visit (HOSPITAL_COMMUNITY): Payer: Medicare Other | Admitting: Licensed Clinical Social Worker

## 2018-03-27 DIAGNOSIS — F331 Major depressive disorder, recurrent, moderate: Secondary | ICD-10-CM

## 2018-03-27 DIAGNOSIS — F329 Major depressive disorder, single episode, unspecified: Secondary | ICD-10-CM | POA: Diagnosis not present

## 2018-03-27 NOTE — Progress Notes (Signed)
  [facility] Intensive Outpatient Program Discharge Summary  Tina Pena [id number]  Admission date: 03/12/2018 Discharge date: 03/27/2018  Reason for admission: Worsening depression   Per assessment note for PHP- patient is a 62 y.o.[name] presents with depression.Reports she felt life her life"started to come apart after a suicide attempt"with cutting herself all over with a box cutter. 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 Reports diagnosis of bipolar, depression,anxiety and paranoia.patient is currently denying suicidal homicidal ideations. Reports she is followed by MD Hisada.Reports she is prescribed gabapentin 300 mg, Duloxetine 40 mg, clonazepam 1 mg PRN,reports she was recently initiated on Depakote 250 mg p.o. twice daily.Reports chronic medical health concerns related to auto immune disorder. Patient was enrolled in partial psychiatric program on 01/18/18   Family of Origin Issues: Improving, reports she is going to continue working on setting  boundaries with her family members.  Reports she is rebuilding her relationship with her children and ex husband.   Progress in Program Toward Treatment Goals: Ongoing, patient attended and participated during daily group sessions.  Tina Pena completed Partial hospitalization  (PHP)and Intensive Outpatient program (IOP) reports overall her mood has improved and states she is feeling better about herself.  Progress (rationale): Patient to follow up Tina Class, LCSW 04/04/2018@ 15:00. Reports she will f/u with the [facility] for further Psychiatric treatment.  Reports she will cancel her appointment with MD Hasid on 12/22 due to location.  Declined medication refill at this time.  Take all  medications as prescribed. Keep all follow-up appointments as scheduled.  Do not consume alcohol or use illegal drugs while on prescription medications. Report any adverse effects from your medications to your primary care provider promptly.  In the event of recurrent symptoms or worsening symptoms, call 911, a crisis hotline, or go to the nearest emergency department for evaluation.   Tina Rack, NP 03/27/2018

## 2018-03-27 NOTE — Progress Notes (Signed)
    Daily Group Progress Note  Program: IOP  Group Time: 9am-12pm  Participation Level: Active  Behavioral Response: Appropriate  Type of Therapy:  Group Therapy; process group and psychoeducational group  Summary of Progress:  The purpose of this group is to utilize CBT and DBT skills in a group setting t decrease active mental health symptoms and increase utilization of healthy coping skills.  9am-10:30am Clinician checked in a with group members assessing for SI/HI/psychosis and overall level of functioning. Clinician inquired about self care activity completed from the previous day and any coping skills used.  Clinician presented the topic of cognitive bias effect on decision making. Clinician provided video 'Don't believe everything you think' focused on unhelpful thoughts and feelings created which shape the way we respond to situations in the future. Clinician facilitated discussion with group members on incidents with consequences which changed behaviors. Clinician encouraged clients to identify specific thoughts related to the uncomfortable feelings they attempt to avoid by altering behaviors. Clinician reminded clients of the CBT triangle and the connection between thoughts, emotions, and behaviors.  10:30am-12pm Clinician presented cognitive biases worksheet and processed several biases which could be affecting decision making. Clinician and group members discussed ways to challenge unhelpful thoughts and offer alternative feelings and behaviors to similar situations. Clinician presented Emotional Equations worksheet and facilitated conversation about multiple thoughts causing mixed emotions.  Clinician and group members completed activity focused on positive personal self traits using who a client admires and why. Clinician explained that these positive traits they see in others are often a reflection of the positive traits they see, or try to be in themselves. Clinician and group  members discussed when others who are opposite of these values/priorities we often 'butt heads' with them. Client is completing IOP on this day. Client reports she has gained self confidence, ability to identify thoughts and emotions effecting her behaviors, and coping skills to manage intensity of emotions. Client identified that several of the cognitive bias' which served her well when working effect her mental health differently.    Harlon Ditty, LCSW

## 2018-03-27 NOTE — Patient Instructions (Signed)
D:  Patient completed MH-IOP today.  A:  Follow up with Sherre Scarlet, LCSW on 04-04-18 @ 3 pm and patient is planning to transfer to a psychiatrist at [facility].  Encouraged support groups.  R:  Pt receptive.
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
## 2018-03-28 ENCOUNTER — Other Ambulatory Visit (HOSPITAL_COMMUNITY): Payer: Medicare Other

## 2018-03-29 ENCOUNTER — Other Ambulatory Visit (HOSPITAL_COMMUNITY): Payer: Medicare Other

## 2018-04-01 ENCOUNTER — Other Ambulatory Visit (HOSPITAL_COMMUNITY): Payer: Medicare Other

## 2018-04-02 ENCOUNTER — Other Ambulatory Visit (HOSPITAL_COMMUNITY): Payer: Medicare Other

## 2018-04-03 ENCOUNTER — Other Ambulatory Visit (HOSPITAL_COMMUNITY): Payer: Medicare Other

## 2018-04-04 ENCOUNTER — Other Ambulatory Visit (HOSPITAL_COMMUNITY): Payer: Medicare Other

## 2018-04-05 ENCOUNTER — Other Ambulatory Visit (HOSPITAL_COMMUNITY): Payer: Medicare Other

## 2018-04-08 ENCOUNTER — Other Ambulatory Visit (HOSPITAL_COMMUNITY): Payer: Medicare Other

## 2018-04-09 ENCOUNTER — Other Ambulatory Visit (HOSPITAL_COMMUNITY): Payer: Medicare Other

## 2018-04-10 ENCOUNTER — Other Ambulatory Visit (HOSPITAL_COMMUNITY): Payer: Medicare Other

## 2018-04-11 ENCOUNTER — Other Ambulatory Visit (HOSPITAL_COMMUNITY): Payer: Medicare Other

## 2018-04-12 ENCOUNTER — Other Ambulatory Visit (HOSPITAL_COMMUNITY): Payer: Medicare Other

## 2018-04-15 ENCOUNTER — Other Ambulatory Visit (HOSPITAL_COMMUNITY): Payer: Medicare Other

## 2018-04-16 ENCOUNTER — Other Ambulatory Visit (HOSPITAL_COMMUNITY): Payer: Medicare Other

## 2018-04-17 ENCOUNTER — Other Ambulatory Visit (HOSPITAL_COMMUNITY): Payer: Medicare Other

## 2018-04-18 ENCOUNTER — Other Ambulatory Visit (HOSPITAL_COMMUNITY): Payer: Medicare Other

## 2018-04-19 ENCOUNTER — Other Ambulatory Visit (HOSPITAL_COMMUNITY): Payer: Medicare Other

## 2018-04-22 ENCOUNTER — Ambulatory Visit (HOSPITAL_COMMUNITY): Payer: Self-pay | Admitting: Psychiatry

## 2018-04-23 ENCOUNTER — Other Ambulatory Visit (HOSPITAL_COMMUNITY): Payer: Medicare Other

## 2018-04-24 ENCOUNTER — Other Ambulatory Visit (HOSPITAL_COMMUNITY): Payer: Medicare Other

## 2018-04-24 ENCOUNTER — Telehealth (HOSPITAL_COMMUNITY): Payer: Self-pay | Admitting: Psychiatry

## 2018-04-24 NOTE — Telephone Encounter (Signed)
Received request of clonazepam. Please contact the patient if she still needs this refill (if so, is it 1 mg daily as needed?). Also advise the patient to make follow up appointment.

## 2018-04-24 NOTE — Telephone Encounter (Signed)
LVM requesting  Call back for refill request

## 2018-04-25 ENCOUNTER — Other Ambulatory Visit (HOSPITAL_COMMUNITY): Payer: Medicare Other

## 2018-04-26 ENCOUNTER — Other Ambulatory Visit (HOSPITAL_COMMUNITY): Payer: Medicare Other

## 2018-04-29 ENCOUNTER — Other Ambulatory Visit (HOSPITAL_COMMUNITY): Payer: Medicare Other

## 2018-04-30 ENCOUNTER — Other Ambulatory Visit (HOSPITAL_COMMUNITY): Payer: Medicare Other

## 2018-05-01 ENCOUNTER — Other Ambulatory Visit (HOSPITAL_COMMUNITY): Payer: Medicare Other

## 2018-05-02 ENCOUNTER — Other Ambulatory Visit (HOSPITAL_COMMUNITY): Payer: Medicare Other

## 2018-05-03 ENCOUNTER — Other Ambulatory Visit (HOSPITAL_COMMUNITY): Payer: Medicare Other

## 2018-10-17 DIAGNOSIS — M79644 Pain in right finger(s): Secondary | ICD-10-CM | POA: Insufficient documentation

## 2018-10-24 DIAGNOSIS — M7531 Calcific tendinitis of right shoulder: Secondary | ICD-10-CM | POA: Insufficient documentation

## 2019-09-26 ENCOUNTER — Other Ambulatory Visit: Payer: Self-pay | Admitting: Physician Assistant

## 2019-09-26 DIAGNOSIS — R7989 Other specified abnormal findings of blood chemistry: Secondary | ICD-10-CM

## 2019-10-02 ENCOUNTER — Ambulatory Visit
Admission: RE | Admit: 2019-10-02 | Discharge: 2019-10-02 | Disposition: A | Discharge status: Home / Self Care | Payer: Medicare Other | Source: Ambulatory Visit | Attending: Physician Assistant | Admitting: Physician Assistant

## 2019-10-02 DIAGNOSIS — R7989 Other specified abnormal findings of blood chemistry: Secondary | ICD-10-CM

## 2020-07-15 DIAGNOSIS — N904 Leukoplakia of vulva: Secondary | ICD-10-CM | POA: Insufficient documentation

## 2020-08-10 ENCOUNTER — Ambulatory Visit (INDEPENDENT_AMBULATORY_CARE_PROVIDER_SITE_OTHER): Payer: Medicare Other

## 2020-08-10 ENCOUNTER — Other Ambulatory Visit: Payer: Self-pay

## 2020-08-10 ENCOUNTER — Ambulatory Visit (INDEPENDENT_AMBULATORY_CARE_PROVIDER_SITE_OTHER): Payer: Medicare Other | Admitting: Podiatry

## 2020-08-10 ENCOUNTER — Encounter: Payer: Self-pay | Admitting: Podiatry

## 2020-08-10 DIAGNOSIS — M2011 Hallux valgus (acquired), right foot: Secondary | ICD-10-CM

## 2020-08-10 DIAGNOSIS — M2012 Hallux valgus (acquired), left foot: Secondary | ICD-10-CM

## 2020-08-10 DIAGNOSIS — M778 Other enthesopathies, not elsewhere classified: Secondary | ICD-10-CM

## 2020-08-10 MED ORDER — DEXAMETHASONE SODIUM PHOSPHATE 120 MG/30ML IJ SOLN
2.0000 mg | Freq: Once | INTRAMUSCULAR | Status: AC
Start: 2020-08-10 — End: 2020-08-10
  Administered 2020-08-10: 2 mg via INTRA_ARTICULAR

## 2020-08-10 NOTE — Progress Notes (Signed)
Subjective:  Patient ID: Xylina Rhoads, female    DOB: [birth year],  MRN: [id number] HPI Chief Complaint  Patient presents with  . Foot Pain    1st MPJ right - bunion deformity x years, noticed recently having pain, sharp, stabbing when walking, dull ache if just sitting, certain shoes uncomfortable, worse with activity, left does have a deformity as well, but only intermittent discomfort  . New Patient (Initial Visit)    65 y.o. female presents with the above complaint.   ROS: Denies fever chills nausea vomiting muscle aches pains calf pain back pain chest pain shortness of breath.  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  Past Medical History:  Diagnosis Date  . Bipolar disorder [facility])    Past Surgical History:  Procedure Laterality Date  . APPENDECTOMY    . GALLBLADDER SURGERY  2001  . HERNIA REPAIR    . TONSILLECTOMY  1981  . toupetfundoplicatio  2000  . TUBAL LIGATION      Current Outpatient Medications:  .  pantoprazole (PROTONIX) 40 MG tablet, Take 40 mg by mouth daily., Disp: , Rfl:  .  Vilazodone HCl (VIIBRYD) 20 MG TABS, Take by mouth., Disp: , Rfl:  .  hydroxychloroquine (PLAQUENIL) 200 MG tablet, Take 200 mg by mouth 2 (two) times daily. 1 tab by mouth in AM, 1 tab by mouth in PM, Disp: , Rfl:  .  Loratadine-Pseudoephedrine (CLARITIN-D 24 HOUR PO), Take by mouth. 1 tab by mouth in AM as needed, Disp: , Rfl:  .  traMADol (ULTRAM) 50 MG tablet, Take 50 mg by mouth every 6 (six) hours as needed for moderate pain (6-8 per day depending on pain)., Disp: , Rfl:   Allergies  Allergen Reactions  . Codeine Nausea And Vomiting  . Garlic Nausea And Vomiting  . Other Nausea And Vomiting    Onions   . Sertraline Rash    Other reaction(s): Confusion   Review of Systems Objective:  There were no vitals filed for this visit.  General: Well developed, nourished, in no acute distress,  alert and oriented x3   Dermatological: Skin is warm, dry and supple bilateral. Nails x 10 are well maintained; remaining integument appears unremarkable at this time. There are no open sores, no preulcerative lesions, no rash or signs of infection present.  Vascular: Dorsalis Pedis artery and Posterior Tibial artery pedal pulses are 2/4 bilateral with immedate capillary fill time. Pedal hair growth present. No varicosities and no lower extremity edema present bilateral.   Neruologic: Grossly intact via light touch bilateral. Vibratory intact via tuning fork bilateral. Protective threshold with Semmes Wienstein monofilament intact to all pedal sites bilateral. Patellar and Achilles deep tendon reflexes 2+ bilateral. No Babinski or clonus noted bilateral.   Musculoskeletal: No gross boney pedal deformities bilateral. No pain, crepitus, or limitation noted with foot and ankle range of motion bilateral. Muscular strength 5/5 in all groups tested bilateral.  Mild hallux abductovalgus deformity bilaterally most likely hypertrophic medial condyle.  She has tenderness on palpation medial aspect of the first metatarsophalangeal joint as well as the dorsal lateral aspect.  Gait: Unassisted, Nonantalgic.    Radiographs:  Radiographs taken today demonstrate osseously mature individual with soft tissue increase in thickening at the first metatarsophalangeal joint.  Hypertrophic medial condyle is present very limited increase in the first intermetatarsal angle bilaterally.  Assessment & Plan:   Assessment: With increase in density at the medial first metatarsophalangeal  joint not at the bottom foot and the joint itself is questionable for rheumatic disease.  I do not see any bony breakdown at the interphalangeal joint of the foot the foot actually looks very good itself.  Plan: At this point we decided to inject the foot we also discussed surgical intervention.  Silicone injected at the first  metatarsophalangeal joint with dexamethasone local anesthetic after sterile Betadine skin prep.  I will follow-up with her in 6 weeks if necessary for a Kenalog injection.     Robertine Kipper T. H[place], [place]

## 2020-09-21 ENCOUNTER — Other Ambulatory Visit: Payer: Self-pay

## 2020-09-21 ENCOUNTER — Encounter: Payer: Self-pay | Admitting: Podiatry

## 2020-09-21 ENCOUNTER — Ambulatory Visit (INDEPENDENT_AMBULATORY_CARE_PROVIDER_SITE_OTHER): Payer: Medicare Other | Admitting: Podiatry

## 2020-09-21 DIAGNOSIS — M778 Other enthesopathies, not elsewhere classified: Secondary | ICD-10-CM | POA: Diagnosis not present

## 2020-09-21 MED ORDER — TRIAMCINOLONE ACETONIDE 40 MG/ML IJ SUSP
20.0000 mg | Freq: Once | INTRAMUSCULAR | Status: AC
Start: 2020-09-21 — End: 2020-09-21
  Administered 2020-09-21: 20 mg

## 2020-09-21 NOTE — Progress Notes (Signed)
She presents today for follow-up of her capsulitis states that the shot helped a lot.  Started to hurt again about a week or so ago.  Objective: Vital signs are stable she alert oriented x3 she has some tenderness on palpation of the first metatarsophalangeal joint.  Assessment: Capsulitis first metatarsophalangeal joint right foot.  Plan: At this point I injected about 5 mg of triamcinolone with local anesthetic after sterile Betadine skin prep to the dorsal aspect of the joint capsule into the joint.  We will follow-up with her in about a month to see how she is doing.  And I did recommend that we do 1 more injection at that time again a small milligram dose possibly a combination betamethasone and Kenalog.

## 2020-11-02 ENCOUNTER — Ambulatory Visit (INDEPENDENT_AMBULATORY_CARE_PROVIDER_SITE_OTHER): Payer: Medicare Other | Admitting: Podiatry

## 2020-11-02 ENCOUNTER — Other Ambulatory Visit: Payer: Self-pay

## 2020-11-02 ENCOUNTER — Encounter: Payer: Self-pay | Admitting: Podiatry

## 2020-11-02 DIAGNOSIS — M778 Other enthesopathies, not elsewhere classified: Secondary | ICD-10-CM | POA: Diagnosis not present

## 2020-11-02 MED ORDER — TRIAMCINOLONE ACETONIDE 40 MG/ML IJ SUSP
20.0000 mg | Freq: Once | INTRAMUSCULAR | Status: AC
  Administered 2020-11-02: 20 mg

## 2020-11-03 NOTE — Progress Notes (Signed)
She presents today states that the first shot helped as she refers to the first metatarsophalangeal joint but the second shot really did nothing.  Objective: Vital signs are stable alert and oriented x3.  Pulses are palpable.  At this point she still has pain on palpation and range of motion of the first metatarsophalangeal joint of the right foot.  Assessment capsulitis hallux limitus first metatarsophalangeal joint right foot.  Plan: Reinjected today into the joint after Betadine skin prep [facility] follow-up with me in 1 month

## 2021-01-20 ENCOUNTER — Ambulatory Visit: Payer: Medicare Other | Admitting: Podiatry

## 2021-03-24 IMAGING — US US ABDOMEN LIMITED
1 series · 13 of 25 positions shown · non-contrast
Comparison: No recent available.
COMPARISON: No recent available.

Addendum:
CLINICAL DATA: Elevated LFTs.

EXAM:
ULTRASOUND ABDOMEN LIMITED RIGHT UPPER QUADRANT

[Series 1: us abdomen limited · 0.17mm/px · 13 of 46 slices shown]
[im 1/46]
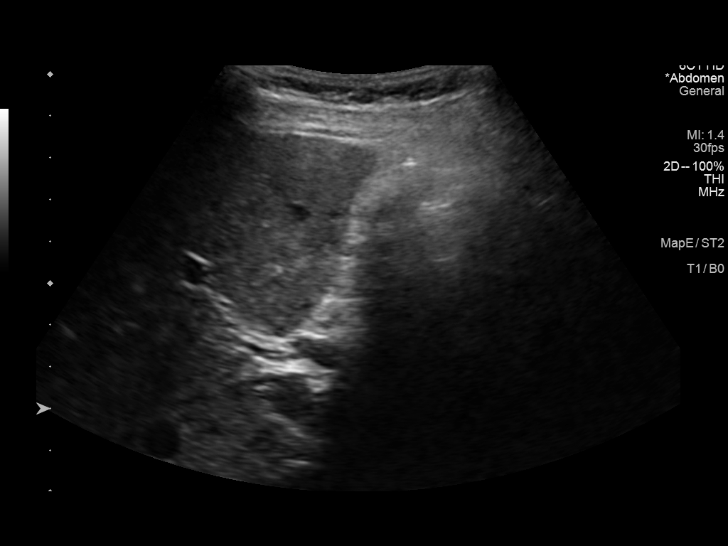
[im 4/46]
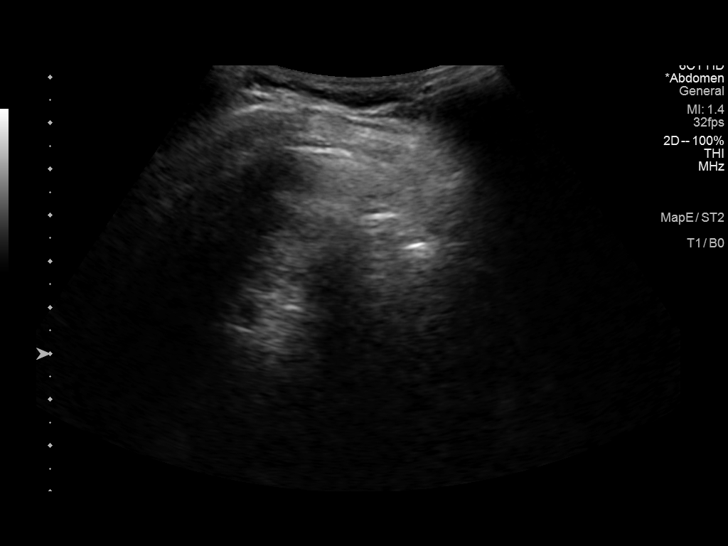
[im 8/46]
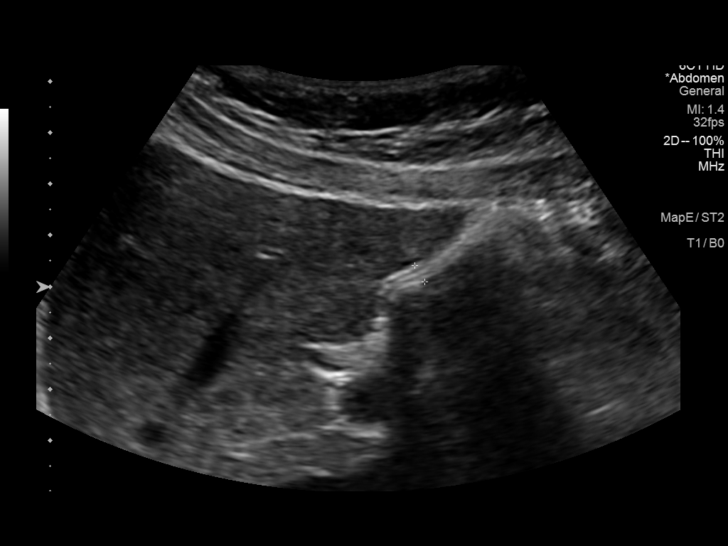
[im 12/46]
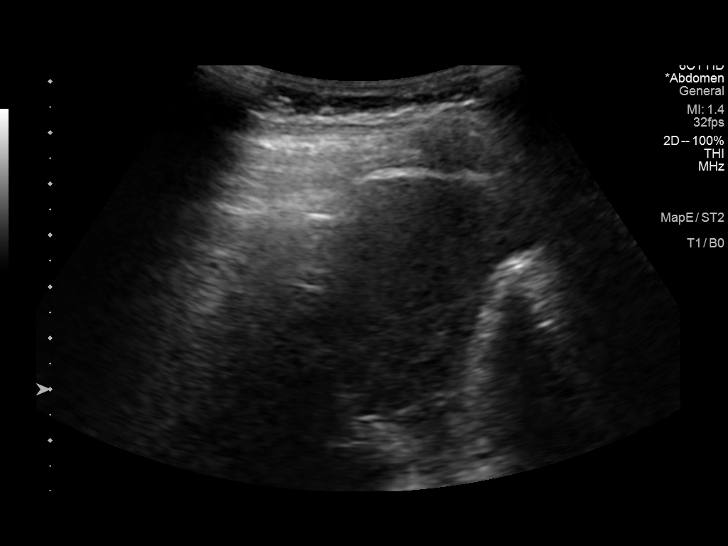
[im 16/46]
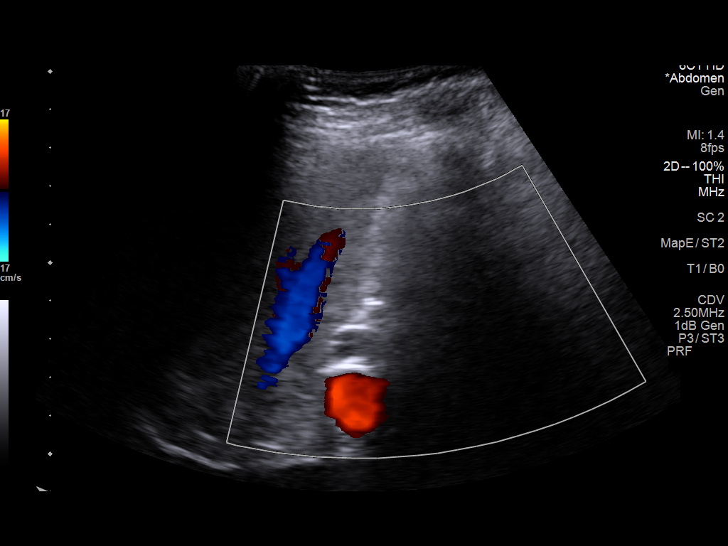
[im 19/46]
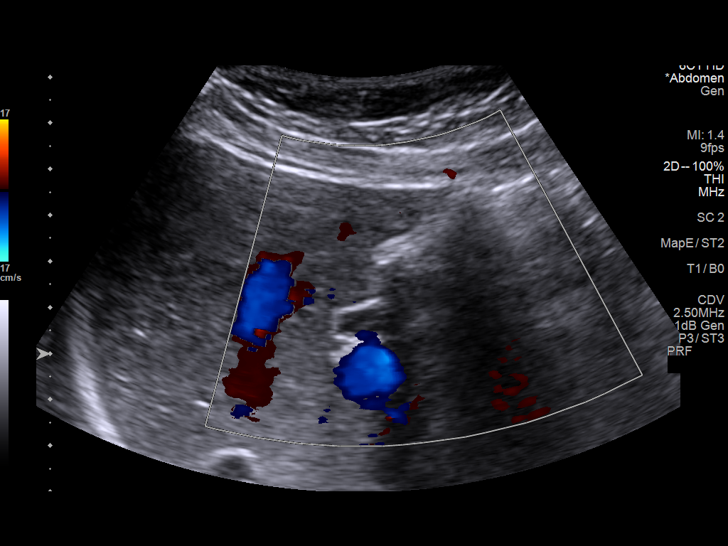
[im 23/46]
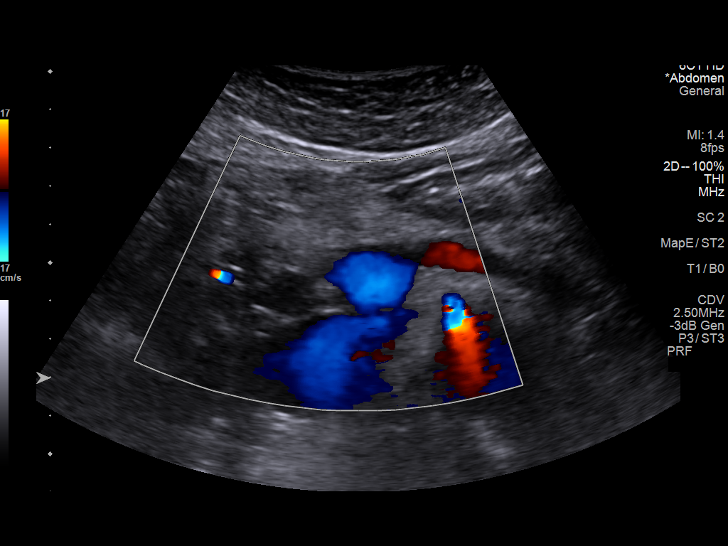
[im 27/46]
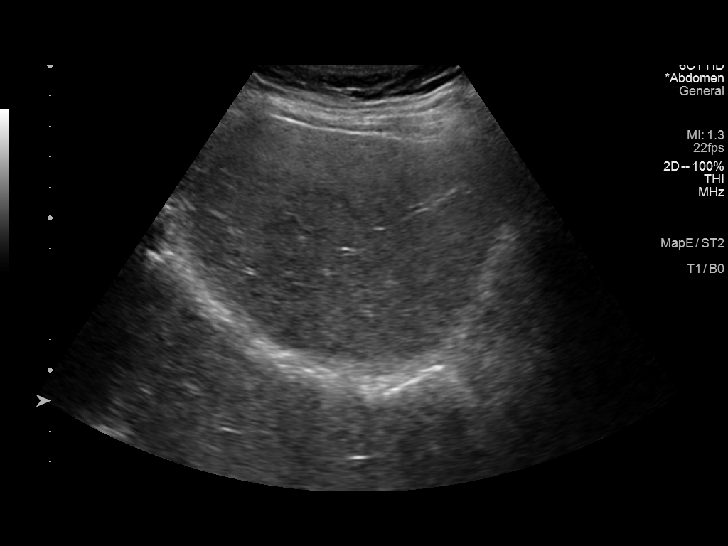
[im 31/46]
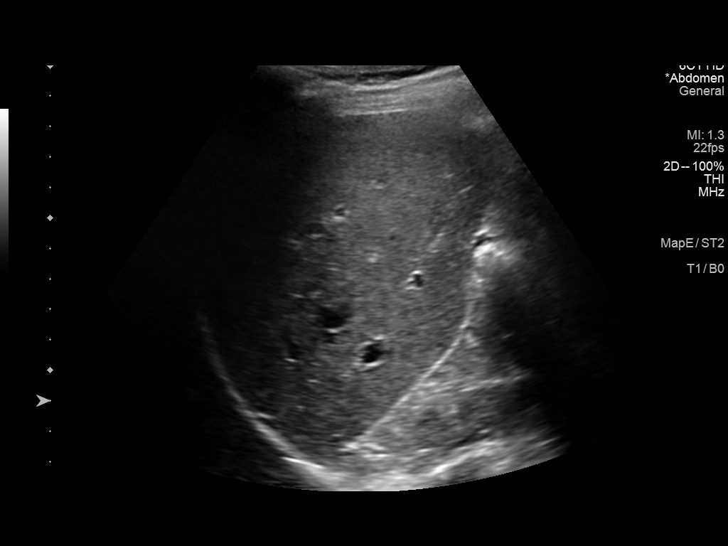
[im 34/46]
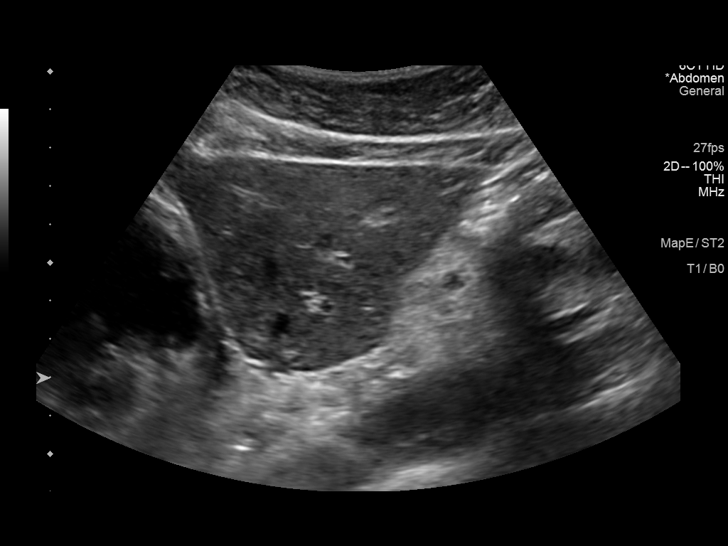
[im 38/46]
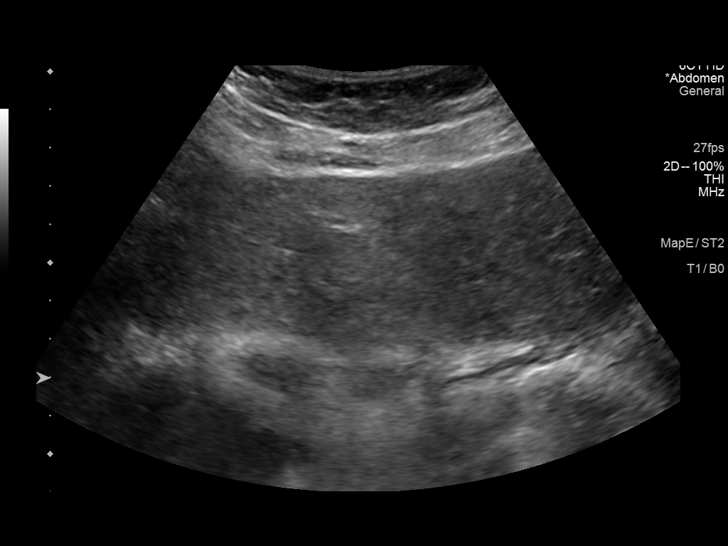
[im 42/46]
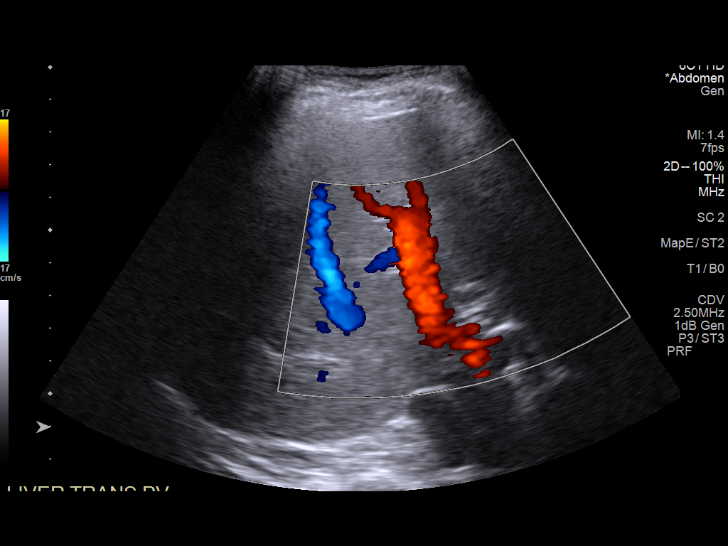
[im 46/46]
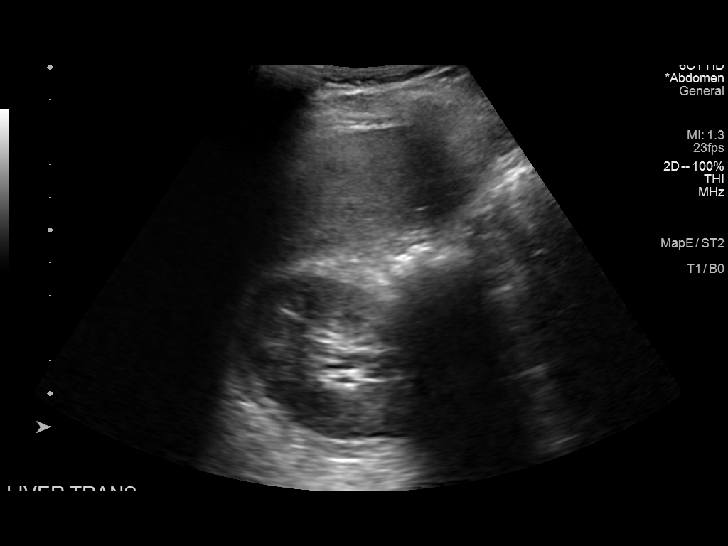

[13 of 25 positions shown; findings below may reference images not displayed]

FINDINGS: Gallbladder:

Questionable wall echo sign. Gallstones in a contracted gallbladder
cannot be excluded. Questionable gallbladder wall thickening up to
3.7 mm. Negative Murphy sign.

Common bile duct:

Diameter: 12.3 mm

Liver:

No focal lesion identified. Within normal limits in parenchymal
echogenicity. Portal vein is patent on color Doppler imaging with
normal direction of blood flow towards the liver.

Other: None.
IMPRESSION: 1. Questionable gallbladder wall echo sign. Gallstones in a
contracted gallbladder cannot be excluded. Questionable gallbladder
wall thickening. Cholecystitis cannot be excluded.

2. Common bile duct distention to 12.3 mm. MRI of the abdomen with
MRCP may prove useful for further evaluation.

ADDENDUM:
By report the patient has had a prior cholecystectomy. The above
described questionable wall echo sign and thickened gallbladder wall
most likely represents adjacent bowel.

*** End of Addendum ***
FINDINGS: Gallbladder:

Questionable wall echo sign. Gallstones in a contracted gallbladder
cannot be excluded. Questionable gallbladder wall thickening up to
3.7 mm. Negative Murphy sign.

Common bile duct:

Diameter: 12.3 mm

Liver:

No focal lesion identified. Within normal limits in parenchymal
echogenicity. Portal vein is patent on color Doppler imaging with
normal direction of blood flow towards the liver.

Other: None.
IMPRESSION: 1. Questionable gallbladder wall echo sign. Gallstones in a
contracted gallbladder cannot be excluded. Questionable gallbladder
wall thickening. Cholecystitis cannot be excluded.

2. Common bile duct distention to 12.3 mm. MRI of the abdomen with
MRCP may prove useful for further evaluation.

## 2021-04-27 ENCOUNTER — Encounter: Payer: Self-pay | Admitting: *Deleted

## 2021-04-27 ENCOUNTER — Ambulatory Visit: Payer: Medicare Other | Admitting: Neurology

## 2021-04-27 ENCOUNTER — Other Ambulatory Visit: Payer: Self-pay | Admitting: *Deleted

## 2021-04-28 ENCOUNTER — Ambulatory Visit (INDEPENDENT_AMBULATORY_CARE_PROVIDER_SITE_OTHER): Payer: Medicare Other | Admitting: Psychiatry

## 2021-04-28 ENCOUNTER — Encounter: Payer: Self-pay | Admitting: Psychiatry

## 2021-04-28 VITALS — BP 116/72 | HR 89 | Ht 68.0 in | Wt 161.0 lb

## 2021-04-28 DIAGNOSIS — G43109 Migraine with aura, not intractable, without status migrainosus: Secondary | ICD-10-CM

## 2021-04-28 MED ORDER — NURTEC 75 MG PO TBDP: 75.0000 mg | ORAL_TABLET | ORAL | 0 refills | Status: DC | PRN
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
## 2021-04-28 NOTE — Patient Instructions (Signed)
Start Nurtec as needed for migraines. Dissolve one pill on tongue at the onset of migraine. Can repeat a dose in 24 hours if headache persists Try neck PT stretches at home Take magnesium 400 mg daily for migraine prevention

## 2021-09-16 DIAGNOSIS — G5601 Carpal tunnel syndrome, right upper limb: Secondary | ICD-10-CM | POA: Insufficient documentation

## 2021-09-16 DIAGNOSIS — M79641 Pain in right hand: Secondary | ICD-10-CM | POA: Insufficient documentation

## 2021-09-16 DIAGNOSIS — G5602 Carpal tunnel syndrome, left upper limb: Secondary | ICD-10-CM | POA: Insufficient documentation

## 2021-10-20 ENCOUNTER — Encounter: Payer: Self-pay | Admitting: Physician Assistant

## 2021-10-20 ENCOUNTER — Ambulatory Visit (INDEPENDENT_AMBULATORY_CARE_PROVIDER_SITE_OTHER): Payer: Medicare Other | Admitting: Physician Assistant

## 2021-10-20 VITALS — BP 124/78 | HR 82 | Temp 97.7°F | Ht 68.0 in | Wt 161.4 lb

## 2021-10-20 DIAGNOSIS — M542 Cervicalgia: Secondary | ICD-10-CM

## 2021-10-20 DIAGNOSIS — G8929 Other chronic pain: Secondary | ICD-10-CM | POA: Diagnosis not present

## 2021-10-20 DIAGNOSIS — M5412 Radiculopathy, cervical region: Secondary | ICD-10-CM

## 2021-10-20 MED ORDER — GABAPENTIN 300 MG PO CAPS: 300.0000 mg | ORAL_CAPSULE | Freq: Every day | ORAL | 0 refills | Status: DC

## 2021-10-20 MED ORDER — TRAMADOL HCL 50 MG PO TABS: 50.0000 mg | ORAL_TABLET | Freq: Four times a day (QID) | ORAL | 0 refills | Status: DC | PRN

## 2021-10-20 NOTE — Patient Instructions (Signed)
Very good to meet you today! ?I have refilled your tramadol and gabapentin. ?Referral to neurosurgeon placed. ? ?Call sooner if any concerns.  ?

## 2021-10-20 NOTE — Progress Notes (Signed)
? ?Subjective:  ? ? Patient ID: Tina Pena, female    DOB: July 20, 1955, 66 y.o.   MRN: [id number] ? ?Chief Complaint  ?Patient presents with  ? Establish Care  ? Arthritis  ?  She was seen by [facility] for pain management for treatment. She is requesting refills on Tramadol.   ? Back Pain  ?  Herniated Disc  ? ? ?HPI ?66 y.o. patient presents today for new patient establishment with me.  Patient was previously established with Newton Pigg, NP, [facility]. ? ?Current Care Team: ?Dr. Dierdre Forth - Rhuematology ?Dr. Delena Bali - Neurology  ?Dr. Al Corpus - Podiatry ? ?Acute Concerns: ?History of chronic pain - herniated discs in her neck from collisions in [place]; OA and RA.  ?-Has been taking Tramadol 50 mg daily at night for severe pain, managed by [facility] previously; old Rx for gabapentin ?-saw neurosurgeon about 1.5 years ago, told to just monitor ?-neck pain causing migraines ?-Uses ice / heat / exercises  ?-received injections in 2011 in [place] ? ? ?Past Medical History:  ?Diagnosis Date  ? Acid reflux   ? Barrett's esophagus   ? Bipolar disorder (HCC)   ? Cervical herniated disc   ? Chronic pain   ? Depression   ? Dermatomyositis (HCC)   ? H/O vitamin D deficiency   ? Hypercholesterolemia   ? Ocular migraine   ? Osteoporosis   ? ? ?Past Surgical History:  ?Procedure Laterality Date  ? APPENDECTOMY    ? GALLBLADDER SURGERY  2001  ? HERNIA REPAIR    ? TONSILLECTOMY  1981  ? toupetfundoplicatio  2000  ? TUBAL LIGATION    ? ? ?Family History  ?Problem Relation Age of Onset  ? Cancer Father   ?     breast  ? Bipolar disorder Father   ? Anxiety disorder Father   ? Depression Father   ? Paranoid behavior Father   ? Heart disease Father   ? Stroke Maternal Grandmother   ? Hypertension Maternal Grandmother   ? Bipolar disorder Maternal Grandmother   ? Dementia Maternal Grandmother   ? Hypertension Maternal Grandfather   ? Stroke Paternal Grandmother   ? Hypertension Paternal Grandmother   ?  Bipolar disorder Paternal Grandmother   ? Dementia Paternal Grandmother   ? Hypertension Paternal Grandfather   ? ? ?Social History  ? ?Tobacco Use  ? Smoking status: Never  ? Smokeless tobacco: Never  ?Vaping Use  ? Vaping Use: Never used  ?Substance Use Topics  ? Alcohol use: Not Currently  ?  Comment: occasional glass of wine  ? Drug use: Never  ?  ? ?Allergies  ?Allergen Reactions  ? Codeine Nausea And Vomiting  ? Garlic Nausea And Vomiting  ? Other Nausea And Vomiting  ?  Onions   ? Sertraline Rash  ?  Other reaction(s): Confusion  ? ? ?Review of Systems ?NEGATIVE UNLESS OTHERWISE INDICATED IN HPI ? ? ?   ?Objective:  ?  ? ?BP 124/78   Pulse 82   Temp 97.7 ?F (36.5 ?C) (Temporal)   Ht 5\' 8"  (1.727 m)   Wt 161 lb 6.4 oz (73.2 kg)   SpO2 96%   BMI 24.54 kg/m?  ? ?Wt Readings from Last 3 Encounters:  ?10/20/21 161 lb 6.4 oz (73.2 kg)  ?04/28/21 161 lb (73 kg)  ? ? ?BP Readings from Last 3 Encounters:  ?10/20/21 124/78  ?04/28/21 116/72  ?  ? ?Physical Exam ?Constitutional:   ?  Appearance: Normal appearance.  ?HENT:  ?   Head: Normocephalic.  ?Neck:  ?   Thyroid: No thyroid tenderness.  ?Cardiovascular:  ?   Rate and Rhythm: Normal rate and regular rhythm.  ?   Pulses: Normal pulses.  ?   Heart sounds: Normal heart sounds. No murmur heard. ?Pulmonary:  ?   Effort: Pulmonary effort is normal. No respiratory distress.  ?   Breath sounds: Normal breath sounds.  ?Musculoskeletal:  ?   Cervical back: No rigidity or crepitus. Decreased range of motion (left rotation and flexion).  ?Lymphadenopathy:  ?   Cervical: No cervical adenopathy.  ?Neurological:  ?   General: No focal deficit present.  ?   Mental Status: She is alert and oriented to person, place, and time.  ?   Motor: No weakness.  ?   Gait: Gait normal.  ?   Comments: Normal grip strength upper extremities. ?Describes tingling all along both arms.  ?Psychiatric:     ?   Mood and Affect: Mood normal.  ?   Comments: Slight tearfulness discussing chronic  pain  ? ? ?   ?Assessment & Plan:  ? ?Problem List Items Addressed This Visit   ? ?  ? Nervous and Auditory  ? Cervical radiculopathy  ? Relevant Medications  ? gabapentin (NEURONTIN) 300 MG capsule  ? Other Relevant Orders  ? Ambulatory referral to Neurosurgery  ?  ? Other  ? Neck pain, chronic - Primary  ? Relevant Medications  ? traMADol (ULTRAM) 50 MG tablet  ? gabapentin (NEURONTIN) 300 MG capsule  ? Other Relevant Orders  ? Ambulatory referral to Neurosurgery  ? ? ? ?Meds ordered this encounter  ?Medications  ? traMADol (ULTRAM) 50 MG tablet  ?  Sig: Take 1 tablet (50 mg total) by mouth every 6 (six) hours as needed for moderate pain (6-8 per day depending on pain).  ?  Dispense:  30 tablet  ?  Refill:  0  ?  Order Specific Question:   Supervising Provider  ?  Answer:   Shelva Majestic [[id number]]  ? gabapentin (NEURONTIN) 300 MG capsule  ?  Sig: Take 1 capsule (300 mg total) by mouth at bedtime. Take for nerve pain.  ?  Dispense:  30 capsule  ?  Refill:  0  ?  Order Specific Question:   Supervising Provider  ?  Answer:   Shelva Majestic [[id number]]  ? ?1. Neck pain, chronic ?2. Cervical radiculopathy ?PDMP reviewed today, no red flags, filling appropriately.  ?Contract signed today. Refilled Tramadol 50 mg 1 tab qhs prn severe pain #30.  ?Gabapentin 300 mg 1 tab qhs for neuropathic pain- may trial this and let me know how she does.  ?Referral to neurosurgery for second opinion, further options in management. ? ?F/up 3 months or prn  ? ? ?This note was prepared with assistance of Conservation officer, historic buildings. Occasional wrong-word or sound-a-like substitutions may have occurred due to the inherent limitations of voice recognition software. ? ? ?Peniel Hass M Roan Sawchuk, PA-C ?

## 2021-10-27 ENCOUNTER — Ambulatory Visit: Payer: Medicare Other | Admitting: Psychiatry

## 2021-11-10 ENCOUNTER — Other Ambulatory Visit: Payer: Self-pay | Admitting: Physician Assistant

## 2021-11-15 ENCOUNTER — Other Ambulatory Visit: Payer: Self-pay | Admitting: Physician Assistant

## 2021-11-18 ENCOUNTER — Ambulatory Visit (INDEPENDENT_AMBULATORY_CARE_PROVIDER_SITE_OTHER): Payer: Medicare Other | Admitting: Physician Assistant

## 2021-11-18 ENCOUNTER — Encounter: Payer: Self-pay | Admitting: Physician Assistant

## 2021-11-18 VITALS — BP 112/66 | HR 90 | Temp 97.7°F | Ht 68.0 in | Wt 158.6 lb

## 2021-11-18 DIAGNOSIS — Z9889 Other specified postprocedural states: Secondary | ICD-10-CM

## 2021-11-18 DIAGNOSIS — R1013 Epigastric pain: Secondary | ICD-10-CM | POA: Diagnosis not present

## 2021-11-18 DIAGNOSIS — R110 Nausea: Secondary | ICD-10-CM

## 2021-11-18 LAB — COMPREHENSIVE METABOLIC PANEL
ALT: 25 U/L (ref 0–35)
AST: 29 U/L (ref 0–37)
Albumin: 4.1 g/dL (ref 3.5–5.2)
Alkaline Phosphatase: 81 U/L (ref 39–117)
BUN: 17 mg/dL (ref 6–23)
CO2: 31 mEq/L (ref 19–32)
Calcium: 9.3 mg/dL (ref 8.4–10.5)
Chloride: 101 mEq/L (ref 96–112)
Creatinine, Ser: 0.81 mg/dL (ref 0.40–1.20)
GFR: 75.72 mL/min (ref >60.00)
Glucose, Bld: 73 mg/dL (ref 70–99)
Potassium: 4.2 mEq/L (ref 3.5–5.1)
Sodium: 138 mEq/L (ref 135–145)
Total Bilirubin: 0.4 mg/dL (ref 0.2–1.2)
Total Protein: 6.7 g/dL (ref 6.0–8.3)

## 2021-11-18 LAB — CBC WITH DIFFERENTIAL/PLATELET
Basophils Absolute: 0.1 10*3/uL (ref 0.0–0.1)
Basophils Relative: 1.8 % (ref 0.0–3.0)
Eosinophils Absolute: 0.4 10*3/uL (ref 0.0–0.7)
Eosinophils Relative: 7.6 % — ABNORMAL HIGH (ref 0.0–5.0)
HCT: 38.5 % (ref 36.0–46.0)
Hemoglobin: 12.9 g/dL (ref 12.0–15.0)
Lymphocytes Relative: 14.2 % (ref 12.0–46.0)
Lymphs Abs: 0.7 10*3/uL (ref 0.7–4.0)
MCHC: 33.4 g/dL (ref 30.0–36.0)
MCV: 90.3 fl (ref 78.0–100.0)
Monocytes Absolute: 0.3 10*3/uL (ref 0.1–1.0)
Monocytes Relative: 7.2 % (ref 3.0–12.0)
Neutro Abs: 3.3 10*3/uL (ref 1.4–7.7)
Neutrophils Relative %: 69.2 % (ref 43.0–77.0)
Platelets: 253 10*3/uL (ref 150.0–400.0)
RBC: 4.26 Mil/uL (ref 3.87–5.11)
RDW: 12.8 % (ref 11.5–15.5)
WBC: 4.7 10*3/uL (ref 4.0–10.5)

## 2021-11-18 LAB — LIPASE: Lipase: 39 U/L (ref 11.0–59.0)

## 2021-11-18 NOTE — Patient Instructions (Addendum)
Good to see you again today! Let's check labs and make sure everything looks ok there. Stick to your bland diet as this is helping. Referral to GI given your history. Cont on Protonix 40 mg daily.  Recheck sooner if worse or any changes.

## 2021-11-18 NOTE — Progress Notes (Signed)
Subjective:    Patient ID: Tina Pena, female    DOB: [birth year], 66 y.o.   MRN: [id number]  Chief Complaint  Patient presents with   Nausea    Patient complains of nausea, x2 weeks     HPI Patient is in today for nausea x 2 weeks.  Takes Protonix 40 mg at night Epigastric pain and GERD Better this week than last week, but still present Bland diet seems to be helping Felt like she was going to be sick last week & like "someone punched her in the stomach", but never had vomiting, no diarrhea A lot of difficulty swallowing, not uncommon for her Fundoplication hx & Barrett's (?) hx Last saw GI in 2018 in [place], has not seen anyone in [place]  No CP, no SOB, no HA, no V/D  No recent illnesses   Past Medical History:  Diagnosis Date   Acid reflux    Allergy    Arthritis    Barrett's esophagus    Bipolar disorder (HCC)    Cervical herniated disc    Chronic pain    Depression    Dermatomyositis (HCC)    H/O vitamin D deficiency    Hypercholesterolemia    Ocular migraine    Osteoporosis     Past Surgical History:  Procedure Laterality Date   APPENDECTOMY     GALLBLADDER SURGERY  2001   HERNIA REPAIR     TONSILLECTOMY  1981   toupetfundoplicatio  2000   TUBAL LIGATION      Family History  Problem Relation Age of Onset   Cancer Father        breast   Bipolar disorder Father    Anxiety disorder Father    Depression Father    Paranoid behavior Father    Heart disease Father    Stroke Maternal Grandmother    Hypertension Maternal Grandmother    Bipolar disorder Maternal Grandmother    Dementia Maternal Grandmother    Hypertension Maternal Grandfather    Stroke Paternal Grandmother    Hypertension Paternal Grandmother    Bipolar disorder Paternal Grandmother    Dementia Paternal Grandmother    Hypertension Paternal Grandfather     Social History   Tobacco Use   Smoking status: Never   Smokeless tobacco: Never  Vaping Use   Vaping Use: Never used   Substance Use Topics   Alcohol use: Not Currently    Alcohol/week: 2.0 standard drinks    Types: 2 Glasses of wine per week    Comment: occasional glass of wine   Drug use: Never     Allergies  Allergen Reactions   Codeine Nausea And Vomiting   Garlic Nausea And Vomiting   Other Nausea And Vomiting    Onions    Sertraline Rash    Other reaction(s): Confusion    Review of Systems NEGATIVE UNLESS OTHERWISE INDICATED IN HPI      Objective:     BP 112/66 (BP Location: Left Arm, Patient Position: Sitting, Cuff Size: Normal)   Pulse 90   Temp 97.7 F (36.5 C) (Oral)   Ht 5\' 8"  (1.727 m)   Wt 158 lb 9.6 oz (71.9 kg)   SpO2 99%   BMI 24.12 kg/m   Wt Readings from Last 3 Encounters:  11/18/21 158 lb 9.6 oz (71.9 kg)  10/20/21 161 lb 6.4 oz (73.2 kg)  04/28/21 161 lb (73 kg)    BP Readings from Last 3 Encounters:  11/18/21 112/66  10/20/21 124/78  04/28/21 116/72     Physical Exam Constitutional:      Appearance: Normal appearance.  Cardiovascular:     Rate and Rhythm: Normal rate and regular rhythm.     Pulses: Normal pulses.     Heart sounds: Normal heart sounds. No murmur heard. Pulmonary:     Effort: Pulmonary effort is normal.     Breath sounds: Normal breath sounds.  Abdominal:     General: Abdomen is flat. Bowel sounds are normal.     Palpations: Abdomen is soft.     Tenderness: There is abdominal tenderness in the epigastric area. There is no right CVA tenderness, left CVA tenderness, guarding or rebound.  Neurological:     Mental Status: She is alert.       Assessment & Plan:   Problem List Items Addressed This Visit   None Visit Diagnoses     Epigastric pain    -  Primary   Relevant Orders   CBC with Differential/Platelet   Comprehensive metabolic panel   Lipase   Ambulatory referral to Gastroenterology   Nausea       Relevant Orders   CBC with Differential/Platelet   Comprehensive metabolic panel   Lipase   Ambulatory referral to  Gastroenterology   History of fundoplication       Relevant Orders   Ambulatory referral to Gastroenterology      PLAN: Let's check labs - CBC, CMP, lipase, and make sure everything looks ok there. Stick to your bland diet as this is helping. Referral to GI given your history. Cont on Protonix 40 mg daily.  Recheck prn. ED if acutely worse   This note was prepared with assistance of Dragon voice recognition software. Occasional wrong-word or sound-a-like substitutions may have occurred due to the inherent limitations of voice recognition software.    Carmon Sahli M Maycie Luera, PA-C

## 2021-11-22 ENCOUNTER — Other Ambulatory Visit: Payer: Self-pay | Admitting: Physician Assistant

## 2021-11-22 ENCOUNTER — Telehealth: Payer: Self-pay | Admitting: Physician Assistant

## 2021-11-22 MED ORDER — SUCRALFATE 1 GM/10ML PO SUSP
1.0000 g | Freq: Three times a day (TID) | ORAL | 0 refills | Status: DC
Start: 2021-11-22 — End: 2022-03-02

## 2021-11-22 NOTE — Telephone Encounter (Signed)
Pt was seen on 11/18/21 for stomach issues. She stated she forgot to ask for Carafate. She is asking for it to be called in for her.  PHARMACY:  [id number] - [place], [place] - [address] Phone:  [phone]  Fax:  [phone]

## 2021-11-22 NOTE — Telephone Encounter (Signed)
See note and advise please

## 2021-11-29 HISTORY — PX: RETINAL DETACHMENT SURGERY: SHX105

## 2021-12-02 ENCOUNTER — Telehealth: Payer: Self-pay | Admitting: Physician Assistant

## 2021-12-02 NOTE — Telephone Encounter (Signed)
Last visit: 11/18/21  Next visit: 01/17/22  Last filled: 11/10/21  Quantity: 30 w/ 0 refills

## 2021-12-02 NOTE — Telephone Encounter (Signed)
Early refill request. Agreed to #30 for 30 days. If needing to increase dose, needs to be seen.

## 2021-12-05 NOTE — Telephone Encounter (Signed)
FYI--Patient called on 12/05/21 in regards to refill of tramadol 50 mg and gabapentin 300 mg. She would like to be contacted at [phone] once this has been sent to the pharmacy. She is set to come for an [place] on 01/17/22.

## 2021-12-06 NOTE — Telephone Encounter (Signed)
Pt states she did not receive a call back after request on 12/05/21.  Pt states she will be availble the rest of the day.

## 2021-12-06 NOTE — Telephone Encounter (Signed)
FYI--Patient called back to reschedule due to having another obligation on 12/07/21. I was able to reschedule her for 12/08/21 at 7:30 am.

## 2021-12-06 NOTE — Telephone Encounter (Signed)
Returned pt call verified DOB; pt scheduled an appt for office visit tomorrow 12/07/21 to discuss dosage and refills on medication

## 2021-12-07 ENCOUNTER — Ambulatory Visit: Payer: Medicare Other | Admitting: Physician Assistant

## 2021-12-08 ENCOUNTER — Ambulatory Visit (INDEPENDENT_AMBULATORY_CARE_PROVIDER_SITE_OTHER): Payer: Medicare Other | Admitting: Physician Assistant

## 2021-12-08 ENCOUNTER — Encounter: Payer: Self-pay | Admitting: Physician Assistant

## 2021-12-08 VITALS — BP 124/76 | HR 80 | Temp 98.0°F | Ht 68.0 in | Wt 158.6 lb

## 2021-12-08 DIAGNOSIS — M5412 Radiculopathy, cervical region: Secondary | ICD-10-CM

## 2021-12-08 DIAGNOSIS — M542 Cervicalgia: Secondary | ICD-10-CM

## 2021-12-08 DIAGNOSIS — Z9889 Other specified postprocedural states: Secondary | ICD-10-CM | POA: Diagnosis not present

## 2021-12-08 DIAGNOSIS — H3321 Serous retinal detachment, right eye: Secondary | ICD-10-CM | POA: Diagnosis not present

## 2021-12-08 DIAGNOSIS — G8929 Other chronic pain: Secondary | ICD-10-CM

## 2021-12-08 MED ORDER — PANTOPRAZOLE SODIUM 40 MG PO TBEC: 40.0000 mg | DELAYED_RELEASE_TABLET | Freq: Every day | ORAL | 1 refills | Status: DC

## 2021-12-08 MED ORDER — GABAPENTIN 300 MG PO CAPS: 300.0000 mg | ORAL_CAPSULE | Freq: Every day | ORAL | 0 refills | Status: DC

## 2021-12-08 MED ORDER — TRAMADOL HCL 50 MG PO TABS: ORAL_TABLET | ORAL | 0 refills | Status: DC

## 2021-12-08 NOTE — Progress Notes (Signed)
Subjective:    Patient ID: Tina Pena, female    DOB: [birth year], 66 y.o.   MRN: [id number]  Chief Complaint  Patient presents with   Medication Refill    Pt being seen today to discuss medication and refills; had recent eye surgery for detached retina in right eye, this is another reason why she has been using more gabapentin and tramadol. Pt also requesting Protonix to be refilled.    Medication Refill   Patient is in today for medication discussion.  Recently had detached R retina emergent surgery. Having to lay on her left side, left side of the neck causing more pain. Laying flat on stomach at night as well due to the surgery.  -Tramadol and gabapentin needing refills today -MRI of neck about 3 weeks ago. Will f/up with neurosurgeon for next steps.    Past Medical History:  Diagnosis Date   Acid reflux    Allergy    Arthritis    Barrett's esophagus    Bipolar disorder (HCC)    Cervical herniated disc    Chronic pain    Depression    Dermatomyositis (HCC)    H/O vitamin D deficiency    Hypercholesterolemia    Ocular migraine    Osteoporosis     Past Surgical History:  Procedure Laterality Date   APPENDECTOMY     GALLBLADDER SURGERY  2001   HERNIA REPAIR     TONSILLECTOMY  1981   toupetfundoplicatio  2000   TUBAL LIGATION      Family History  Problem Relation Age of Onset   Cancer Father        breast   Bipolar disorder Father    Anxiety disorder Father    Depression Father    Paranoid behavior Father    Heart disease Father    Stroke Maternal Grandmother    Hypertension Maternal Grandmother    Bipolar disorder Maternal Grandmother    Dementia Maternal Grandmother    Hypertension Maternal Grandfather    Stroke Paternal Grandmother    Hypertension Paternal Grandmother    Bipolar disorder Paternal Grandmother    Dementia Paternal Grandmother    Hypertension Paternal Grandfather     Social History   Tobacco Use   Smoking status: Never    Smokeless tobacco: Never  Vaping Use   Vaping Use: Never used  Substance Use Topics   Alcohol use: Not Currently    Alcohol/week: 2.0 standard drinks of alcohol    Types: 2 Glasses of wine per week    Comment: occasional glass of wine   Drug use: Never     Allergies  Allergen Reactions   Codeine Nausea And Vomiting   Garlic Nausea And Vomiting   Other Nausea And Vomiting    Onions    Sertraline Rash    Other reaction(s): Confusion    Review of Systems NEGATIVE UNLESS OTHERWISE INDICATED IN HPI      Objective:     BP 124/76 (BP Location: Right Arm)   Pulse 80   Temp 98 F (36.7 C) (Temporal)   Ht 5\' 8"  (1.727 m)   Wt 158 lb 9.6 oz (71.9 kg)   SpO2 98%   BMI 24.12 kg/m   Wt Readings from Last 3 Encounters:  12/08/21 158 lb 9.6 oz (71.9 kg)  11/18/21 158 lb 9.6 oz (71.9 kg)  10/20/21 161 lb 6.4 oz (73.2 kg)    BP Readings from Last 3 Encounters:  12/08/21 124/76  11/18/21 112/66  10/20/21  124/78     Physical Exam Constitutional:      Appearance: Normal appearance.  Eyes:     Comments: Minor redness R conjunctiva   Cardiovascular:     Rate and Rhythm: Normal rate and regular rhythm.     Pulses: Normal pulses.  Pulmonary:     Effort: Pulmonary effort is normal.     Breath sounds: Normal breath sounds.  Skin:    Findings: No rash.  Neurological:     Mental Status: She is alert.  Psychiatric:        Mood and Affect: Mood normal.        Assessment & Plan:   Problem List Items Addressed This Visit       Nervous and Auditory   Cervical radiculopathy   Relevant Medications   gabapentin (NEURONTIN) 300 MG capsule     Other   Neck pain, chronic   Relevant Medications   gabapentin (NEURONTIN) 300 MG capsule   traMADol (ULTRAM) 50 MG tablet   Other Visit Diagnoses     Detached retina, right    -  Primary   History of fundoplication            Meds ordered this encounter  Medications   gabapentin (NEURONTIN) 300 MG capsule    Sig:  Take 1 capsule (300 mg total) by mouth at bedtime. Take for nerve pain.    Dispense:  90 capsule    Refill:  0    Order Specific Question:   Supervising Provider    Answer:   Shelva Majestic [[id number]]   pantoprazole (PROTONIX) 40 MG tablet    Sig: Take 1 tablet (40 mg total) by mouth daily.    Dispense:  90 tablet    Refill:  1    Order Specific Question:   Supervising Provider    Answer:   Durene Cal, STEPHEN O [[id number]]   traMADol (ULTRAM) 50 MG tablet    Sig: TAKE 1 TABLET BY MOUTH EVERY 6 (SIX) HOURS AS NEEDED FOR MODERATE PAIN (6-8 PER DAY DEPENDING ON PAIN).    Dispense:  90 tablet    Refill:  0    Not to exceed 5 additional fills before 04/18/2022    Order Specific Question:   Supervising Provider    Answer:   Shelva Majestic [[id number]]   1. Detached retina, right -Currently recovery from surgery, will f/up with eye surgeon on 22nd  2. Neck pain, chronic 3. Cervical radiculopathy -Will f/up with neurosurgeon soon (had to push back b/c of emergent eye surgery) -PDMP reviewed today, no red flags, filling appropriately.  -Refilled #90 of Tramadol and Gabapentin today  4. History of fundoplication Rx Protonix 40 mg #90 refilled today    Return if symptoms worsen or fail to improve.  This note was prepared with assistance of Conservation officer, historic buildings. Occasional wrong-word or sound-a-like substitutions may have occurred due to the inherent limitations of voice recognition software.   Alaria Oconnor M Mccade Sullenberger, PA-C

## 2021-12-08 NOTE — Patient Instructions (Signed)
Glad to see you today and best wishes on continued recovery. Medications refilled as requested. Call sooner if any concerns.

## 2021-12-22 ENCOUNTER — Ambulatory Visit (INDEPENDENT_AMBULATORY_CARE_PROVIDER_SITE_OTHER): Payer: Medicare Other | Admitting: Physician Assistant

## 2021-12-22 ENCOUNTER — Encounter: Payer: Self-pay | Admitting: Physician Assistant

## 2021-12-22 VITALS — BP 118/66 | HR 78 | Temp 97.8°F | Ht 68.0 in | Wt 159.0 lb

## 2021-12-22 DIAGNOSIS — R739 Hyperglycemia, unspecified: Secondary | ICD-10-CM

## 2021-12-22 LAB — POCT GLYCOSYLATED HEMOGLOBIN (HGB A1C): Hemoglobin A1C: 5.2 % (ref 4.0–5.6)

## 2021-12-22 NOTE — Patient Instructions (Signed)
Lab Results  Component Value Date   HGBA1C 5.2 12/22/2021   WAY under 6.5%. Not diabetic. Keep up good work with your health!

## 2021-12-22 NOTE — Progress Notes (Signed)
Subjective:    Patient ID: Tina Pena, female    DOB: 1956/06/11, 66 y.o.   MRN: [id number]  Chief Complaint  Patient presents with   blood test results     Elevated glucose     HPI Patient is in today for follow-up after elevated glucose reading. She was at her rheumatologist's office three days ago and had a non-fasting reading of 127. She was there for Remicade infusion. Wants to make sure not diabetic. No polyphagia, polydipsia, or polyuria.   Past Medical History:  Diagnosis Date   Acid reflux    Allergy    Arthritis    Barrett's esophagus    Bipolar disorder (HCC)    Cervical herniated disc    Chronic pain    Depression    Dermatomyositis (HCC)    H/O vitamin D deficiency    Hypercholesterolemia    Ocular migraine    Osteoporosis     Past Surgical History:  Procedure Laterality Date   APPENDECTOMY     GALLBLADDER SURGERY  2001   HERNIA REPAIR     TONSILLECTOMY  1981   toupetfundoplicatio  2000   TUBAL LIGATION      Family History  Problem Relation Age of Onset   Cancer Father        breast   Bipolar disorder Father    Anxiety disorder Father    Depression Father    Paranoid behavior Father    Heart disease Father    Stroke Maternal Grandmother    Hypertension Maternal Grandmother    Bipolar disorder Maternal Grandmother    Dementia Maternal Grandmother    Hypertension Maternal Grandfather    Stroke Paternal Grandmother    Hypertension Paternal Grandmother    Bipolar disorder Paternal Grandmother    Dementia Paternal Grandmother    Hypertension Paternal Grandfather     Social History   Tobacco Use   Smoking status: Never   Smokeless tobacco: Never  Vaping Use   Vaping Use: Never used  Substance Use Topics   Alcohol use: Not Currently    Alcohol/week: 2.0 standard drinks of alcohol    Types: 2 Glasses of wine per week    Comment: occasional glass of wine   Drug use: Never     Allergies  Allergen Reactions   Codeine Nausea And  Vomiting   Garlic Nausea And Vomiting   Other Nausea And Vomiting    Onions    Sertraline Rash    Other reaction(s): Confusion    Review of Systems NEGATIVE UNLESS OTHERWISE INDICATED IN HPI      Objective:     BP 118/66   Pulse 78   Temp 97.8 F (36.6 C) (Temporal)   Ht 5\' 8"  (1.727 m)   Wt 159 lb (72.1 kg)   SpO2 100%   BMI 24.18 kg/m   Wt Readings from Last 3 Encounters:  12/22/21 159 lb (72.1 kg)  12/08/21 158 lb 9.6 oz (71.9 kg)  11/18/21 158 lb 9.6 oz (71.9 kg)    BP Readings from Last 3 Encounters:  12/22/21 118/66  12/08/21 124/76  11/18/21 112/66     Physical Exam Constitutional:      Appearance: Normal appearance.  Cardiovascular:     Rate and Rhythm: Normal rate.  Pulmonary:     Breath sounds: Normal breath sounds.  Neurological:     General: No focal deficit present.     Mental Status: She is alert.  Psychiatric:  Mood and Affect: Mood normal.        Assessment & Plan:   Problem List Items Addressed This Visit   None Visit Diagnoses     Hyperglycemia    -  Primary   Relevant Orders   POCT HgB A1C (Completed)      1. Hyperglycemia Lab Results  Component Value Date   HGBA1C 5.2 12/22/2021   Reassured pt she is not diabetic. She will cont with good lifestyle habits.   This note was prepared with assistance of Conservation officer, historic buildings. Occasional wrong-word or sound-a-like substitutions may have occurred due to the inherent limitations of voice recognition software.    Natascha Edmonds M Arnika Larzelere, PA-C

## 2022-01-17 ENCOUNTER — Ambulatory Visit: Payer: Medicare Other | Admitting: Physician Assistant

## 2022-01-19 ENCOUNTER — Ambulatory Visit: Payer: Medicare Other

## 2022-01-20 ENCOUNTER — Ambulatory Visit (INDEPENDENT_AMBULATORY_CARE_PROVIDER_SITE_OTHER): Payer: Medicare Other | Admitting: Physician Assistant

## 2022-01-20 ENCOUNTER — Encounter: Payer: Self-pay | Admitting: Gastroenterology

## 2022-01-20 VITALS — BP 128/78 | HR 85 | Temp 98.0°F | Ht 68.0 in | Wt 160.2 lb

## 2022-01-20 DIAGNOSIS — M542 Cervicalgia: Secondary | ICD-10-CM

## 2022-01-20 DIAGNOSIS — M5412 Radiculopathy, cervical region: Secondary | ICD-10-CM | POA: Diagnosis not present

## 2022-01-20 DIAGNOSIS — K648 Other hemorrhoids: Secondary | ICD-10-CM

## 2022-01-20 DIAGNOSIS — G8929 Other chronic pain: Secondary | ICD-10-CM

## 2022-01-20 NOTE — Progress Notes (Signed)
Subjective:    Patient ID: Tina Pena, female    DOB: 12-Aug-1955, 66 y.o.   MRN: [id number]  Chief Complaint  Patient presents with   Follow-up    Pt has been unsuccessful to getting into GI and thinks may need to see proctologist, Hemorid are bad, bleeding and pain, and rash.     HPI Patient is in today for recheck neck pain. She did have appt with neurosurgeon Dr. Lyla Glassing - injections were recommended for arthritis and some disc issues, not surgical (yet). Exercise helps a lot per pt. Tramadol 50 mg 1/2 tablet here and there as needed, does not need refills. She also has concerns about hemorrhoids, had to have banding at one time, will see her GYN on Monday and discuss with them then.   Past Medical History:  Diagnosis Date   Acid reflux    Allergy    Arthritis    Barrett's esophagus    Bipolar disorder (HCC)    Cervical herniated disc    Chronic pain    Depression    Dermatomyositis (HCC)    H/O vitamin D deficiency    Hypercholesterolemia    Ocular migraine    Osteoporosis     Past Surgical History:  Procedure Laterality Date   APPENDECTOMY     GALLBLADDER SURGERY  2001   HERNIA REPAIR     TONSILLECTOMY  1981   toupetfundoplicatio  2000   TUBAL LIGATION      Family History  Problem Relation Age of Onset   Cancer Father        breast   Bipolar disorder Father    Anxiety disorder Father    Depression Father    Paranoid behavior Father    Heart disease Father    Stroke Maternal Grandmother    Hypertension Maternal Grandmother    Bipolar disorder Maternal Grandmother    Dementia Maternal Grandmother    Hypertension Maternal Grandfather    Stroke Paternal Grandmother    Hypertension Paternal Grandmother    Bipolar disorder Paternal Grandmother    Dementia Paternal Grandmother    Hypertension Paternal Grandfather     Social History   Tobacco Use   Smoking status: Never   Smokeless tobacco: Never  Vaping Use   Vaping Use: Never used   Substance Use Topics   Alcohol use: Not Currently    Alcohol/week: 2.0 standard drinks of alcohol    Types: 2 Glasses of wine per week    Comment: occasional glass of wine   Drug use: Never     Allergies  Allergen Reactions   Codeine Nausea And Vomiting   Garlic Nausea And Vomiting   Other Nausea And Vomiting    Onions    Sertraline Rash    Other reaction(s): Confusion    Review of Systems NEGATIVE UNLESS OTHERWISE INDICATED IN HPI      Objective:     BP 128/78 (BP Location: Right Arm)   Pulse 85   Temp 98 F (36.7 C) (Temporal)   Ht 5\' 8"  (1.727 m)   Wt 160 lb 3.2 oz (72.7 kg)   SpO2 98%   BMI 24.36 kg/m   Wt Readings from Last 3 Encounters:  01/20/22 160 lb 3.2 oz (72.7 kg)  12/22/21 159 lb (72.1 kg)  12/08/21 158 lb 9.6 oz (71.9 kg)    BP Readings from Last 3 Encounters:  01/20/22 128/78  12/22/21 118/66  12/08/21 124/76     Physical Exam Constitutional:  Appearance: Normal appearance.  Cardiovascular:     Rate and Rhythm: Normal rate and regular rhythm.  Pulmonary:     Effort: Pulmonary effort is normal.     Breath sounds: Normal breath sounds.  Neurological:     General: No focal deficit present.     Mental Status: She is alert.     Motor: No weakness.     Gait: Gait normal.  Psychiatric:        Mood and Affect: Mood normal.        Behavior: Behavior normal.        Assessment & Plan:   Problem List Items Addressed This Visit       Nervous and Auditory   Cervical radiculopathy     Other   Neck pain, chronic - Primary   Other Visit Diagnoses     Other hemorrhoids           1. Neck pain, chronic 2. Cervical radiculopathy Doing well, stable at this time, did meet with neurosurgeon, considering injections Takes Tramadol only as needed. PDMP reviewed today, no red flags, filling appropriately. Does not need refill at this time. Will call if any concerns or changes.   3. Other hemorrhoids Hx of banding procedure in office  awhile ago; prefers to have GYN look at her appt next week, also my CMA scheduled her for appt with GI in August.    Return in about 6 months (around 07/23/2022) for med recheck .    Bernisha Verma M Kairi Harshbarger, PA-C

## 2022-02-17 ENCOUNTER — Ambulatory Visit (INDEPENDENT_AMBULATORY_CARE_PROVIDER_SITE_OTHER): Payer: Medicare Other | Admitting: Gastroenterology

## 2022-02-17 ENCOUNTER — Telehealth: Payer: Self-pay | Admitting: Gastroenterology

## 2022-02-17 ENCOUNTER — Other Ambulatory Visit: Payer: Self-pay | Admitting: Physician Assistant

## 2022-02-17 ENCOUNTER — Encounter: Payer: Self-pay | Admitting: Gastroenterology

## 2022-02-17 VITALS — BP 118/82 | HR 88 | Ht 68.0 in | Wt 160.0 lb

## 2022-02-17 DIAGNOSIS — R1310 Dysphagia, unspecified: Secondary | ICD-10-CM

## 2022-02-17 DIAGNOSIS — K602 Anal fissure, unspecified: Secondary | ICD-10-CM | POA: Diagnosis not present

## 2022-02-17 DIAGNOSIS — K5909 Other constipation: Secondary | ICD-10-CM

## 2022-02-17 DIAGNOSIS — K219 Gastro-esophageal reflux disease without esophagitis: Secondary | ICD-10-CM | POA: Diagnosis not present

## 2022-02-17 MED ORDER — AMBULATORY NON FORMULARY MEDICATION: 1 refills | Status: DC

## 2022-02-17 NOTE — Telephone Encounter (Signed)
.   Encourage patient to contact the pharmacy for refills or they can request refills through [facility]  LAST APPOINTMENT DATE: 01/20/22  NEXT APPOINTMENT DATE: 07/24/22  MEDICATION: tramadol   Is the patient out of medication?   PH[facility]ACY: [facility] in [place]  Let patient know to contact pharmacy at the end of the day to make sure medication is ready.  Please notify patient to allow 48-72 hours to process  CLINICAL FILLS OUT ALL BELOW:   LAST REFILL:  QTY:  REFILL DATE:    OTHER COMMENTS:    Okay for refill?  Please advise

## 2022-02-17 NOTE — Progress Notes (Unsigned)
02/17/2022 Tina Pena [id number] 1956/06/12   HISTORY OF PRESENT ILLNESS:   Past Medical History:  Diagnosis Date   Acid reflux    Allergy    Anal fissure    Arthritis    Barrett's esophagus    Bipolar disorder (HCC)    Cervical herniated disc    Chronic headaches    Chronic pain    Depression    Dermatomyositis (HCC)    GERD (gastroesophageal reflux disease)    H/O vitamin D deficiency    Hypercholesterolemia    IBS (irritable bowel syndrome)    Ocular migraine    Osteoporosis    Past Surgical History:  Procedure Laterality Date   APPENDECTOMY     GALLBLADDER SURGERY  2001   HERNIA REPAIR     TONSILLECTOMY  1981   toupetfundoplicatio  2000   TUBAL LIGATION      reports that she has never smoked. She has never used smokeless tobacco. She reports that she does not currently use alcohol after a past usage of about 2.0 standard drinks of alcohol per week. She reports that she does not use drugs. family history includes Anxiety disorder in her father; Bipolar disorder in her father, maternal grandmother, and paternal grandmother; Cancer in her father; Dementia in her maternal grandmother and paternal grandmother; Depression in her father; Heart disease in her father; Hypertension in her maternal grandfather, maternal grandmother, paternal grandfather, and paternal grandmother; Paranoid behavior in her father; Stroke in her maternal grandmother and paternal grandmother. Allergies  Allergen Reactions   Codeine Nausea And Vomiting   Garlic Nausea And Vomiting   Other Nausea And Vomiting    Onions    Sertraline Rash    Other reaction(s): Confusion      Outpatient Encounter Medications as of 02/17/2022  Medication Sig   augmented betamethasone dipropionate (DIPROLENE-AF) 0.05 % ointment Apply topically 2 (two) times daily.   hydroxychloroquine (PLAQUENIL) 200 MG tablet Take 200 mg by mouth 2 (two) times daily. 1 tab by mouth in AM, 1 tab by mouth in PM    inFLIXimab (REMICADE) 100 MG injection 7mg /kg   Loratadine-Pseudoephedrine (CLARITIN-D 24 HOUR PO) Take by mouth. 1 tab by mouth in AM as needed   Multiple Vitamins-Minerals (MULTI ADULT GUMMIES PO) Take by mouth.   NON FORMULARY Ketorolac 0.5 1 drop right eye 3 times a day   pantoprazole (PROTONIX) 40 MG tablet Take 1 tablet (40 mg total) by mouth daily.   rizatriptan (MAXALT) 10 MG tablet    sucralfate (CARAFATE) 1 GM/10ML suspension Take 10 mLs (1 g total) by mouth 4 (four) times daily -  with meals and at bedtime.   traMADol (ULTRAM) 50 MG tablet TAKE 1 TABLET BY MOUTH EVERY 6 (SIX) HOURS AS NEEDED FOR MODERATE PAIN (6-8 PER DAY DEPENDING ON PAIN).   valACYclovir (VALTREX) 1000 MG tablet Take by mouth.   cephALEXin (KEFLEX) 500 MG capsule Take 500 mg by mouth 2 (two) times daily.   estrogens, conjugated, (PREMARIN) 0.625 MG tablet 1 tablet   gabapentin (NEURONTIN) 300 MG capsule Take 1 capsule (300 mg total) by mouth at bedtime. Take for nerve pain.   MOVANTIK 25 MG TABS tablet Take 25 mg by mouth daily.   No facility-administered encounter medications on file as of 02/17/2022.     REVIEW OF SYSTEMS  : All other systems reviewed and negative except where noted in the History of Present Illness.   PHYSICAL EXAM: BP 118/82   Pulse 88  Ht 5\' 8"  (1.727 m)   Wt 160 lb (72.6 kg)   BMI 24.33 kg/m  General: Well developed white female in no acute distress Head: Normocephalic and atraumatic Eyes:  sclerae anicteric,conjunctive pink. Ears: Normal auditory acuity Neck: Supple, no masses.  Lungs: Clear throughout to auscultation Heart: Regular rate and rhythm Abdomen: Soft, nontender, non distended. No masses or hepatomegaly noted. Normal bowel sounds Rectal:  Musculoskeletal: Symmetrical with no gross deformities  Skin: No lesions on visible extremities Extremities: No edema  Neurological: Alert oriented x 4, grossly nonfocal Psychological:  Alert and cooperative. Normal mood and  affect  ASSESSMENT AND PLAN:    CC:  Allwardt, , PA-C

## 2022-02-17 NOTE — Telephone Encounter (Signed)
Inbound call from patient stating she recently spoke with a provider to schedule for a EDG patient states she is unavailable for sept 11 but will be available September 18 instead. Please give patient a call back to further advise.  Thank you

## 2022-02-17 NOTE — Patient Instructions (Addendum)
Start Miralax 1 capful daily in 8 ounces of liquid, may increase to twice daily as needed.  We have sent a prescription for Diltiazem 2% gel to [facility] for you. Using your index finger, you should apply a small amount of medication inside the rectum up to your first knuckle/joint three times x 6-8 weeks.  [facility]'s information is below: Address: [address], [place], [place] [address]  [phone]  *Please DO NOT go directly from our office to pick up this medication! Give the pharmacy 1 day to process the prescription as this is compounded and takes time to make.  You have been scheduled for an endoscopy. Please follow written instructions given to you at your visit today. If you use inhalers (even only as needed), please bring them with you on the day of your procedure.  _______________________________________________________  If you are age 23 or older, your body mass index should be between 23-30. Your Body mass index is 24.33 kg/m. If this is out of the aforementioned range listed, please consider follow up with your Primary Care Provider.  If you are age 41 or younger, your body mass index should be between 19-25. Your Body mass index is 24.33 kg/m. If this is out of the aformentioned range listed, please consider follow up with your Primary Care Provider.   ________________________________________________________  The [facility] providers would like to encourage you to use [facility] to communicate with providers for non-urgent requests or questions.  Due to long hold times on the telephone, sending your provider a message by [facility] may be a faster and more efficient way to get a response.  Please allow 48 business hours for a response.  Please remember that this is for non-urgent requests.  _______________________________________________________

## 2022-02-19 ENCOUNTER — Other Ambulatory Visit: Payer: Self-pay | Admitting: Physician Assistant

## 2022-02-20 NOTE — Telephone Encounter (Signed)
Sent in pt call not this morning

## 2022-02-20 NOTE — Telephone Encounter (Signed)
Patient rescheduled for 9/19

## 2022-02-21 ENCOUNTER — Encounter: Payer: Self-pay | Admitting: Gastroenterology

## 2022-02-21 DIAGNOSIS — K219 Gastro-esophageal reflux disease without esophagitis: Secondary | ICD-10-CM | POA: Insufficient documentation

## 2022-02-21 DIAGNOSIS — K5909 Other constipation: Secondary | ICD-10-CM | POA: Insufficient documentation

## 2022-02-21 DIAGNOSIS — R1310 Dysphagia, unspecified: Secondary | ICD-10-CM | POA: Insufficient documentation

## 2022-02-21 DIAGNOSIS — K602 Anal fissure, unspecified: Secondary | ICD-10-CM | POA: Insufficient documentation

## 2022-02-21 NOTE — Progress Notes (Signed)
I agree with the assessment and plan as outlined by Ms. Zehr. 

## 2022-02-28 ENCOUNTER — Ambulatory Visit (INDEPENDENT_AMBULATORY_CARE_PROVIDER_SITE_OTHER): Payer: Medicare Other | Admitting: Family Medicine

## 2022-02-28 ENCOUNTER — Encounter: Payer: Self-pay | Admitting: Family Medicine

## 2022-02-28 VITALS — BP 120/78 | HR 72 | Temp 97.8°F | Ht 68.0 in | Wt 160.2 lb

## 2022-02-28 DIAGNOSIS — N390 Urinary tract infection, site not specified: Secondary | ICD-10-CM

## 2022-02-28 DIAGNOSIS — R300 Dysuria: Secondary | ICD-10-CM

## 2022-02-28 LAB — POCT URINALYSIS DIPSTICK
Bilirubin, UA: NEGATIVE
Blood, UA: NEGATIVE
Glucose, UA: NEGATIVE
Ketones, UA: POSITIVE
Nitrite, UA: NEGATIVE
Protein, UA: POSITIVE — AB
Spec Grav, UA: >=1.03 — AB (ref 1.010–1.025)
Urobilinogen, UA: 0.2 E.U./dL
pH, UA: 6 (ref 5.0–8.0)

## 2022-02-28 MED ORDER — CEPHALEXIN 250 MG PO CAPS: 250.0000 mg | ORAL_CAPSULE | Freq: Every day | ORAL | 1 refills | Status: DC

## 2022-02-28 MED ORDER — SULFAMETHOXAZOLE-TRIMETHOPRIM 800-160 MG PO TABS: 1.0000 | ORAL_TABLET | Freq: Two times a day (BID) | ORAL | 0 refills | Status: DC

## 2022-02-28 MED ORDER — FLUCONAZOLE 150 MG PO TABS: 150.0000 mg | ORAL_TABLET | Freq: Once | ORAL | 5 refills | Status: AC

## 2022-02-28 NOTE — Progress Notes (Signed)
Subjective:     Patient ID: Tina Pena, female    DOB: [birth year], 66 y.o.   MRN: [id number]  Chief Complaint  Patient presents with   Cystitis    Recurrent bladder infection x 6 weeks    Dysuria    Sx started again on Sunday, took old rx of cipro    HPI Freq UTI-urol in past-not since 2019. Had dilitation urethra in past.  Had u/s and cysto in past.   Had gone 1 yr w/o UTI.  Now, Getting freq past 6 wks.  Has been to [facility].  On keflex/cipro/bactrim.  Now, 2 days of dysuria again-had leftover cipro so started taking it. Pressure on bladder and now some pain and difficulty urinating.  Did cipro for 5 days-finished 9/1.   Did feel better but then 2 days later, coming back.   Can be after intercourse, but not always.   No vaginal complaints.  Was tx'd by gyn mid august for yeast.  No f/c/back pain/n/v/diarrhea.  Has chronic constipation-on miralax.  There are no preventive care reminders to display for this patient.   Past Medical History:  Diagnosis Date   Acid reflux    Allergy    Anal fissure    Arthritis    Barrett's esophagus    Bipolar disorder (HCC)    Cervical herniated disc    Chronic headaches    Chronic pain    Depression    Dermatomyositis (HCC)    GERD (gastroesophageal reflux disease)    H/O vitamin D deficiency    Hypercholesterolemia    IBS (irritable bowel syndrome)    Ocular migraine    Osteoporosis     Past Surgical History:  Procedure Laterality Date   APPENDECTOMY     GALLBLADDER SURGERY  2001   HERNIA REPAIR     RETINAL DETACHMENT SURGERY Right 11/29/2021   TONSILLECTOMY  1981   toupetfundoplicatio  2000   TUBAL LIGATION      Outpatient Medications Prior to Visit  Medication Sig Dispense Refill   AMBULATORY NON FORMULARY MEDICATION Medication Name: Diltiazem 2% w/ Lidocaine 5% - Using your index finger, apply a small amount of medication inside the rectum up to your first knuckle/joint three times daily x 6-8 weeks. 30 g 1   busPIRone  (BUSPAR) 10 MG tablet Take 10 mg by mouth 2 (two) times daily.     hydroxychloroquine (PLAQUENIL) 200 MG tablet Take 200 mg by mouth 2 (two) times daily. 1 tab by mouth in AM, 1 tab by mouth in PM     hydrOXYzine (VISTARIL) 25 MG capsule      inFLIXimab (REMICADE) 100 MG injection 7mg /kg     loratadine-pseudoephedrine (CLARITIN-D 24 HOUR) 10-240 MG 24 hr tablet 1 tablet as needed Orally Once a day for 30 day(s)     LORazepam (ATIVAN) 0.5 MG tablet Take 0.5 mg by mouth daily as needed.     Multiple Vitamins-Minerals (MULTI ADULT GUMMIES PO) Take by mouth.     NON FORMULARY Ketorolac 0.5 1 drop right eye 3 times a day     pantoprazole (PROTONIX) 40 MG tablet Take 1 tablet (40 mg total) by mouth daily. 90 tablet 1   prednisoLONE acetate (PRED FORTE) 1 % ophthalmic suspension PLEASE SEE ATTACHED FOR DETAILED DIRECTIONS     rizatriptan (MAXALT) 10 MG tablet      sucralfate (CARAFATE) 1 GM/10ML suspension Take 10 mLs (1 g total) by mouth 4 (four) times daily -  with meals and at  bedtime. 420 mL 0   traMADol (ULTRAM) 50 MG tablet TAKE 1 TABLET BY MOUTH EVERY 6 (SIX) HOURS AS NEEDED FOR MODERATE PAIN (6-8 PER DAY DEPENDING ON PAIN). 90 tablet 0   valACYclovir (VALTREX) 1000 MG tablet Take by mouth.     Vilazodone HCl (VIIBRYD) 40 MG TABS Take 40 mg by mouth daily.     gabapentin (NEURONTIN) 300 MG capsule Take 1 capsule (300 mg total) by mouth at bedtime. Take for nerve pain. 90 capsule 0   augmented betamethasone dipropionate (DIPROLENE-AF) 0.05 % ointment Apply topically 2 (two) times daily.     cephALEXin (KEFLEX) 500 MG capsule Take 500 mg by mouth 2 (two) times daily.     estrogens, conjugated, (PREMARIN) 0.625 MG tablet 1 tablet     Loratadine-Pseudoephedrine (CLARITIN-D 24 HOUR PO) Take by mouth. 1 tab by mouth in AM as needed     MOVANTIK 25 MG TABS tablet Take 25 mg by mouth daily.     No facility-administered medications prior to visit.    Allergies  Allergen Reactions   Codeine Nausea  And Vomiting    Other reaction(s): extreme stomach upset, GI Intolerance   Garlic Nausea And Vomiting   Other Nausea And Vomiting    Onions    Sertraline Rash    Other reaction(s): Confusion Other reaction(s): confusion, Other (See Comments) Other reaction(s): Confusion    Methotrexate     Other reaction(s): elevated LFT's Other reaction(s): elevated LFT's    ROS neg/noncontributory except as noted HPI/below      Objective:     BP 120/78   Pulse 72   Temp 97.8 F (36.6 C) (Temporal)   Ht 5\' 8"  (1.727 m)   Wt 160 lb 4 oz (72.7 kg)   SpO2 97%   BMI 24.37 kg/m  Wt Readings from Last 3 Encounters:  02/28/22 160 lb 4 oz (72.7 kg)  02/17/22 160 lb (72.6 kg)  01/20/22 160 lb 3.2 oz (72.7 kg)    Physical Exam   Gen: WDWN NAD HEENT: NCAT, conjunctiva not injected, sclera nonicteric NECK:  supple, no thyromegaly, no nodes, no carotid bruits CARDIAC: RRR, S1S2+, no murmur. LUNGS: CTAB. No wheezes ABDOMEN:  BS+, soft, mild tenderness suprapubically, No HSM, no masses. No CVAT EXT:  no edema MSK: no gross abnormalities.  NEURO: A&O x3.  CN II-XII intact.  PSYCH: normal mood. Good eye contact  Reviewed UC notes/UA's/cultures.   Results for orders placed or performed in visit on 02/28/22  POCT urinalysis dipstick  Result Value Ref Range   Color, UA YELLOW    Clarity, UA CLOUDY    Glucose, UA Negative Negative   Bilirubin, UA NEGATIVE    Ketones, UA POSITIVE    Spec Grav, UA >=1.030 (A) 1.010 - 1.025   Blood, UA NEG    pH, UA 6.0 5.0 - 8.0   Protein, UA Positive (A) Negative   Urobilinogen, UA 0.2 0.2 or 1.0 E.U./dL   Nitrite, UA NEG    Leukocytes, UA Moderate (2+) (A) Negative   Appearance     Odor          Assessment & Plan:   Problem List Items Addressed This Visit   None Visit Diagnoses     Dysuria    -  Primary   Relevant Orders   POCT urinalysis dipstick (Completed)   Urine Culture   Ambulatory referral to Urology   Frequent UTI        Relevant Medications   sulfamethoxazole-trimethoprim (  BACTRIM DS) 800-160 MG tablet   cephALEXin (KEFLEX) 250 MG capsule   fluconazole (DIFLUCAN) 150 MG tablet   Other Relevant Orders   Urine Culture   Ambulatory referral to Urology      UTI and getting freq-refer to urol.   Bactrim ds bid x 7 days, then keflex 250mg  daily for prevention Diflucan if getting yeast infection  Meds ordered this encounter  Medications   sulfamethoxazole-trimethoprim (BACTRIM DS) 800-160 MG tablet    Sig: Take 1 tablet by mouth 2 (two) times daily.    Dispense:  14 tablet    Refill:  0   cephALEXin (KEFLEX) 250 MG capsule    Sig: Take 1 capsule (250 mg total) by mouth daily in the afternoon.    Dispense:  30 capsule    Refill:  1   fluconazole (DIFLUCAN) 150 MG tablet    Sig: Take 1 tablet (150 mg total) by mouth once for 1 dose.    Dispense:  1 tablet    Refill:  5    , MD

## 2022-02-28 NOTE — Patient Instructions (Signed)
Referral placed for urologist When done with the sulfa, start the cephalexin daily for prevention.  Diflucan sent in if getting yeast

## 2022-03-02 ENCOUNTER — Other Ambulatory Visit: Payer: Self-pay | Admitting: Physician Assistant

## 2022-03-02 ENCOUNTER — Telehealth: Payer: Self-pay | Admitting: Physician Assistant

## 2022-03-02 LAB — URINE CULTURE
MICRO NUMBER:: 13872628
SPECIMEN QUALITY:: ADEQUATE

## 2022-03-02 NOTE — Telephone Encounter (Signed)
Please advise on refill if appropriate

## 2022-03-02 NOTE — Telephone Encounter (Signed)
Pt states she is feeling worse and would like to know what her lab results are from her urinalysis. Please advise

## 2022-03-02 NOTE — Telephone Encounter (Signed)
Called pt and advised Dr Myrtie Cruise recommendations; pt verbalized understanding and will keep [place] informed of how she is feeling.

## 2022-03-02 NOTE — Telephone Encounter (Signed)
Please advise 

## 2022-03-03 ENCOUNTER — Telehealth: Payer: Self-pay | Admitting: Physician Assistant

## 2022-03-03 ENCOUNTER — Encounter (HOSPITAL_BASED_OUTPATIENT_CLINIC_OR_DEPARTMENT_OTHER): Payer: Self-pay

## 2022-03-03 ENCOUNTER — Other Ambulatory Visit: Payer: Self-pay

## 2022-03-03 ENCOUNTER — Emergency Department (HOSPITAL_BASED_OUTPATIENT_CLINIC_OR_DEPARTMENT_OTHER)
Admission: EM | Admit: 2022-03-03 | Discharge: 2022-03-03 | Disposition: A | Discharge status: Home / Self Care | Payer: Medicare Other | Attending: Emergency Medicine | Admitting: Emergency Medicine

## 2022-03-03 DIAGNOSIS — Z20822 Contact with and (suspected) exposure to covid-19: Secondary | ICD-10-CM | POA: Insufficient documentation

## 2022-03-03 DIAGNOSIS — G43809 Other migraine, not intractable, without status migrainosus: Secondary | ICD-10-CM | POA: Diagnosis not present

## 2022-03-03 DIAGNOSIS — R519 Headache, unspecified: Secondary | ICD-10-CM | POA: Diagnosis present

## 2022-03-03 DIAGNOSIS — Z79899 Other long term (current) drug therapy: Secondary | ICD-10-CM | POA: Diagnosis not present

## 2022-03-03 LAB — CBC WITH DIFFERENTIAL/PLATELET
Abs Immature Granulocytes: 0.01 10*3/uL (ref 0.00–0.07)
Basophils Absolute: 0.1 10*3/uL (ref 0.0–0.1)
Basophils Relative: 1 %
Eosinophils Absolute: 0.2 10*3/uL (ref 0.0–0.5)
Eosinophils Relative: 3 %
HCT: 41.4 % (ref 36.0–46.0)
Hemoglobin: 13.5 g/dL (ref 12.0–15.0)
Immature Granulocytes: 0 %
Lymphocytes Relative: 12 %
Lymphs Abs: 0.6 10*3/uL — ABNORMAL LOW (ref 0.7–4.0)
MCH: 29.4 pg (ref 26.0–34.0)
MCHC: 32.6 g/dL (ref 30.0–36.0)
MCV: 90.2 fL (ref 80.0–100.0)
Monocytes Absolute: 0.2 10*3/uL (ref 0.1–1.0)
Monocytes Relative: 4 %
Neutro Abs: 4.1 10*3/uL (ref 1.7–7.7)
Neutrophils Relative %: 80 %
Platelets: 262 10*3/uL (ref 150–400)
RBC: 4.59 MIL/uL (ref 3.87–5.11)
RDW: 12.1 % (ref 11.5–15.5)
WBC: 5.1 10*3/uL (ref 4.0–10.5)
nRBC: 0 % (ref 0.0–0.2)

## 2022-03-03 LAB — BASIC METABOLIC PANEL
Anion gap: 7 (ref 5–15)
BUN: 15 mg/dL (ref 8–23)
CO2: 27 mmol/L (ref 22–32)
Calcium: 9 mg/dL (ref 8.9–10.3)
Chloride: 102 mmol/L (ref 98–111)
Creatinine, Ser: 0.75 mg/dL (ref 0.44–1.00)
GFR, Estimated: >60 mL/min (ref >60)
Glucose, Bld: 105 mg/dL — ABNORMAL HIGH (ref 70–99)
Potassium: 4.5 mmol/L (ref 3.5–5.1)
Sodium: 136 mmol/L (ref 135–145)

## 2022-03-03 LAB — SARS CORONAVIRUS 2 BY RT PCR: SARS Coronavirus 2 by RT PCR: NEGATIVE

## 2022-03-03 MED ORDER — METOCLOPRAMIDE HCL 5 MG/ML IJ SOLN
10.0000 mg | Freq: Once | INTRAMUSCULAR | Status: AC
  Administered 2022-03-03: 10 mg via INTRAVENOUS
  Filled 2022-03-03: qty 2

## 2022-03-03 MED ORDER — KETOROLAC TROMETHAMINE 30 MG/ML IJ SOLN
30.0000 mg | Freq: Once | INTRAMUSCULAR | Status: AC
  Administered 2022-03-03: 30 mg via INTRAVENOUS
  Filled 2022-03-03: qty 1

## 2022-03-03 MED ORDER — DEXAMETHASONE SODIUM PHOSPHATE 10 MG/ML IJ SOLN
10.0000 mg | Freq: Once | INTRAMUSCULAR | Status: AC
  Administered 2022-03-03: 10 mg via INTRAVENOUS
  Filled 2022-03-03: qty 1

## 2022-03-03 MED ORDER — SODIUM CHLORIDE 0.9 % IV BOLUS
1000.0000 mL | Freq: Once | INTRAVENOUS | Status: AC
  Administered 2022-03-03: 1000 mL via INTRAVENOUS

## 2022-03-03 MED ORDER — DIPHENHYDRAMINE HCL 50 MG/ML IJ SOLN
12.5000 mg | Freq: Once | INTRAMUSCULAR | Status: AC
  Administered 2022-03-03: 12.5 mg via INTRAVENOUS
  Filled 2022-03-03: qty 1

## 2022-03-03 MED ORDER — METOCLOPRAMIDE HCL 10 MG PO TABS: 10.0000 mg | ORAL_TABLET | Freq: Three times a day (TID) | ORAL | 0 refills | Status: DC | PRN

## 2022-03-03 NOTE — ED Provider Notes (Signed)
[facility] EMERGENCY DEPARTMENT Provider Note   CSN: [id number] Arrival date & time: 03/03/22  1213     History  Chief Complaint  Patient presents with   Headache   Emesis    Tina Pena is a 66 y.o. female presented to ED with headache and nausea.  The patient suffers of chronic migraines and reports that she had a migraine headache earlier today that she woke up with, which was frontal and now located behind her left eye, which is typical for her migraines.  She also has chronic neck pain but feels it is worse today.  She called her PCPs office and advised to come to the ED.  She reports he has some photophobia.  She says is all typical for her migraines but she has not had a migraine in many months.  She takes a triptan as an abortive medicine but it did not help her today.  She denies fevers or chills.  She reports she was started on Keflex for possible UTI yesterday.  She has taken Keflex before without adverse reaction  HPI     Home Medications Prior to Admission medications   Medication Sig Start Date End Date Taking? Authorizing Provider  metoCLOPramide (REGLAN) 10 MG tablet Take 1 tablet (10 mg total) by mouth every 8 (eight) hours as needed for up to 20 doses for nausea. 03/03/22  Yes Raquan Iannone, Kermit Balo, MD  AMBULATORY NON FORMULARY MEDICATION Medication Name: Diltiazem 2% w/ Lidocaine 5% - Using your index finger, apply a small amount of medication inside the rectum up to your first knuckle/joint three times daily x 6-8 weeks. 02/17/22   Zehr, Princella Pellegrini, PA-C  busPIRone (BUSPAR) 10 MG tablet Take 10 mg by mouth 2 (two) times daily. 02/21/22   [provider]  cephALEXin (KEFLEX) 250 MG capsule Take 1 capsule (250 mg total) by mouth daily in the afternoon. 02/28/22   Jeani Sow, MD  gabapentin (NEURONTIN) 300 MG capsule Take 1 capsule (300 mg total) by mouth at bedtime. Take for nerve pain. 12/08/21 01/07/22  Allwardt, Crist Infante, PA-C  hydroxychloroquine  (PLAQUENIL) 200 MG tablet Take 200 mg by mouth 2 (two) times daily. 1 tab by mouth in AM, 1 tab by mouth in PM    [provider]  hydrOXYzine (VISTARIL) 25 MG capsule     [provider]  inFLIXimab (REMICADE) 100 MG injection 7mg /kg    [provider]  loratadine-pseudoephedrine (CLARITIN-D 24 HOUR) 10-240 MG 24 hr tablet 1 tablet as needed Orally Once a day for 30 day(s)    [provider]  LORazepam (ATIVAN) 0.5 MG tablet Take 0.5 mg by mouth daily as needed. 01/10/22   [provider]  Multiple Vitamins-Minerals (MULTI ADULT GUMMIES PO) Take by mouth.    [provider]  NON FORMULARY Ketorolac 0.5 1 drop right eye 3 times a day    [provider]  pantoprazole (PROTONIX) 40 MG tablet Take 1 tablet (40 mg total) by mouth daily. 12/08/21 03/08/22  Allwardt, Alyssa M, PA-C  prednisoLONE acetate (PRED FORTE) 1 % ophthalmic suspension PLEASE SEE ATTACHED FOR DETAILED DIRECTIONS    [provider]  rizatriptan (MAXALT) 10 MG tablet  01/17/21   [provider]  sucralfate (CARAFATE) 1 GM/10ML suspension TAKE 10 MLS (1 G TOTAL) BY MOUTH 4 (FOUR) TIMES DAILY - WITH MEALS AND AT BEDTIME. 03/02/22   Allwardt, Alyssa M, PA-C  sulfamethoxazole-trimethoprim (BACTRIM DS) 800-160 MG tablet Take 1 tablet by  mouth 2 (two) times daily. 02/28/22   Tawnya Crook, MD  traMADol (ULTRAM) 50 MG tablet TAKE 1 TABLET BY MOUTH EVERY 6 (SIX) HOURS AS NEEDED FOR MODERATE PAIN (6-8 PER DAY DEPENDING ON PAIN). 02/20/22   Allwardt, Randa Evens, PA-C  valACYclovir (VALTREX) 1000 MG tablet Take by mouth. 10/17/21   [provider]  Vilazodone HCl (VIIBRYD) 40 MG TABS Take 40 mg by mouth daily. 02/06/22   [provider]      Allergies    Codeine, Garlic, Other, Sertraline, and Methotrexate    Review of Systems   Review of Systems  Physical Exam Updated Vital Signs BP 133/74   Pulse 73   Temp 97.7 F (36.5 C)   Resp 16   Ht 5\' 8"   (1.727 m)   Wt 71.7 kg   SpO2 98%   BMI 24.02 kg/m  Physical Exam Constitutional:      General: She is not in acute distress. HENT:     Head: Normocephalic and atraumatic.  Eyes:     Conjunctiva/sclera: Conjunctivae normal.     Pupils: Pupils are equal, round, and reactive to light.  Neck:     Meningeal: Kernig's sign absent.  Cardiovascular:     Rate and Rhythm: Normal rate and regular rhythm.  Pulmonary:     Effort: Pulmonary effort is normal. No respiratory distress.  Abdominal:     General: There is no distension.     Tenderness: There is no abdominal tenderness.  Musculoskeletal:     Cervical back: No rigidity.  Lymphadenopathy:     Cervical: No cervical adenopathy.  Skin:    General: Skin is warm and dry.  Neurological:     General: No focal deficit present.     Mental Status: She is alert. Mental status is at baseline.  Psychiatric:        Mood and Affect: Mood normal.        Behavior: Behavior normal.     ED Results / Procedures / Treatments   Labs (all labs ordered are listed, but only abnormal results are displayed) Labs Reviewed  BASIC METABOLIC PANEL - Abnormal; Notable for the following components:      Result Value   Glucose, Bld 105 (*)    All other components within normal limits  CBC WITH DIFFERENTIAL/PLATELET - Abnormal; Notable for the following components:   Lymphs Abs 0.6 (*)    All other components within normal limits  SARS CORONAVIRUS 2 BY RT PCR    EKG None  Radiology No results found.  Procedures Procedures    Medications Ordered in ED Medications  sodium chloride 0.9 % bolus 1,000 mL (0 mLs Intravenous Stopped 03/03/22 1602)  dexamethasone (DECADRON) injection 10 mg (10 mg Intravenous Given 03/03/22 1433)  metoCLOPramide (REGLAN) injection 10 mg (10 mg Intravenous Given 03/03/22 1434)  diphenhydrAMINE (BENADRYL) injection 12.5 mg (12.5 mg Intravenous Given 03/03/22 1435)  ketorolac (TORADOL) 30 MG/ML injection 30 mg (30 mg  Intravenous Given 03/03/22 1435)    ED Course/ Medical Decision Making/ A&P Clinical Course as of 03/03/22 1656  Fri Mar 03, 2022  1517 Patient's labs are unremarkable and she is having improvement of her headache.  If she continues to feel improved I anticipate discharge home.  I prescribed her some Reglan for the next 2 days, which she can take with ibuprofen at home. [MT]    Clinical Course User Index [MT] Matylda Fehring, Carola Rhine, MD  Medical Decision Making Amount and/or Complexity of Data Reviewed Labs: ordered.  Risk Prescription drug management.   This patient presents to the Emergency Department with complaint of headache.  This involves an extensive number of treatment options, and is a complaint that carries with it a high risk of complications and morbidity.  The differential diagnosis for headache includes tension type headache vs occipital headache vs migraine vs sinusitis vs other  She is emphatic that the pattern and presentation of her headache and migraine is very typical of her migraines, located behind the left eye.  I think is reasonable to treat this as a recurrence of migraine.  She does not demonstrate nuchal rigidity, fever, or leukocytosis to suggest that this is meningitis.  Low suspicion for intracranial bleed.  I do not believe she needs emergent lumbar puncture.  She has no personal or family history of brain aneurysm and reports she had negative neuroimaging of the brain in the past with her neurologist for her chronic headaches.   I ordered, reviewed, and interpreted labs, including BMP and CBC.  There were no immediate, life-threatening emergencies found in this labwork.   I ordered medication for headache and/or nausea  I do not see an emergent indication for neuroimaging at this time.  Would like to try some IV medications to see if she has improvement symptomatically.  She is otherwise well appearing on exam.          Final  Clinical Impression(s) / ED Diagnoses Final diagnoses:  Other migraine without status migrainosus, not intractable    Rx / DC Orders ED Discharge Orders          Ordered    metoCLOPramide (REGLAN) 10 MG tablet  Every 8 hours PRN        03/03/22 1518              Terald Sleeper, MD 03/03/22 1657

## 2022-03-03 NOTE — ED Triage Notes (Signed)
Patient states she woke up this AM with a headache an nausea. Took her migraine medication with no relief. Photophobia and vomiting reported.

## 2022-03-03 NOTE — Telephone Encounter (Signed)
Please see pt msg awaiting triage note, spoke w/ pt yesterday afternoon; pt advised would keep [place] updated on how she was feeling, pt is currently at ED

## 2022-03-03 NOTE — Telephone Encounter (Signed)
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 Caller states she started a new medication for a staph infection. This morning she woke up vomiting, abdominal pain. and a headache. She does have a history of migraines and took her migraine medication but it did not help. She is also sweating a lot. She has urinated in the last 8 hours. No blood in her vomit. Translation No Nurse Assessment Nurse: Jetty Peeks, RN, Lillia Abed Date/Time (Eastern Time): 03/03/2022 11:33:23 AM Confirm and document reason for call. If symptomatic, describe symptoms. ---Caller states she is having vomiting, abd pain, and headache. Sx started this morning. Taking abx keflex for UTI- started abx yesterday. No fever. States head is hurting the most right now. Does the patient have any new or worsening symptoms? ---Yes Will a triage be completed? ---Yes Related visit to physician within the last 2 weeks? ---Yes Does the PT have any chronic conditions? (i.e. diabetes, asthma, this includes High risk factors for pregnancy, etc.) ---Yes List chronic conditions. ---RA, osteoarthritis, auto immune disease Is this a behavioral health or substance abuse call? ---No Guidelines Guideline Title Affirmed Question Affirmed Notes Nurse Date/Time [name] Time) Neck Pain or Stiffness Difficulty breathing or unusual  sweating Weiss-Hilton, RN, Lillia Abed 03/03/2022 11:38:21 AM  Guidelines Guideline Title Affirmed Question Affirmed Notes Nurse Date/Time [name] Time) (e.g., sweating without exertion) Disp. Time [name] Time) Disposition Final User 03/03/2022 11:40:22 AM Go to ED Now Yes Weiss-Hilton, RN, Lillia Abed Final Disposition 03/03/2022 11:40:22 AM Go to ED Now Yes Weiss-Hilton, RN, Augustina Mood Disagree/Comply Comply Caller Understands Yes PreDisposition InappropriateToAsk Care Advice Given Per Guideline GO TO ED NOW: * You need to be seen in the Emergency Department. * Go to the ED at ___________ Hospital. * Leave now. Drive carefully. NOTE TO TRIAGER - DRIVING: * Another adult should drive. * Patient should not delay going to the emergency department. CALL EMS IF: * Severe difficulty breathing * Passes out or becomes too weak to stand * You become worse CARE ADVICE given per Neck Pain (Adult) guideline. Comments User: Jovita Kussmaul, RN Date/Time [name] Time): 03/03/2022 11:35:51 AM Hx of migraines User: Jovita Kussmaul, RN Date/Time [name] Time): 03/03/2022 11:37:43 AM also c/o neck pain Referrals [facility] - ED

## 2022-03-03 NOTE — Discharge Instructions (Addendum)
I prescribed a few tablets of Reglan which is a medicine he can use for her headaches with nausea.  This is for only severe cases.  You can take this combined with 600 mg of ibuprofen.  At bedtime 25 mg of Benadryl can also help with these medicines to help you sleep.  If your headache is getting more severe, if you have confusion, difficulty seeing, severe nausea, persistent vomiting, any strokelike symptoms, or other emergency concerns, please return to the ER.  Your blood work and your COVID test were normal today.  Your COVID test is negative

## 2022-03-03 NOTE — Telephone Encounter (Signed)
Pt states: -started new medication for infection, generic keflex on 03/02/22 -woke up with upset stomach, stomach pain and a bad headache. -she thought it was a migraine, and took her medication for that. -- medication has had no effect.  Pt warm transferred to PCP triage nurse line. Awaiting follow up notes

## 2022-03-06 ENCOUNTER — Encounter: Payer: Medicare Other | Admitting: Internal Medicine

## 2022-03-06 NOTE — Telephone Encounter (Signed)
Triage note 

## 2022-03-13 ENCOUNTER — Ambulatory Visit (INDEPENDENT_AMBULATORY_CARE_PROVIDER_SITE_OTHER): Payer: Medicare Other | Admitting: Psychiatry

## 2022-03-13 ENCOUNTER — Other Ambulatory Visit: Payer: Self-pay | Admitting: Psychiatry

## 2022-03-13 VITALS — BP 143/69 | HR 88 | Ht 68.0 in | Wt 163.1 lb

## 2022-03-13 DIAGNOSIS — M542 Cervicalgia: Secondary | ICD-10-CM

## 2022-03-13 MED ORDER — PROPRANOLOL HCL 20 MG PO TABS: 20.0000 mg | ORAL_TABLET | Freq: Two times a day (BID) | ORAL | 6 refills | Status: DC

## 2022-03-13 MED ORDER — NURTEC 75 MG PO TBDP: 75.0000 mg | ORAL_TABLET | ORAL | 6 refills | Status: DC | PRN

## 2022-03-13 NOTE — Patient Instructions (Signed)
Plan:  Start propranolol 20 mg twice a day for headache prevention. Side effects may include lightheadedness or shortness of breath when exercising  Start Nurtec 75 mg as needed for migraine rescue. Take one pill at onset of migraine. Max dose 1 pill in 24 hours

## 2022-03-13 NOTE — Progress Notes (Signed)
   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 Tina Pena 03/2021 was reportedly normal per patient.  At her last visit, Nurtec samples were provided. She was recommended to start magnesium for migraine prevention.  Interval History: Headaches have worsened since her last visit. She is currently getting headaches at least once per week, sometimes will get multiple per week. Had a bad migraine last week and had to go to the emergency room for a migraine cocktail.  She has been taking Maxalt as needed which has not helped much. Nurtec sample did work well for her.   She continues to have neck pain. Does stretches at home which help somewhat, but is interested in trying physical therapy.   Headache days per month: 5 Headache free days per month: 25  Current Headache Regimen: Preventative: none Abortive: Maxalt 10 mg PRN   Prior Therapies                                  Imitrex Maxalt 10 mg PRN - lack of efficacy Zomig (pill and dissolvable tablet) Nurtec - helped Reglan Gabapentin 300 mg QHS - side effects Topamax - side effects  Vilazodone 40 mg daily  Physical Exam:   Vital Signs: BP (!) 143/69   Pulse 88   Ht 5\' 8"  (1.727 m)   Wt 163 lb 2 oz (74 kg)   BMI 24.80 kg/m  GENERAL:  well appearing, in no acute distress, alert  SKIN:  Color, texture, turgor normal. No rashes or lesions HEAD:  Normocephalic/atraumatic. RESP: normal respiratory effort MSK:  No gross joint deformities.   NEUROLOGICAL: Mental Status: Alert, oriented to person, place and time, Follows commands, and Speech fluent and appropriate. Cranial Nerves: PERRL, face symmetric, no dysarthria, hearing grossly intact Motor: moves all extremities equally Gait: normal-based.  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 Headache  frequency has increased since her last visit and she would like to start a preventive medication. Will start propranolol for migraine prevention. Will send in Nurtec rx for rescue as the sample was effective for her. Referral to neck PT sent for cervicalgia.  PLAN: -Preventive: Start propranolol 20 mg BID -Rescue: Start Nurtec 75 mg PRN -Referral to neck PT -Next steps: consider CGRP, Botox   Follow-up: 3 months  I spent a total of 19 minutes on the date of the service. Headache education was done. Discussed treatment options including preventive and acute medications, and physical therapy.  Discussed medication side effects, adverse reactions and drug interactions. Written educational materials and patient instructions outlining all of the above were given.  Genia Harold, MD 03/13/22 1:43 PM

## 2022-03-14 ENCOUNTER — Ambulatory Visit (AMBULATORY_SURGERY_CENTER): Payer: Medicare Other | Admitting: Internal Medicine

## 2022-03-14 ENCOUNTER — Encounter: Payer: Self-pay | Admitting: Internal Medicine

## 2022-03-14 VITALS — BP 131/68 | HR 77 | Temp 97.3°F | Resp 13 | Ht 68.0 in | Wt 160.0 lb

## 2022-03-14 DIAGNOSIS — R1310 Dysphagia, unspecified: Secondary | ICD-10-CM

## 2022-03-14 DIAGNOSIS — K319 Disease of stomach and duodenum, unspecified: Secondary | ICD-10-CM | POA: Diagnosis not present

## 2022-03-14 DIAGNOSIS — R12 Heartburn: Secondary | ICD-10-CM | POA: Diagnosis not present

## 2022-03-14 DIAGNOSIS — K2990 Gastroduodenitis, unspecified, without bleeding: Secondary | ICD-10-CM | POA: Diagnosis not present

## 2022-03-14 DIAGNOSIS — K2970 Gastritis, unspecified, without bleeding: Secondary | ICD-10-CM | POA: Diagnosis not present

## 2022-03-14 DIAGNOSIS — K219 Gastro-esophageal reflux disease without esophagitis: Secondary | ICD-10-CM

## 2022-03-14 MED ORDER — PANTOPRAZOLE SODIUM 40 MG PO TBEC: DELAYED_RELEASE_TABLET | ORAL | 1 refills | Status: DC

## 2022-03-14 MED ORDER — SODIUM CHLORIDE 0.9 % IV SOLN: 500.0000 mL | Freq: Once | INTRAVENOUS | Status: DC

## 2022-03-14 NOTE — Op Note (Signed)
[facility] Patient Name: Tina Pena Procedure Date: 03/14/2022 11:20 AM MRN: [id number] Endoscopist: Nicole Kindred "Eulah Pont ,  Age: 66 Referring MD:  Date of Birth: [birth year] Gender: Female Account #: [id number] Procedure:                Upper GI endoscopy Indications:              Dysphagia, Heartburn Medicines:                Monitored Anesthesia Care Procedure:                Pre-Anesthesia Assessment:                           - Prior to the procedure, a History and Physical                            was performed, and patient medications and                            allergies were reviewed. The patient's tolerance of                            previous anesthesia was also reviewed. The risks                            and benefits of the procedure and the sedation                            options and risks were discussed with the patient.                            All questions were answered, and informed consent                            was obtained. Prior Anticoagulants: The patient has                            taken no previous anticoagulant or antiplatelet                            agents. ASA Grade Assessment: II - A patient with                            mild systemic disease. After reviewing the risks                            and benefits, the patient was deemed in                            satisfactory condition to undergo the procedure.                           After obtaining informed consent, the endoscope was  passed under direct vision. Throughout the                            procedure, the patient's blood pressure, pulse, and                            oxygen saturations were monitored continuously. The                            GIF [id number] #[id number] was introduced through the                            mouth, and advanced to the second part of duodenum.                            The upper GI endoscopy was  accomplished without                            difficulty. The patient tolerated the procedure                            well. Scope In: Scope Out: Findings:                 Localized mucosal variance characterized by                            erythema and altered texture was found in the                            proximal esophagus (likely an inlet patch).                            Biopsies were taken with a cold forceps for                            histology.                           Salmon-colored mucosa was present. The maximum                            longitudinal extent of these esophageal mucosal                            changes was 1 cm in length. Biopsies were taken                            with a cold forceps for histology.                           Evidence of a Toupet fundoplication was found in                            the cardia. The wrap appeared loose. This was  traversed.                           Localized moderate inflammation characterized by                            congestion (edema), erosions and erythema with                            stigmata of recent bleeding was found in the                            gastric antrum. Biopsies were taken with a cold                            forceps for histology.                           The examined duodenum was normal. Complications:            No immediate complications. Estimated Blood Loss:     Estimated blood loss was minimal. Impression:               - Esophageal mucosal variant. Biopsied.                           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 Biopsied.                           - Gastritis. Biopsied.                           - Normal examined duodenum. Recommendation:           - Discharge patient to home (with escort).                           - Await pathology results.                            - Use Protonix (pantoprazole) 40 mg PO BID for 8                            weeks.                           - Return to GI clinic in 6 weeks.                           - The findings and recommendations were discussed                            with the patient. Dr Georgian Co "Lyndee Leo" Lorenso Courier,  03/14/2022 11:44:48 AM

## 2022-03-14 NOTE — Progress Notes (Signed)
Pt's states no medical or surgical changes since previsit or office visit. 

## 2022-03-14 NOTE — Progress Notes (Signed)
To pacu, VSS. Report to [email or web address].tb 

## 2022-03-14 NOTE — Progress Notes (Signed)
GASTROENTEROLOGY PROCEDURE H&P NOTE   Primary Care Physician: Allwardt, Randa Evens, PA-C    Reason for Procedure:   Dysphagia, history of Barrett's esophagus  Plan:    EGD  Patient is appropriate for endoscopic procedure(s) in the ambulatory ([place]) setting.  The nature of the procedure, as well as the risks, benefits, and alternatives were carefully and thoroughly reviewed with the patient. Ample time for discussion and questions allowed. The patient understood, was satisfied, and agreed to proceed.     HPI: Tina Pena is a 66 y.o. female who presents for EGD for evaluation of dysphagia and history of Barrett's esophagus .  Patient was most recently seen in the Gastroenterology Clinic on 02/17/22.  No interval change in medical history since that appointment. Please refer to that note for full details regarding GI history and clinical presentation.   Past Medical History:  Diagnosis Date   Acid reflux    Allergy    Anal fissure    Arthritis    Barrett's esophagus    Bipolar disorder (HCC)    Cervical herniated disc    Chronic headaches    Chronic pain    Depression    Dermatomyositis (HCC)    GERD (gastroesophageal reflux disease)    H/O vitamin D deficiency    Hypercholesterolemia    IBS (irritable bowel syndrome)    Ocular migraine    Osteoporosis     Past Surgical History:  Procedure Laterality Date   APPENDECTOMY     GALLBLADDER SURGERY  2001   HERNIA REPAIR     RETINAL DETACHMENT SURGERY Right 11/29/2021   TONSILLECTOMY  5643   toupetfundoplicatio  3295   TUBAL LIGATION      Prior to Admission medications   Medication Sig Start Date End Date Taking? Authorizing Provider  busPIRone (BUSPAR) 10 MG tablet Take 10 mg by mouth 2 (two) times daily. 02/21/22  Yes [provider]  cephALEXin (KEFLEX) 250 MG capsule Take 1 capsule (250 mg total) by mouth daily in the afternoon. 02/28/22  Yes Tawnya Crook, MD  hydroxychloroquine (PLAQUENIL) 200 MG  tablet Take 200 mg by mouth 2 (two) times daily. 1 tab by mouth in AM, 1 tab by mouth in PM   Yes [provider]  loratadine-pseudoephedrine (CLARITIN-D 24 HOUR) 10-240 MG 24 hr tablet 1 tablet as needed Orally Once a day for 30 day(s)   Yes [provider]  Multiple Vitamins-Minerals (MULTI ADULT GUMMIES PO) Take by mouth.   Yes [provider]  prednisoLONE acetate (PRED FORTE) 1 % ophthalmic suspension PLEASE SEE ATTACHED FOR DETAILED DIRECTIONS   Yes [provider]  sucralfate (CARAFATE) 1 GM/10ML suspension TAKE 10 MLS (1 G TOTAL) BY MOUTH 4 (FOUR) TIMES DAILY - WITH MEALS AND AT BEDTIME. 03/02/22  Yes Allwardt, Alyssa M, PA-C  traMADol (ULTRAM) 50 MG tablet TAKE 1 TABLET BY MOUTH EVERY 6 (SIX) HOURS AS NEEDED FOR MODERATE PAIN (6-8 PER DAY DEPENDING ON PAIN). 02/20/22  Yes Allwardt, Alyssa M, PA-C  Vilazodone HCl (VIIBRYD) 40 MG TABS Take 40 mg by mouth daily. 02/06/22  Yes [provider]  AMBULATORY NON FORMULARY MEDICATION Medication Name: Diltiazem 2% w/ Lidocaine 5% - Using your index finger, apply a small amount of medication inside the rectum up to your first knuckle/joint three times daily x 6-8 weeks. 02/17/22   Zehr, Laban Emperor, PA-C  hydrOXYzine (VISTARIL) 25 MG capsule     [provider]  inFLIXimab (REMICADE) 100 MG injection 7mg /kg  [provider]  LORazepam (ATIVAN) 0.5 MG tablet Take 0.5 mg by mouth daily as needed. 01/10/22   [provider]  metoCLOPramide (REGLAN) 10 MG tablet Take 1 tablet (10 mg total) by mouth every 8 (eight) hours as needed for up to 20 doses for nausea. 03/03/22   Wyvonnia Dusky, MD  NON FORMULARY Ketorolac 0.5 1 drop right eye 3 times a day    [provider]  pantoprazole (PROTONIX) 40 MG tablet Take 1 tablet (40 mg total) by mouth daily. 12/08/21 03/08/22  Allwardt, Randa Evens, PA-C  propranolol (INDERAL) 20 MG tablet Take 1 tablet (20 mg total) by mouth 2 (two) times  daily. Patient not taking: Reported on 03/14/2022 03/13/22   Genia Harold, MD  Rimegepant Sulfate (NURTEC) 75 MG TBDP Take 75 mg by mouth as needed. Patient not taking: Reported on 03/14/2022 03/13/22   Genia Harold, MD  valACYclovir (VALTREX) 1000 MG tablet Take by mouth. 10/17/21   [provider]    Current Outpatient Medications  Medication Sig Dispense Refill   busPIRone (BUSPAR) 10 MG tablet Take 10 mg by mouth 2 (two) times daily.     cephALEXin (KEFLEX) 250 MG capsule Take 1 capsule (250 mg total) by mouth daily in the afternoon. 30 capsule 1   hydroxychloroquine (PLAQUENIL) 200 MG tablet Take 200 mg by mouth 2 (two) times daily. 1 tab by mouth in AM, 1 tab by mouth in PM     loratadine-pseudoephedrine (CLARITIN-D 24 HOUR) 10-240 MG 24 hr tablet 1 tablet as needed Orally Once a day for 30 day(s)     Multiple Vitamins-Minerals (MULTI ADULT GUMMIES PO) Take by mouth.     prednisoLONE acetate (PRED FORTE) 1 % ophthalmic suspension PLEASE SEE ATTACHED FOR DETAILED DIRECTIONS     sucralfate (CARAFATE) 1 GM/10ML suspension TAKE 10 MLS (1 G TOTAL) BY MOUTH 4 (FOUR) TIMES DAILY - WITH MEALS AND AT BEDTIME. 420 mL 0   traMADol (ULTRAM) 50 MG tablet TAKE 1 TABLET BY MOUTH EVERY 6 (SIX) HOURS AS NEEDED FOR MODERATE PAIN (6-8 PER DAY DEPENDING ON PAIN). 90 tablet 0   Vilazodone HCl (VIIBRYD) 40 MG TABS Take 40 mg by mouth daily.     AMBULATORY NON FORMULARY MEDICATION Medication Name: Diltiazem 2% w/ Lidocaine 5% - Using your index finger, apply a small amount of medication inside the rectum up to your first knuckle/joint three times daily x 6-8 weeks. 30 g 1   hydrOXYzine (VISTARIL) 25 MG capsule  (Patient not taking: Reported on 03/13/2022)     inFLIXimab (REMICADE) 100 MG injection 7mg /kg     LORazepam (ATIVAN) 0.5 MG tablet Take 0.5 mg by mouth daily as needed.     metoCLOPramide (REGLAN) 10 MG tablet Take 1 tablet (10 mg total) by mouth every 8 (eight) hours as needed for up to 20  doses for nausea. 10 tablet 0   NON FORMULARY Ketorolac 0.5 1 drop right eye 3 times a day     pantoprazole (PROTONIX) 40 MG tablet Take 1 tablet (40 mg total) by mouth daily. 90 tablet 1   propranolol (INDERAL) 20 MG tablet Take 1 tablet (20 mg total) by mouth 2 (two) times daily. (Patient not taking: Reported on 03/14/2022) 60 tablet 6   Rimegepant Sulfate (NURTEC) 75 MG TBDP Take 75 mg by mouth as needed. (Patient not taking: Reported on 03/14/2022) 8 tablet 6   valACYclovir (VALTREX) 1000 MG tablet Take by mouth.     Current Facility-Administered Medications  Medication Dose Route Frequency Provider Last Rate Last Admin   0.9 %  sodium chloride infusion  500 mL Intravenous Once Sharyn Creamer, MD        Allergies as of 03/14/2022 - Review Complete 03/14/2022  Allergen Reaction Noted   Codeine Nausea And Vomiting [id number]   Garlic Nausea And Vomiting 01/18/2018   Other Nausea And Vomiting 01/18/2018   Sertraline Rash 12/12/2016   Methotrexate  01/29/2022    Family History  Problem Relation Age of Onset   Cancer Father        breast   Bipolar disorder Father    Anxiety disorder Father    Depression Father    Paranoid behavior Father    Heart disease Father    Stroke Maternal Grandmother    Hypertension Maternal Grandmother    Bipolar disorder Maternal Grandmother    Dementia Maternal Grandmother    Hypertension Maternal Grandfather    Stroke Paternal Grandmother    Hypertension Paternal Grandmother    Bipolar disorder Paternal Grandmother    Dementia Paternal Grandmother    Hypertension Paternal Grandfather    Stomach cancer Neg Hx    Colon cancer Neg Hx    Esophageal cancer Neg Hx     Social History   Socioeconomic History   Marital status: Married    Spouse name: Not on file   Number of children: 2   Years of education: Not on file   Highest education level: Bachelor's degree (e.g., BA, AB, BS)  Occupational History   Occupation: retired  Tobacco Use    Smoking status: Never   Smokeless tobacco: Never  Vaping Use   Vaping Use: Never used  Substance and Sexual Activity   Alcohol use: Not Currently    Alcohol/week: 2.0 standard drinks of alcohol    Types: 2 Glasses of wine per week    Comment: occasional glass of wine   Drug use: Never   Sexual activity: Yes    Partners: Male    Birth control/protection: None  Other Topics Concern   Not on file  Social History Narrative   Lives with husband   Social Determinants of Health   Financial Resource Strain: Low Risk  (01/12/2018)   Overall Financial Resource Strain (CARDIA)    Difficulty of Paying Living Expenses: Not hard at all  Food Insecurity: No Food Insecurity (01/12/2018)   Hunger Vital Sign    Worried About Running Out of Food in the Last Year: Never true    Ran Out of Food in the Last Year: Never true  Transportation Needs: No Transportation Needs (01/12/2018)   PRAPARE - Hydrologist (Medical): No    Lack of Transportation (Non-Medical): No  Physical Activity: Inactive (01/12/2018)   Exercise Vital Sign    Days of Exercise per Week: 0 days    Minutes of Exercise per Session: 0 min  Stress: Stress Concern Present (01/12/2018)   [place]    Feeling of Stress : Very much  Social Connections: Moderately Isolated (01/12/2018)   Social Connection and Isolation Panel [NHANES]    Frequency of Communication with Friends and Family: Once a week    Frequency of Social Gatherings with Friends and Family: Once a week    Attends Religious Services: Never    Marine scientist or Organizations: No    Attends Archivist Meetings: Never    Marital Status: Married  Human resources officer Violence:  Not At Risk (01/12/2018)   Humiliation, Afraid, Rape, and Kick questionnaire    Fear of Current or Ex-Partner: No    Emotionally Abused: No    Physically Abused: No    Sexually Abused: No     Physical Exam: Vital signs in last 24 hours: BP 132/75   Pulse 80   Temp (!) 97.3 F (36.3 C) (Temporal)   Ht 5\' 8"  (1.727 m)   Wt 160 lb (72.6 kg)   SpO2 99%   BMI 24.33 kg/m  GEN: NAD EYE: Sclerae anicteric ENT: MMM CV: Non-tachycardic Pulm: No increased WOB GI: Soft NEURO:  Alert & Oriented   Christia Reading, MD [facility]   03/14/2022 11:10 AM

## 2022-03-14 NOTE — Patient Instructions (Signed)
Discharge instructions given. Handout on Gastritis. Prescription sent to pharmacy. Resume previous medications. Office will call to schedule GI clinic for 6 weeks. YOU HAD AN ENDOSCOPIC PROCEDURE TODAY AT [facility]:   Refer to the procedure report that was given to you for any specific questions about what was found during the examination.  If the procedure report does not answer your questions, please call your gastroenterologist to clarify.  If you requested that your care partner not be given the details of your procedure findings, then the procedure report has been included in a sealed envelope for you to review at your convenience later.  YOU SHOULD EXPECT: Some feelings of bloating in the abdomen. Passage of more gas than usual.  Walking can help get rid of the air that was put into your GI tract during the procedure and reduce the bloating. If you had a lower endoscopy (such as a colonoscopy or flexible sigmoidoscopy) you may notice spotting of blood in your stool or on the toilet paper. If you underwent a bowel prep for your procedure, you may not have a normal bowel movement for a few days.  Please Note:  You might notice some irritation and congestion in your nose or some drainage.  This is from the oxygen used during your procedure.  There is no need for concern and it should clear up in a day or so.  SYMPTOMS TO REPORT IMMEDIATELY:   Following upper endoscopy (EGD)  Vomiting of blood or coffee ground material  New chest pain or pain under the shoulder blades  Painful or persistently difficult swallowing  New shortness of breath  Fever of 100F or higher  Black, tarry-looking stools  For urgent or emergent issues, a gastroenterologist can be reached at any hour by calling [phone]. Do not use MyChart messaging for urgent concerns.    DIET:  We do recommend a small meal at first, but then you may proceed to your regular diet.  Drink plenty of fluids but  you should avoid alcoholic beverages for 24 hours.  ACTIVITY:  You should plan to take it easy for the rest of today and you should NOT DRIVE or use heavy machinery until tomorrow (because of the sedation medicines used during the test).    FOLLOW UP: Our staff will call the number listed on your records the next business day following your procedure.  We will call around 7:15- 8:00 am to check on you and address any questions or concerns that you may have regarding the information given to you following your procedure. If we do not reach you, we will leave a message.     If any biopsies were taken you will be contacted by phone or by letter within the next 1-3 weeks.  Please call us at [phone] if you have not heard about the biopsies in 3 weeks.    SIGNATURES/CONFIDENTIALITY: You and/or your care partner have signed paperwork which will be entered into your electronic medical record.  These signatures attest to the fact that that the information above on your After Visit Summary has been reviewed and is understood.  Full responsibility of the confidentiality of this discharge information lies with you and/or your care-partner.

## 2022-03-14 NOTE — Progress Notes (Signed)
Called to room to assist during endoscopic procedure.  Patient ID and intended procedure confirmed with present staff. Received instructions for my participation in the procedure from the performing physician.  

## 2022-03-15 ENCOUNTER — Telehealth: Payer: Self-pay

## 2022-03-15 NOTE — Telephone Encounter (Signed)
Follow up call placed, VM obtained and message left. ?SChaplin, RN,BSN ? ?

## 2022-03-20 ENCOUNTER — Encounter: Payer: Self-pay | Admitting: Internal Medicine

## 2022-03-21 MED ORDER — NARATRIPTAN HCL 2.5 MG PO TABS
2.5000 mg | ORAL_TABLET | ORAL | 6 refills | Status: DC | PRN
Start: 2022-03-21 — End: 2022-11-06

## 2022-03-24 ENCOUNTER — Ambulatory Visit: Payer: Medicare Other | Attending: Psychiatry | Admitting: Physical Therapy

## 2022-03-24 DIAGNOSIS — M542 Cervicalgia: Secondary | ICD-10-CM

## 2022-03-24 DIAGNOSIS — M6281 Muscle weakness (generalized): Secondary | ICD-10-CM

## 2022-03-24 NOTE — Therapy (Signed)
OUTPATIENT PHYSICAL THERAPY CERVICAL EVALUATION   Patient Name: Tina Pena MRN: [id number] DOB:[birth year], 66 y.o., female Today's Date: 03/24/2022   PT End of Session - 03/24/22 1318     Visit Number 1    Number of Visits 9    Date for PT Re-Evaluation 05/23/22    Authorization Type Medicare    PT Start Time 1228    PT Stop Time 1313    PT Time Calculation (min) 45 min    Activity Tolerance Patient tolerated treatment well    Behavior During Therapy WFL for tasks assessed/performed             Past Medical History:  Diagnosis Date   Acid reflux    Allergy    Anal fissure    Arthritis    Barrett's esophagus    Bipolar disorder (HCC)    Cervical herniated disc    Chronic headaches    Chronic pain    Depression    Dermatomyositis (HCC)    GERD (gastroesophageal reflux disease)    H/O vitamin D deficiency    Hypercholesterolemia    IBS (irritable bowel syndrome)    Ocular migraine    Osteoporosis    Past Surgical History:  Procedure Laterality Date   APPENDECTOMY     GALLBLADDER SURGERY  2001   HERNIA REPAIR     RETINAL DETACHMENT SURGERY Right 11/29/2021   TONSILLECTOMY  1981   toupetfundoplicatio  2000   TUBAL LIGATION     Patient Active Problem List   Diagnosis Date Noted   Chronic constipation 02/21/2022   Gastroesophageal reflux disease 02/21/2022   Dysphagia 02/21/2022   Anal fissure 02/21/2022   Neck pain, chronic 10/20/2021   Cervical radiculopathy 10/20/2021   Calcific tendinitis of right shoulder 10/24/2018   MDD (major depressive disorder), recurrent episode, moderate (HCC) 01/12/2018   Vitamin D deficiency 05/11/2017   Alopecia 12/12/2016   Atrophy of vagina 12/12/2016   Depression 12/12/2016   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 Jake Samples (neurosurgeon) and had an MRI done and was found to have degenerative disc disease (per pt). PT unable to see this MRI in Epic as this is from an outside provider. Pt reports that she is not a candidate for surgery. In the last couple of weeks, have had a couple major migraines. One where she had to go to the ER and had to go for a migraine cocktail. Has had neck pain for about 20-25 years. Reports neck hurts all the time, mainly on the L side and then it can turn into a headache. Has had PT in the past (about 9 yrs ago) and has had traction and reports that is has helped. Doing some gentle AROM exercises, but is still painful. Reports ice helps the pain. No N/T down her arms. When she has migraines, gets dizzy and sick to her stomach.   PERTINENT HISTORY:  PMH: Arthritis, chronic migraines, depression, chronic neck pain, osteoporosis  PAIN:  Are you having pain? Yes: NPRS scale: 6/10 Pain location: Center of neck and a little to the L  Pain description: Sharp and sore. Aggravating factors: Working at a computer, driving for a long time, yard work. Relieving factors: Ice, tramadol   PRECAUTIONS: None  FALLS:  Has patient fallen in last 6 months? 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   PLOF: Independent  PATIENT GOALS Wants to not have any more migraine, do yardwork without stopping due to something hurting.   OBJECTIVE:   DIAGNOSTIC FINDINGS:  Pt reports MRI has been done by her neurosurgeon and it showed degenerative disc disease (PT unable to see this in Epic)  PATIENT SURVEYS:  NDI 20/50 =moderate disability     COGNITION: Overall cognitive status: Within functional limits for tasks assessed   SENSATION: WFL  POSTURE: rounded shoulders  PALPATION: Incr TTP with L > R upper trap, levator scap, rhomboids, and suboccipitals.  Pt with trigger points in L upper trap and levator scap, when therapist would palpate would shoot pain down pt's L arm.    Hypomobility to mid/upper thoracic spine (central and bilat), and lower cervical spine. Pt with TTP to these points as well as central, L>R upper cervical spine.   CERVICAL ROM:    Active ROM A/PROM (deg) eval  Flexion 33 (incr pain)  Extension 42 (did not hurt as bad)  Right lateral flexion 33 (incr pain)  Left lateral flexion 37 (incr pain)  Right rotation 50 (feels pressure)  Left rotation 52 (feels pressure)   (Blank rows = not tested)  UPPER EXTREMITY ROM:   WFL, pt slightly more limited with L shoulder ER (putting hand behind head)  UPPER EXTREMITY MMT:  BUE myotomes grossly 5/5 bilat   MMT Right eval Left eval  Shoulder flexion    Shoulder extension    Shoulder abduction    Shoulder adduction    Shoulder extension    Shoulder internal rotation    Shoulder external rotation    Middle trapezius 3+/5 3/5  Lower trapezius 4/5 3/5  Elbow flexion    Elbow extension    Wrist flexion    Wrist extension    Wrist ulnar deviation    Wrist radial deviation    Wrist pronation    Wrist supination    Grip strength     (Blank rows = not tested)  CERVICAL SPECIAL  TESTS:  Spurling's test: negative bilat, no N/T, but pt reports slight incr in pain    TODAY'S TREATMENT:  Access  Code: [email or web address] URL: [email or web address] Date: 03/24/2022 Prepared by: Sherlie Ban  Initiated HEP for gentle stretching: See MedBridge for more details. Pt able to tolerate well.   Exercises - Seated Upper Trapezius Stretch  - 1-2 x daily - 5 x weekly - 3 sets - 30 hold - Gentle Levator Scapulae Stretch  - 1-2 x daily - 5 x weekly - 3 sets - 30 hold   PATIENT EDUCATION:  Education details: Clinical findings, POC, potential for dry needling (explained what it was and purpose). Person educated: Patient Education method: Explanation, Demonstration, and Handouts Education comprehension: verbalized understanding and returned demonstration   HOME EXERCISE PROGRAM: [email or web address]  ASSESSMENT:  CLINICAL IMPRESSION: Patient is a 66 year old female referred to Neuro OPPT for Cervicalgia.   Pt's PMH is significant for: Arthritis, chronic migraines, depression, chronic neck pain, osteoporosis. The following deficits were present during the exam: impaired cervical AROM, decr postural strength, hypomobility of cervical/thoracic spine, incr TTP to L>R upper trap, levator scap, rhomboids, and suboccipitals, headaches. Pt's neck pain affects pt ability to perform recreational activities and other participation limitations such as yard work. Pt also with frequent headaches that can turn into a migraine. Pt would benefit from skilled PT to address these impairments and functional limitations to maximize functional mobility independence and decr pain/improve mobility.     OBJECTIVE IMPAIRMENTS decreased activity tolerance, decreased mobility, decreased ROM, decreased strength, dizziness, hypomobility, increased fascial restrictions, increased muscle spasms, impaired flexibility, impaired sensation, postural dysfunction, and pain.   ACTIVITY LIMITATIONS  pt able to perform, just has pain.  PARTICIPATION LIMITATIONS: community activity and yard work  PERSONAL FACTORS Past/current  experiences, Time since onset of injury/illness/exacerbation, and 3+ comorbidities: Arthritis, chronic migraines, depression, chronic neck pain, osteoporosis  are also affecting patient's functional outcome.   REHAB POTENTIAL: Good  CLINICAL DECISION MAKING: Stable/uncomplicated  EVALUATION COMPLEXITY: Low   GOALS: Goals reviewed with patient? Yes  SHORT TERM GOALS: ALL STGS = LTGS  LONG TERM GOALS: Target date: 04/21/2022  Pt will be independent with final HEP in order to build upon functional gains made in therapy. Baseline:  Goal status: INITIAL  2.  Pt will decr NDI score to a 14/50 or less in order to demo decr disability with neck pain.  Baseline: 20/50 Goal status: INITIAL  3.  Pt will verbalize being able to do yardwork continuously for at least 30 minutes in order to demo improved functional mobility.   Baseline: pt currently has to stop when doing yardwork due to pain.  Goal status: INITIAL  4.  Pt will improve cervical flexion AROM to at least 45 degrees in order to demo improved functional mobility.  Baseline: 33 decr with incr pain  Goal status: INITIAL   5.  Pt will improve R and L cervical AROM to at least 60 deg in order to demo improved AROM for driving   Baseline: R HENIDPOE:42 deg, L rotation: 52 degrees  Goal status: INITIAL    PLAN: PT FREQUENCY: 1-2x/week  PT DURATION: 8 weeks - do not anticipate pt needing all POC  PLANNED INTERVENTIONS: Therapeutic exercises, Therapeutic activity, Neuromuscular re-education, Balance training, Gait training, Patient/Family education, Self Care, Joint mobilization, Vestibular training, Dry Needling, Spinal mobilization, Cryotherapy, Moist heat, Traction, Manual therapy, and Re-evaluation  PLAN FOR NEXT SESSION: Trial dry needling. Add to HEP for cervical ROM/stretching, and postural strengthening. Exercises  for thoracic mobility.    Arliss Journey, PT, DPT  03/24/2022, 1:20 PM

## 2022-03-28 ENCOUNTER — Ambulatory Visit: Payer: Medicare Other | Attending: Psychiatry | Admitting: Physical Therapy

## 2022-03-28 DIAGNOSIS — R2689 Other abnormalities of gait and mobility: Secondary | ICD-10-CM | POA: Diagnosis not present

## 2022-03-28 DIAGNOSIS — M542 Cervicalgia: Secondary | ICD-10-CM | POA: Insufficient documentation

## 2022-03-28 DIAGNOSIS — R278 Other lack of coordination: Secondary | ICD-10-CM | POA: Diagnosis not present

## 2022-03-28 DIAGNOSIS — M6281 Muscle weakness (generalized): Secondary | ICD-10-CM | POA: Diagnosis present

## 2022-03-28 DIAGNOSIS — R2681 Unsteadiness on feet: Secondary | ICD-10-CM | POA: Diagnosis not present

## 2022-03-28 NOTE — Therapy (Signed)
OUTPATIENT PHYSICAL THERAPY CERVICAL TREATMENT   Patient Name: Tina Pena MRN: [id number] DOB:06-Apr-1956, 66 y.o., female Today's Date: 03/28/2022   PT End of Session - 03/28/22 1537     Visit Number 2    Number of Visits 9    Date for PT Re-Evaluation 05/23/22    Authorization Type Medicare    PT Start Time [id number]   pt late   PT Stop Time [id number]   pt late, treatment completed   PT Time Calculation (min) 30 min    Activity Tolerance Patient tolerated treatment well;No increased pain    Behavior During Therapy WFL for tasks assessed/performed              Past Medical History:  Diagnosis Date   Acid reflux    Allergy    Anal fissure    Arthritis    Barrett's esophagus    Bipolar disorder (HCC)    Cervical herniated disc    Chronic headaches    Chronic pain    Depression    Dermatomyositis (HCC)    GERD (gastroesophageal reflux disease)    H/O vitamin D deficiency    Hypercholesterolemia    IBS (irritable bowel syndrome)    Ocular migraine    Osteoporosis    Past Surgical History:  Procedure Laterality Date   APPENDECTOMY     GALLBLADDER SURGERY  2001   HERNIA REPAIR     RETINAL DETACHMENT SURGERY Right 11/29/2021   TONSILLECTOMY  [id number]   toupetfundoplicatio  [id number]   TUBAL LIGATION     Patient Active Problem List   Diagnosis Date Noted   Chronic constipation 02/21/2022   Gastroesophageal reflux disease 02/21/2022   Dysphagia 02/21/2022   Anal fissure 02/21/2022   Neck pain, chronic 10/20/2021   Cervical radiculopathy 10/20/2021   Calcific tendinitis of right shoulder 10/24/2018   MDD (major depressive disorder), recurrent episode, moderate ([place]) 01/12/2018   Vitamin D deficiency 05/11/2017   Alopecia 12/12/2016   Atrophy of vagina 12/12/2016   Depression 12/12/2016   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   Diverticulosis 12/12/2016   Fibromyalgia 12/12/2016   Gastritis 12/12/2016   H/O multiple allergies 12/12/2016   Herpes 12/12/2016   Iron deficiency  [id number]   Lichen simplex chronicus 12/12/2016   Migraines 12/12/2016   Mollusca contagiosa 12/12/2016    PCP: Allwardt, Randa Evens, PA-C  REFERRING PROVIDER: Genia Harold, MD  REFERRING DIAG: M54.2 (ICD-10-CM) - Cervicalgia   THERAPY DIAG:  Cervicalgia  Muscle weakness (generalized)  Rationale for Evaluation and Treatment Rehabilitation  ONSET DATE: 03/13/2022 (date of referral)  SUBJECTIVE:  SUBJECTIVE STATEMENT: Pt reports 4/10 neck pain today on L lateral side today. Pt reports she woke up with a headache this morning. Reports stretching have been working, had HA this aM but better after stretching. Pt reports she was able to do some yard work this weekend (was able to carry one brick in each hand)  PERTINENT HISTORY:  PMH: Arthritis, chronic migraines, depression, chronic neck pain, osteoporosis  PAIN:  Are you having pain? Yes: NPRS scale: 4/10 Pain location: Center of neck and a little to the L  Pain description: Sharp and sore. Aggravating factors: Working at a computer, driving for a long time, yard work. Relieving factors: Ice, tramadol   PRECAUTIONS: None  PATIENT GOALS Wants to not have any more migraine, do yardwork without stopping due to something hurting.   OBJECTIVE:   TODAY'S TREATMENT:  THER EX: Supine thoracic spine stretch Subocciptial release  MANUAL THERAPY: Supine suboccipital release x 5 min  THER ACT: Palpation of L UT, levator scap, and subocciptial. Pt with no palpable trigger points in UT or levator scap this date, does have TTP in suboccipitals.  Trigger Point Dry-Needling  Treatment instructions: Expect mild to moderate muscle soreness. S/S of pneumothorax if dry needled over a lung field, and to seek immediate medical attention  should they occur. Patient verbalized understanding of these instructions and education.  Patient Consent Given: Yes Education handout provided: Yes Muscles treated: L suboccipitals Treatment response/outcome: improvement in pain/tightness  Trigger Point Dry Needling  What is Trigger Point Dry Needling (DN)? DN is a physical therapy technique used to treat muscle pain and dysfunction. Specifically, DN helps deactivate muscle trigger points (muscle knots).  A thin filiform needle is used to penetrate the skin and stimulate the underlying trigger point. The goal is for a local twitch response (LTR) to occur and for the trigger point to relax. No medication of any kind is injected during the procedure.   What Does Trigger Point Dry Needling Feel Like?  The procedure feels different for each individual patient. Some patients report that they do not actually feel the needle enter the skin and overall the process is not painful. Very mild bleeding may occur. However, many patients feel a deep cramping in the muscle in which the needle was inserted. This is the local twitch response.   How Will I feel after the treatment? Soreness is normal, and the onset of soreness may not occur for a few hours. Typically this soreness does not last longer than two days.  Bruising is uncommon, however; ice can be used to decrease any possible bruising.  In rare cases feeling tired or nauseous after the treatment is normal. In addition, your symptoms may get worse before they get better, this period will typically not last longer than 24 hours.   What Can I do After My Treatment? Increase your hydration by drinking more water for the next 24 hours. You may place ice or heat on the areas treated that have become sore, however, do not use heat on inflamed or bruised areas. Heat often brings more relief post needling. You can continue your regular activities, but vigorous activity is not recommended initially after  the treatment for 24 hours. DN is best combined with other physical therapy such as strengthening, stretching, and other therapies.     PATIENT EDUCATION:  Education details: Clinical findings, POC, potential for dry needling (explained what it was and purpose)*** Person educated: Patient Education method: Explanation, Demonstration, and Handouts Education comprehension: verbalized  understanding and returned demonstration   HOME EXERCISE PROGRAM: Access Code: [email or web address] URL: [email or web address] Date: 03/24/2022 Prepared by: Janann August  Initiated HEP for gentle stretching: See MedBridge for more details. Pt able to tolerate well.   Exercises - Seated Upper Trapezius Stretch  - 1-2 x daily - 5 x weekly - 3 sets - 30 hold - Gentle Levator Scapulae Stretch  - 1-2 x daily - 5 x weekly - 3 sets - 30 hold - Supine Thoracic Mobilization Towel Roll Vertical with Arm Stretch  - 1 x daily - 7 x weekly - 1 sets - 10 reps - 15-30 sec hold - Supine Suboccipital Release with Tennis Balls  - 1 x daily - 7 x weekly - 1 sets - 1 reps - 5 min hold  ASSESSMENT:  CLINICAL IMPRESSION: Emphasis of skilled PT session*** Continue POC.     OBJECTIVE IMPAIRMENTS decreased activity tolerance, decreased mobility, decreased ROM, decreased strength, dizziness, hypomobility, increased fascial restrictions, increased muscle spasms, impaired flexibility, impaired sensation, postural dysfunction, and pain.   ACTIVITY LIMITATIONS  pt able to perform, just has pain.  PARTICIPATION LIMITATIONS: community activity and yard work  PERSONAL FACTORS Past/current experiences, Time since onset of injury/illness/exacerbation, and 3+ comorbidities: Arthritis, chronic migraines, depression, chronic neck pain, osteoporosis  are also affecting patient's functional outcome.   REHAB POTENTIAL: Good  CLINICAL DECISION MAKING: Stable/uncomplicated  EVALUATION COMPLEXITY: Low   GOALS: Goals reviewed  with patient? Yes  SHORT TERM GOALS: ALL STGS = LTGS  LONG TERM GOALS: Target date: 04/21/2022  Pt will be independent with final HEP in order to build upon functional gains made in therapy. Baseline:  Goal status: INITIAL  2.  Pt will decr NDI score to a 14/50 or less in order to demo decr disability with neck pain.  Baseline: 20/50 Goal status: INITIAL  3.  Pt will verbalize being able to do yardwork continuously for at least 30 minutes in order to demo improved functional mobility.   Baseline: pt currently has to stop when doing yardwork due to pain.  Goal status: INITIAL  4.  Pt will improve cervical flexion AROM to at least 45 degrees in order to demo improved functional mobility.  Baseline: 33 decr with incr pain  Goal status: INITIAL   5.  Pt will improve R and L cervical AROM to at least 60 deg in order to demo improved AROM for driving   Baseline: R rotation:50 deg, L rotation: 52 degrees  Goal status: INITIAL    PLAN: PT FREQUENCY: 1-2x/week  PT DURATION: 8 weeks - do not anticipate pt needing all POC  PLANNED INTERVENTIONS: Therapeutic exercises, Therapeutic activity, Neuromuscular re-education, Balance training, Gait training, Patient/Family education, Self Care, Joint mobilization, Vestibular training, Dry Needling, Spinal mobilization, Cryotherapy, Moist heat, Traction, Manual therapy, and Re-evaluation  PLAN FOR NEXT SESSION: Trial dry needling. Add to HEP for cervical ROM/stretching, and postural strengthening. Exercises for thoracic mobility. ***   Excell Seltzer, PT, DPT, CSRS  03/28/2022, 4:11 PM

## 2022-03-29 ENCOUNTER — Telehealth: Payer: Self-pay | Admitting: Physician Assistant

## 2022-03-29 NOTE — Telephone Encounter (Signed)
Copied from [place]. Topic: Medicare AWV >> Mar 29, 2022 11:47 AM Devoria Glassing wrote: Reason for CRM: Left message for patient to schedule Annual Wellness Visit.  Please schedule with Nurse Health Advisor Charlott Rakes, RN at [facility]. This appt can be telephone or office visit. Please call [phone] ask for [facility]

## 2022-04-04 ENCOUNTER — Ambulatory Visit: Payer: Medicare Other | Admitting: Physical Therapy

## 2022-04-04 ENCOUNTER — Telehealth: Payer: Self-pay | Admitting: Physical Therapy

## 2022-04-07 ENCOUNTER — Ambulatory Visit: Payer: Medicare Other | Admitting: Physical Therapy

## 2022-04-11 ENCOUNTER — Other Ambulatory Visit: Payer: Self-pay | Admitting: Physician Assistant

## 2022-04-11 ENCOUNTER — Ambulatory Visit: Payer: Medicare Other | Admitting: Physical Therapy

## 2022-04-11 DIAGNOSIS — M542 Cervicalgia: Secondary | ICD-10-CM

## 2022-04-11 DIAGNOSIS — M6281 Muscle weakness (generalized): Secondary | ICD-10-CM

## 2022-04-11 NOTE — Therapy (Signed)
OUTPATIENT PHYSICAL THERAPY CERVICAL TREATMENT   Patient Name: Tina Pena MRN: [id number] DOB:December 22, 1955, 66 y.o., female Today's Date: 04/11/2022   PT End of Session - 04/11/22 1319     Visit Number 3    Number of Visits 9    Date for PT Re-Evaluation 05/23/22    Authorization Type Medicare    PT Start Time 1315    PT Stop Time 1353    PT Time Calculation (min) 38 min    Activity Tolerance Patient tolerated treatment well;No increased pain    Behavior During Therapy WFL for tasks assessed/performed               Past Medical History:  Diagnosis Date   Acid reflux    Allergy    Anal fissure    Arthritis    Barrett's esophagus    Bipolar disorder (HCC)    Cervical herniated disc    Chronic headaches    Chronic pain    Depression    Dermatomyositis (HCC)    GERD (gastroesophageal reflux disease)    H/O vitamin D deficiency    Hypercholesterolemia    IBS (irritable bowel syndrome)    Ocular migraine    Osteoporosis    Past Surgical History:  Procedure Laterality Date   APPENDECTOMY     GALLBLADDER SURGERY  2001   HERNIA REPAIR     RETINAL DETACHMENT SURGERY Right 11/29/2021   TONSILLECTOMY  1478   toupetfundoplicatio  [id number]   TUBAL LIGATION     Patient Active Problem List   Diagnosis Date Noted   Chronic constipation 02/21/2022   Gastroesophageal reflux disease 02/21/2022   Dysphagia 02/21/2022   Anal fissure 02/21/2022   Neck pain, chronic 10/20/2021   Cervical radiculopathy 10/20/2021   Calcific tendinitis of right shoulder 10/24/2018   MDD (major depressive disorder), recurrent episode, moderate ([place]) 01/12/2018   Vitamin D deficiency 05/11/2017   Alopecia 12/12/2016   Atrophy of vagina 12/12/2016   Depression 12/12/2016   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   Diverticulosis 12/12/2016   Fibromyalgia 12/12/2016   Gastritis 12/12/2016   H/O multiple allergies 12/12/2016   Herpes 12/12/2016   Iron deficiency 21/30/8657   Lichen simplex chronicus  12/12/2016   Migraines 12/12/2016   Mollusca contagiosa 12/12/2016    PCP: Allwardt, Randa Evens, PA-C  REFERRING PROVIDER: Genia Harold, MD  REFERRING DIAG: M54.2 (ICD-10-CM) - Cervicalgia   THERAPY DIAG:  Cervicalgia  Muscle weakness (generalized)  Rationale for Evaluation and Treatment Rehabilitation  ONSET DATE: 03/13/2022 (date of referral)  SUBJECTIVE:  SUBJECTIVE STATEMENT: Pt reports relief of neck pain after last visit, has not had a migraine since last visit. Pt reports she is just starting to have more neck pain today and feels a migraine coming on but has been working on the stretches and those have been helpful, especially suboccipital release. Pt has been able to gradually do more yard work, still picking up bricks one in each hand.  PERTINENT HISTORY:  PMH: Arthritis, chronic migraines, depression, chronic neck pain, osteoporosis  PAIN:  Are you having pain? Yes: NPRS scale: 4/10 Pain location: Center of neck and a little to the L  Pain description: Sharp and sore. Aggravating factors: Working at a computer, driving for a long time, yard work. Relieving factors: Ice, tramadol   PRECAUTIONS: None  PATIENT GOALS Wants to not have any more migraine, do yardwork without stopping due to something hurting.   OBJECTIVE:   TODAY'S TREATMENT:  THER EX: Child's pose stretch (forwards, L, R: 2 x 30 sec each) Child's pose thread the needle L and R 2 x 30 sec each (decreased flexibility with this) Seated rows with red theraband 2 x 15 reps  Added stretches and exercise to HEP, see bolded below  THER ACT: Palpation of L UT, levator scap, and suboccipitals. Pt with no palpable trigger points in UT or levator scap this date, does have TTP in suboccipitals. TPDN to L and R  suboccipitals as detailed below:  Trigger Point Dry-Needling  Treatment instructions: Expect mild to moderate muscle soreness. S/S of pneumothorax if dry needled over a lung field, and to seek immediate medical attention should they occur. Patient verbalized understanding of these instructions and education.  Patient Consent Given: Yes Education handout provided: Previously provided Muscles treated: L and R suboccipitals Treatment response/outcome: improvement in pain/tightness, more pain/tightness in R vs L  Trigger Point Dry Needling  What is Trigger Point Dry Needling (DN)? DN is a physical therapy technique used to treat muscle pain and dysfunction. Specifically, DN helps deactivate muscle trigger points (muscle knots).  A thin filiform needle is used to penetrate the skin and stimulate the underlying trigger point. The goal is for a local twitch response (LTR) to occur and for the trigger point to relax. No medication of any kind is injected during the procedure.   What Does Trigger Point Dry Needling Feel Like?  The procedure feels different for each individual patient. Some patients report that they do not actually feel the needle enter the skin and overall the process is not painful. Very mild bleeding may occur. However, many patients feel a deep cramping in the muscle in which the needle was inserted. This is the local twitch response.   How Will I feel after the treatment? Soreness is normal, and the onset of soreness may not occur for a few hours. Typically this soreness does not last longer than two days.  Bruising is uncommon, however; ice can be used to decrease any possible bruising.  In rare cases feeling tired or nauseous after the treatment is normal. In addition, your symptoms may get worse before they get better, this period will typically not last longer than 24 hours.   What Can I do After My Treatment? Increase your hydration by drinking more water for the next 24  hours. You may place ice or heat on the areas treated that have become sore, however, do not use heat on inflamed or bruised areas. Heat often brings more relief post needling. You can continue your regular  activities, but vigorous activity is not recommended initially after the treatment for 24 hours. DN is best combined with other physical therapy such as strengthening, stretching, and other therapies.     PATIENT EDUCATION:  Education details: added to HEP, trigger point dry needling Person educated: Patient Education method: Explanation, Demonstration, and Handouts Education comprehension: verbalized understanding and returned demonstration   HOME EXERCISE PROGRAM: Access Code: [email or web address] URL: [email or web address] Date: 03/24/2022 Prepared by: Janann August  Initiated HEP for gentle stretching: See MedBridge for more details. Pt able to tolerate well.   Exercises - Seated Upper Trapezius Stretch  - 1-2 x daily - 5 x weekly - 3 sets - 30 hold - Gentle Levator Scapulae Stretch  - 1-2 x daily - 5 x weekly - 3 sets - 30 hold - Supine Thoracic Mobilization Towel Roll Vertical with Arm Stretch  - 1 x daily - 7 x weekly - 1 sets - 10 reps - 15-30 sec hold - Supine Suboccipital Release with Tennis Balls  - 1 x daily - 7 x weekly - 1 sets - 1 reps - 5 min hold - Seated Shoulder Row with Anchored Resistance  - 1 x daily - 7 x weekly - 3 sets - 15 reps - Child's Pose Stretch  - 1 x daily - 7 x weekly - 1 sets - 5 reps - 30 sec hold - Child's Pose with Thread the Needle  - 1 x daily - 7 x weekly - 1 sets - 3 reps - 30 sec hold   ASSESSMENT:  CLINICAL IMPRESSION: Emphasis of skilled PT session on performing TPDN and adding to HEP for upper back stretching and postural stabilization exercises. Pt has relief of pain and symptoms with TPDN and exhibits good response to addition of exercises this session. Pt continues to benefit from skilled therapy services to address ongoing  pain and migraines due cervicalgia. Continue POC.   OBJECTIVE IMPAIRMENTS decreased activity tolerance, decreased mobility, decreased ROM, decreased strength, dizziness, hypomobility, increased fascial restrictions, increased muscle spasms, impaired flexibility, impaired sensation, postural dysfunction, and pain.   ACTIVITY LIMITATIONS  pt able to perform, just has pain.  PARTICIPATION LIMITATIONS: community activity and yard work  PERSONAL FACTORS Past/current experiences, Time since onset of injury/illness/exacerbation, and 3+ comorbidities: Arthritis, chronic migraines, depression, chronic neck pain, osteoporosis  are also affecting patient's functional outcome.   REHAB POTENTIAL: Good  CLINICAL DECISION MAKING: Stable/uncomplicated  EVALUATION COMPLEXITY: Low   GOALS: Goals reviewed with patient? Yes  SHORT TERM GOALS: ALL STGS = LTGS  LONG TERM GOALS: Target date: 04/21/2022  Pt will be independent with final HEP in order to build upon functional gains made in therapy. Baseline:  Goal status: INITIAL  2.  Pt will decr NDI score to a 14/50 or less in order to demo decr disability with neck pain.  Baseline: 20/50 Goal status: INITIAL  3.  Pt will verbalize being able to do yardwork continuously for at least 30 minutes in order to demo improved functional mobility.   Baseline: pt currently has to stop when doing yardwork due to pain.  Goal status: INITIAL  4.  Pt will improve cervical flexion AROM to at least 45 degrees in order to demo improved functional mobility.  Baseline: 33 decr with incr pain  Goal status: INITIAL   5.  Pt will improve R and L cervical AROM to at least 60 deg in order to demo improved AROM for driving   Baseline: R rotation:50 deg, L rotation: 52 degrees  Goal status: INITIAL    PLAN: PT FREQUENCY: 1-2x/week  PT DURATION: 8 weeks - do not anticipate pt needing all POC  PLANNED INTERVENTIONS: Therapeutic exercises, Therapeutic activity,  Neuromuscular re-education, Balance training, Gait training, Patient/Family education, Self Care, Joint mobilization, Vestibular training, Dry Needling, Spinal mobilization, Cryotherapy, Moist heat, Traction, Manual therapy, and Re-evaluation  PLAN FOR NEXT SESSION: dry needling, Add to HEP for cervical ROM/stretching, and postural strengthening. Exercises for thoracic mobility. Chin tucks, cat/cow, carrying weight   Excell Seltzer, PT, DPT, CSRS  04/11/2022, 3:59 PM

## 2022-04-12 NOTE — Telephone Encounter (Signed)
Last OV: 02/28/22  Next OV: 07/24/22  Last filled: 02/20/22  Quantity: 90

## 2022-04-13 ENCOUNTER — Ambulatory Visit: Payer: Medicare Other | Admitting: Physical Therapy

## 2022-04-13 ENCOUNTER — Encounter: Payer: Self-pay | Admitting: Physical Therapy

## 2022-04-13 DIAGNOSIS — M542 Cervicalgia: Secondary | ICD-10-CM

## 2022-04-13 DIAGNOSIS — M6281 Muscle weakness (generalized): Secondary | ICD-10-CM

## 2022-04-13 NOTE — Therapy (Signed)
OUTPATIENT PHYSICAL THERAPY CERVICAL TREATMENT   Patient Name: Tina Pena MRN: [id number] DOB:1955-08-07, 66 y.o., female Today's Date: 04/13/2022   PT End of Session - 04/13/22 1022     Visit Number 4    Number of Visits 9    Date for PT Re-Evaluation 05/23/22    Authorization Type Medicare    PT Start Time 1021   pt late to session   PT Stop Time 1102    PT Time Calculation (min) 41 min    Activity Tolerance Patient tolerated treatment well    Behavior During Therapy WFL for tasks assessed/performed               Past Medical History:  Diagnosis Date   Acid reflux    Allergy    Anal fissure    Arthritis    Barrett's esophagus    Bipolar disorder ([place])    Cervical herniated disc    Chronic headaches    Chronic pain    Depression    Dermatomyositis ([place])    GERD (gastroesophageal reflux disease)    H/O vitamin D deficiency    Hypercholesterolemia    IBS (irritable bowel syndrome)    Ocular migraine    Osteoporosis    Past Surgical History:  Procedure Laterality Date   APPENDECTOMY     GALLBLADDER SURGERY  2001   HERNIA REPAIR     RETINAL DETACHMENT SURGERY Right 11/29/2021   TONSILLECTOMY  [id number]   toupetfundoplicatio  [id number]   TUBAL LIGATION     Patient Active Problem List   Diagnosis Date Noted   Chronic constipation 02/21/2022   Gastroesophageal reflux disease 02/21/2022   Dysphagia 02/21/2022   Anal fissure 02/21/2022   Neck pain, chronic 10/20/2021   Cervical radiculopathy 10/20/2021   Calcific tendinitis of right shoulder 10/24/2018   MDD (major depressive disorder), recurrent episode, moderate ([place]) 01/12/2018   Vitamin D deficiency 05/11/2017   Alopecia 12/12/2016   Atrophy of vagina 12/12/2016   Depression 12/12/2016   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   Diverticulosis 12/12/2016   Fibromyalgia 12/12/2016   Gastritis 12/12/2016   H/O multiple allergies 12/12/2016   Herpes 12/12/2016   Iron deficiency [id number]   Lichen simplex  chronicus 12/12/2016   Migraines 12/12/2016   Mollusca contagiosa 12/12/2016    PCP: Allwardt, Randa Evens, PA-C  REFERRING PROVIDER: Genia Harold, MD  REFERRING DIAG: M54.2 (ICD-10-CM) - Cervicalgia   THERAPY DIAG:  Cervicalgia  Muscle weakness (generalized)  Rationale for Evaluation and Treatment Rehabilitation  ONSET DATE: 03/13/2022 (date of referral)  SUBJECTIVE:  SUBJECTIVE STATEMENT: Pt reports the dry needling has been helping. When the headaches start to come on, can do the stretches and exercises and can help prevent it from coming. No migraines since she was last here. Not waking up with a headache every morning. Feels like neck is able to move easier.   PERTINENT HISTORY:  PMH: Arthritis, chronic migraines, depression, chronic neck pain, osteoporosis  PAIN:  Are you having pain? Yes: NPRS scale: 6/10 Pain location: Center of neck and a little to the L  Pain description: Sharp and sore. Aggravating factors: Working at a computer, driving for a long time, yard work. Relieving factors: Ice, tramadol   PRECAUTIONS: None  PATIENT GOALS Wants to not have any more migraine, do yardwork without stopping due to something hurting.   OBJECTIVE:   TODAY'S TREATMENT:  THER EX: Quadruped cat/cow x10 reps, cues for proper technique and cervical AROM and holding in neutral position if moving it is too much.  Quadruped trunk rotations x8 reps each side - reaching out to side and twisting under, pt reporting incr pain during wrist initially. Showed pt to either hold in fists or can have hands on blocks/books.  Sidelying open books/trunk rotations x10 reps each side with improved AROM with each rep, pt preferred doing on her back better.  Supine cervical retraction x10 reps into  pillow with 3 second hold  In prone with head on rolled pillow: x10 reps middle trap raises each side (T position), x10 reps bilat (less AROM with LLE), then with upper trap 'Y' raises x10 reps with RUE and x5 reps with LUE. Pt reporting pain going up through L neck on L side that subsided after performing.   Supine on foam roller, holding in 'T' position x30 seconds, then performed AROM bringing palms together overhead and opening x10 reps, holding in 'W' position x30 seconds, then gentle AROM bringing arms overhead and back in a W, cued for scap retraction.     PATIENT EDUCATION:  Education details: additions to HEP (see below)  Person educated: Patient Education method: Explanation, Demonstration, and Handouts Education comprehension: verbalized understanding and returned demonstration   HOME EXERCISE PROGRAM: Access Code: [email or web address] URL: [email or web address] Date: 04/13/2022 Prepared by: Janann August  Exercises - Seated Upper Trapezius Stretch  - 1-2 x daily - 5 x weekly - 3 sets - 30 hold - Gentle Levator Scapulae Stretch  - 1-2 x daily - 5 x weekly - 3 sets - 30 hold - Supine Thoracic Mobilization Towel Roll Vertical with Arm Stretch  - 1 x daily - 7 x weekly - 1 sets - 10 reps - 15-30 sec hold - Supine Suboccipital Release with Tennis Balls  - 1 x daily - 7 x weekly - 1 sets - 1 reps - 5 min hold - Seated Shoulder Row with Anchored Resistance  - 1 x daily - 7 x weekly - 3 sets - 15 reps - Child's Pose Stretch  - 1 x daily - 7 x weekly - 1 sets - 5 reps - 30 sec hold - Child's Pose with Thread the Needle  - 1 x daily - 7 x weekly - 1 sets - 3 reps - 30 sec hold  New additions on 04/13/22 - Cat Cow  - 1 x daily - 7 x weekly - 1 sets - 10 reps - Prone Single Arm Shoulder Y  - 1 x daily - 7 x weekly - 1 sets - 10 reps - Prone Scapular Retraction Arms at  Side  - 1 x daily - 7 x weekly - 2 sets - 10 reps  ASSESSMENT:  CLINICAL IMPRESSION: Today's skilled session  continued to focus on upper back stretching and postural stabilization/strengthening exercises. Pt challenged by prone exercises with LUE when strengthening mid and lower trap (esp lower trap with LUE). Pt able to tolerate session well. Did have to modify quadruped exercises by having blocks under hands or holding hands in fists due to wrist pain. Will continue to progress towards LTGs.     OBJECTIVE IMPAIRMENTS decreased activity tolerance, decreased mobility, decreased ROM, decreased strength, dizziness, hypomobility, increased fascial restrictions, increased muscle spasms, impaired flexibility, impaired sensation, postural dysfunction, and pain.   ACTIVITY LIMITATIONS  pt able to perform, just has pain.  PARTICIPATION LIMITATIONS: community activity and yard work  PERSONAL FACTORS Past/current experiences, Time since onset of injury/illness/exacerbation, and 3+ comorbidities: Arthritis, chronic migraines, depression, chronic neck pain, osteoporosis  are also affecting patient's functional outcome.   REHAB POTENTIAL: Good  CLINICAL DECISION MAKING: Stable/uncomplicated  EVALUATION COMPLEXITY: Low   GOALS: Goals reviewed with patient? Yes  SHORT TERM GOALS: ALL STGS = LTGS  LONG TERM GOALS: Target date: 04/21/2022  Pt will be independent with final HEP in order to build upon functional gains made in therapy. Baseline:  Goal status: INITIAL  2.  Pt will decr NDI score to a 14/50 or less in order to demo decr disability with neck pain.  Baseline: 20/50 Goal status: INITIAL  3.  Pt will verbalize being able to do yardwork continuously for at least 30 minutes in order to demo improved functional mobility.   Baseline: pt currently has to stop when doing yardwork due to pain.  Goal status: INITIAL  4.  Pt will improve cervical flexion AROM to at least 45 degrees in order to demo improved functional mobility.  Baseline: 33 decr with incr pain  Goal status: INITIAL   5.  Pt will  improve R and L cervical AROM to at least 60 deg in order to demo improved AROM for driving   Baseline: R rotation:50 deg, L rotation: 52 degrees  Goal status: INITIAL    PLAN: PT FREQUENCY: 1-2x/week  PT DURATION: 8 weeks - do not anticipate pt needing all POC  PLANNED INTERVENTIONS: Therapeutic exercises, Therapeutic activity, Neuromuscular re-education, Balance training, Gait training, Patient/Family education, Self Care, Joint mobilization, Vestibular training, Dry Needling, Spinal mobilization, Cryotherapy, Moist heat, Traction, Manual therapy, and Re-evaluation  PLAN FOR NEXT SESSION: Check goals. dry needling, Add to HEP for cervical ROM/stretching, and postural strengthening. Exercises for thoracic mobility. Chin tucks, cat/cow, carrying weight   Arliss Journey, PT, DPT 04/13/2022, 11:08 AM

## 2022-04-18 ENCOUNTER — Ambulatory Visit: Payer: Medicare Other | Admitting: Physical Therapy

## 2022-04-18 ENCOUNTER — Encounter: Payer: Self-pay | Admitting: Physical Therapy

## 2022-04-18 DIAGNOSIS — M542 Cervicalgia: Secondary | ICD-10-CM | POA: Diagnosis not present

## 2022-04-18 DIAGNOSIS — M6281 Muscle weakness (generalized): Secondary | ICD-10-CM

## 2022-04-18 NOTE — Therapy (Addendum)
OUTPATIENT PHYSICAL THERAPY CERVICAL TREATMENT   Patient Name: Tina Pena MRN: [id number] DOB:10/13/1955, 66 y.o., female Today's Date: 04/18/2022   PT End of Session - 04/18/22 1501     Visit Number 5    Number of Visits 9    Date for PT Re-Evaluation 05/23/22    Authorization Type Medicare    PT Start Time 1500   pt late to appt   PT Stop Time 1531    PT Time Calculation (min) 31 min    Activity Tolerance Patient tolerated treatment well    Behavior During Therapy WFL for tasks assessed/performed               Past Medical History:  Diagnosis Date   Acid reflux    Allergy    Anal fissure    Arthritis    Barrett's esophagus    Bipolar disorder ([facility])    Cervical herniated disc    Chronic headaches    Chronic pain    Depression    Dermatomyositis ([facility])    GERD (gastroesophageal reflux disease)    H/O vitamin D deficiency    Hypercholesterolemia    IBS (irritable bowel syndrome)    Ocular migraine    Osteoporosis    Past Surgical History:  Procedure Laterality Date   APPENDECTOMY     GALLBLADDER SURGERY  2001   HERNIA REPAIR     RETINAL DETACHMENT SURGERY Right 11/29/2021   TONSILLECTOMY  1981   toupetfundoplicatio  2000   TUBAL LIGATION     Patient Active Problem List   Diagnosis Date Noted   Chronic constipation 02/21/2022   Gastroesophageal reflux disease 02/21/2022   Dysphagia 02/21/2022   Anal fissure 02/21/2022   Neck pain, chronic 10/20/2021   Cervical radiculopathy 10/20/2021   Calcific tendinitis of right shoulder 10/24/2018   MDD (major depressive disorder), recurrent episode, moderate ([facility]) 01/12/2018   Vitamin D deficiency 05/11/2017   Alopecia 12/12/2016   Atrophy of vagina 12/12/2016   Depression 12/12/2016   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   Diverticulosis 12/12/2016   Fibromyalgia 12/12/2016   Gastritis 12/12/2016   H/O multiple allergies 12/12/2016   Herpes 12/12/2016   Iron deficiency 12/12/2016   Lichen simplex chronicus  12/12/2016   Migraines 12/12/2016   Mollusca contagiosa 12/12/2016    PCP: Allwardt, Crist Infante, PA-C  REFERRING PROVIDER: Ocie Doyne, MD  REFERRING DIAG: M54.2 (ICD-10-CM) - Cervicalgia   THERAPY DIAG:  Cervicalgia  Muscle weakness (generalized)  Rationale for Evaluation and Treatment Rehabilitation  ONSET DATE: 03/13/2022 (date of referral)  SUBJECTIVE:  SUBJECTIVE STATEMENT: Woke up with a little headache today. Reports stiffness on both sides of the neck today. Started this morning, does not know what happened. Having difficulty moving it today.   PERTINENT HISTORY:  PMH: Arthritis, chronic migraines, depression, chronic neck pain, osteoporosis  PAIN:  Are you having pain? Yes: NPRS scale: 6/10 Pain location: Both sides of the neck Pain description: Sharp and sore. Aggravating factors: Working at a computer, driving for a long time, yard work. Relieving factors: Ice, tramadol   PRECAUTIONS: None  PATIENT GOALS Wants to not have any more migraine, do yardwork without stopping due to something hurting.   OBJECTIVE:   TODAY'S TREATMENT:  MANUAL THERAPY Suboccipital release 2 x 45 seconds Upper trap stretch 3 x 30 seconds bilat, with PT providing gentle overpressure/depression at pt's shoulder. Pt more tight on L side  Prone on mat table: Grade II mobilizations for pain relief for C2/C3 and C3/4. Performed 4 bouts of 30 seconds each. Pt reporting no change in pain with this. Initially reported relief, but did not have any.  THERAPEUTIC EXERCISE Supine on long foam roller: with arms extended and palms up holding for a stretch 2 x 30 seconds, then with arms in a W holding x30 seconds, then performed holding arms out in a W and bringing arms together and then opening back  up.   Trigger Point Dry-Needling  Treatment instructions: Expect mild to moderate muscle soreness. S/S of pneumothorax if dry needled over a lung field, and to seek immediate medical attention should they occur. Patient verbalized understanding of these instructions and education.  Patient Consent Given: Yes Education handout provided: Previously provided Muscles treated: L and R suboccipitals Treatment response/outcome: decreased pain per pt report; decreased palpable trigger points  Trigger Point Dry Needling  What is Trigger Point Dry Needling (DN)? DN is a physical therapy technique used to treat muscle pain and dysfunction. Specifically, DN helps deactivate muscle trigger points (muscle knots).  A thin filiform needle is used to penetrate the skin and stimulate the underlying trigger point. The goal is for a local twitch response (LTR) to occur and for the trigger point to relax. No medication of any kind is injected during the procedure.   What Does Trigger Point Dry Needling Feel Like?  The procedure feels different for each individual patient. Some patients report that they do not actually feel the needle enter the skin and overall the process is not painful. Very mild bleeding may occur. However, many patients feel a deep cramping in the muscle in which the needle was inserted. This is the local twitch response.   How Will I feel after the treatment? Soreness is normal, and the onset of soreness may not occur for a few hours. Typically this soreness does not last longer than two days.  Bruising is uncommon, however; ice can be used to decrease any possible bruising.  In rare cases feeling tired or nauseous after the treatment is normal. In addition, your symptoms may get worse before they get better, this period will typically not last longer than 24 hours.   What Can I do After My Treatment? Increase your hydration by drinking more water for the next 24 hours. You may place ice or  heat on the areas treated that have become sore, however, do not use heat on inflamed or bruised areas. Heat often brings more relief post needling. You can continue your regular activities, but vigorous activity is not recommended initially after the treatment for 24  hours. DN is best combined with other physical therapy such as strengthening, stretching, and other therapies.     PATIENT EDUCATION:  Education details: Continue with HEP, TPDN as noted above Person educated: Patient Education method: Explanation, Demonstration, and Handouts Education comprehension: verbalized understanding and returned demonstration   HOME EXERCISE PROGRAM: Access Code: [email or web address] URL: [email or web address] Date: 04/13/2022 Prepared by: Sherlie Ban  Exercises - Seated Upper Trapezius Stretch  - 1-2 x daily - 5 x weekly - 3 sets - 30 hold - Gentle Levator Scapulae Stretch  - 1-2 x daily - 5 x weekly - 3 sets - 30 hold - Supine Thoracic Mobilization Towel Roll Vertical with Arm Stretch  - 1 x daily - 7 x weekly - 1 sets - 10 reps - 15-30 sec hold - Supine Suboccipital Release with Tennis Balls  - 1 x daily - 7 x weekly - 1 sets - 1 reps - 5 min hold - Seated Shoulder Row with Anchored Resistance  - 1 x daily - 7 x weekly - 3 sets - 15 reps - Child's Pose Stretch  - 1 x daily - 7 x weekly - 1 sets - 5 reps - 30 sec hold - Child's Pose with Thread the Needle  - 1 x daily - 7 x weekly - 1 sets - 3 reps - 30 sec hold - Cat Cow  - 1 x daily - 7 x weekly - 1 sets - 10 reps - Prone Single Arm Shoulder Y  - 1 x daily - 7 x weekly - 1 sets - 10 reps - Prone Scapular Retraction Arms at Side  - 1 x daily - 7 x weekly - 2 sets - 10 reps  ASSESSMENT:  CLINICAL IMPRESSION: Session limited today due to pt arriving late. Pt with incr pain, discomfort, and stiffness today in her neck. When pt seated on the mat initially, pt had to turn her body as she was unable to turn her head towards therapist. 2nd  PT, Peter Congo (who has seen pt before) dry needled bilat suboccipitals due to incr TTP and trigger points. Pt feeling much relief afterwards and had decr pain and improved ROM. Remainder of session continued to focus on ROM/stretching and posture exercises. Pt feeling much better at end of session. Will continue per POC.     OBJECTIVE IMPAIRMENTS decreased activity tolerance, decreased mobility, decreased ROM, decreased strength, dizziness, hypomobility, increased fascial restrictions, increased muscle spasms, impaired flexibility, impaired sensation, postural dysfunction, and pain.   ACTIVITY LIMITATIONS  pt able to perform, just has pain.  PARTICIPATION LIMITATIONS: community activity and yard work  PERSONAL FACTORS Past/current experiences, Time since onset of injury/illness/exacerbation, and 3+ comorbidities: Arthritis, chronic migraines, depression, chronic neck pain, osteoporosis  are also affecting patient's functional outcome.   REHAB POTENTIAL: Good  CLINICAL DECISION MAKING: Stable/uncomplicated  EVALUATION COMPLEXITY: Low   GOALS: Goals reviewed with patient? Yes  SHORT TERM GOALS: ALL STGS = LTGS  LONG TERM GOALS: Target date: 04/21/2022  Pt will be independent with final HEP in order to build upon functional gains made in therapy. Baseline:  Goal status: INITIAL  2.  Pt will decr NDI score to a 14/50 or less in order to demo decr disability with neck pain.  Baseline: 20/50 Goal status: INITIAL  3.  Pt will verbalize being able to do yardwork continuously for at least 30 minutes in order to demo improved functional mobility.   Baseline: pt currently has to stop when doing yardwork due to  pain.  Goal status: INITIAL  4.  Pt will improve cervical flexion AROM to at least 45 degrees in order to demo improved functional mobility.  Baseline: 33 decr with incr pain  Goal status: INITIAL   5.  Pt will improve R and L cervical AROM to at least 60 deg in order to  demo improved AROM for driving   Baseline: R ZOXWRUEA:54 deg, L rotation: 52 degrees  Goal status: INITIAL    PLAN: PT FREQUENCY: 1-2x/week  PT DURATION: 8 weeks - do not anticipate pt needing all POC  PLANNED INTERVENTIONS: Therapeutic exercises, Therapeutic activity, Neuromuscular re-education, Balance training, Gait training, Patient/Family education, Self Care, Joint mobilization, Vestibular training, Dry Needling, Spinal mobilization, Cryotherapy, Moist heat, Traction, Manual therapy, and Re-evaluation  PLAN FOR NEXT SESSION: Check goals (didn't get the chance to do that due to pt being too symptomatic), dry needling, Add to HEP for cervical ROM/stretching, and postural strengthening. Exercises for thoracic mobility. Chin tucks, cat/cow, carrying weight   Drake Leach, PT, DPT Peter Congo, PT, DPT, CSRS  04/18/2022, 5:02 PM

## 2022-04-19 ENCOUNTER — Other Ambulatory Visit: Payer: Self-pay | Admitting: Family Medicine

## 2022-04-25 ENCOUNTER — Telehealth: Payer: Self-pay | Admitting: Physician Assistant

## 2022-04-25 ENCOUNTER — Ambulatory Visit: Payer: Medicare Other | Admitting: Physical Therapy

## 2022-04-25 NOTE — Telephone Encounter (Signed)
Copied from [place] [phone]. Topic: Medicare AWV >> Apr 25, 2022 12:23 PM Gillis Santa wrote: Reason for CRM: LVM FOR PATIENT TO CALL [place]

## 2022-04-26 ENCOUNTER — Ambulatory Visit: Payer: Medicare Other | Admitting: Physical Therapy

## 2022-04-26 NOTE — Therapy (Incomplete)
OUTPATIENT PHYSICAL THERAPY CERVICAL TREATMENT   Patient Name: Tina Pena MRN: [id number] DOB:June 15, 1956, 66 y.o., female Today's Date: 04/26/2022       Past Medical History:  Diagnosis Date   Acid reflux    Allergy    Anal fissure    Arthritis    Barrett's esophagus    Bipolar disorder ([place])    Cervical herniated disc    Chronic headaches    Chronic pain    Depression    Dermatomyositis ([place])    GERD (gastroesophageal reflux disease)    H/O vitamin D deficiency    Hypercholesterolemia    IBS (irritable bowel syndrome)    Ocular migraine    Osteoporosis    Past Surgical History:  Procedure Laterality Date   APPENDECTOMY     GALLBLADDER SURGERY  2001   HERNIA REPAIR     RETINAL DETACHMENT SURGERY Right 11/29/2021   TONSILLECTOMY  4709   toupetfundoplicatio  6283   TUBAL LIGATION     Patient Active Problem List   Diagnosis Date Noted   Chronic constipation 02/21/2022   Gastroesophageal reflux disease 02/21/2022   Dysphagia 02/21/2022   Anal fissure 02/21/2022   Neck pain, chronic 10/20/2021   Cervical radiculopathy 10/20/2021   Calcific tendinitis of right shoulder 10/24/2018   MDD (major depressive disorder), recurrent episode, moderate ([place]) 01/12/2018   Vitamin D deficiency 05/11/2017   Alopecia 12/12/2016   Atrophy of vagina 12/12/2016   Depression 12/12/2016   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  Rationale for Evaluation and Treatment Rehabilitation  ONSET DATE: 03/13/2022 (date of referral)  SUBJECTIVE:                                                                                                                                                                                                          SUBJECTIVE STATEMENT: ***  PERTINENT HISTORY:  PMH: Arthritis, chronic migraines, depression, chronic neck pain, osteoporosis  PAIN:  Are you having pain? Yes: NPRS scale: 6/10 Pain location: Both sides of the neck Pain description: Sharp and sore. Aggravating factors: Working at a computer, driving for a long time, yard work. Relieving factors: Ice, tramadol  PRECAUTIONS: None  PATIENT GOALS Wants to not have any more migraine, do yardwork without stopping due to something hurting.   OBJECTIVE:   TODAY'S TREATMENT:  MANUAL THERAPY *** THERAPEUTIC EXERCISE ***  Trigger Point Dry-Needling  Treatment instructions: Expect mild to moderate muscle soreness. S/S of pneumothorax if dry needled over a lung field, and to seek immediate medical attention should they occur. Patient verbalized understanding of these instructions and education.  Patient Consent Given: Yes Education handout provided: Previously provided Muscles treated: L and R suboccipitals Treatment response/outcome: decreased pain per pt report; decreased palpable trigger points  Trigger Point Dry Needling  What is Trigger Point Dry Needling (DN)? DN is a physical therapy technique used to treat muscle pain and dysfunction. Specifically, DN helps deactivate muscle trigger points (muscle knots).  A thin filiform needle is used to penetrate the skin and stimulate the underlying trigger point. The goal is for a local twitch response (LTR) to occur and for the trigger point to relax. No medication of any kind is injected during the procedure.   What Does Trigger Point Dry Needling Feel Like?  The procedure feels different for each individual patient. Some patients report that they do not actually feel the needle enter the skin and overall the process  is not painful. Very mild bleeding may occur. However, many patients feel a deep cramping in the muscle in which the needle was inserted. This is the local twitch response.   How Will I feel after the treatment? Soreness is normal, and the onset of soreness may not occur for a few hours. Typically this soreness does not last longer than two days.  Bruising is uncommon, however; ice can be used to decrease any possible bruising.  In rare cases feeling tired or nauseous after the treatment is normal. In addition, your symptoms may get worse before they get better, this period will typically not last longer than 24 hours.   What Can I do After My Treatment? Increase your hydration by drinking more water for the next 24 hours. You may place ice or heat on the areas treated that have become sore, however, do not use heat on inflamed or bruised areas. Heat often brings more relief post needling. You can continue your regular activities, but vigorous activity is not recommended initially after the treatment for 24 hours. DN is best combined with other physical therapy such as strengthening, stretching, and other therapies.     PATIENT EDUCATION:  Education details: Continue with HEP, TPDN as noted above*** Person educated: Patient Education method: Explanation, Demonstration, and Handouts Education comprehension: verbalized understanding and returned demonstration   HOME EXERCISE PROGRAM: Access Code: [email or web address] URL: [email or web address] Date: 04/13/2022 Prepared by: Sherlie Ban  Exercises - Seated Upper Trapezius Stretch  - 1-2 x daily - 5 x weekly - 3 sets - 30 hold - Gentle Levator Scapulae Stretch  - 1-2 x daily - 5 x weekly - 3 sets - 30 hold - Supine Thoracic Mobilization Towel Roll Vertical with Arm Stretch  - 1 x daily - 7 x weekly - 1 sets - 10 reps - 15-30 sec hold - Supine Suboccipital Release with Tennis Balls  - 1 x daily - 7 x weekly - 1 sets - 1 reps - 5 min  hold - Seated Shoulder Row with Anchored Resistance  - 1 x daily - 7 x weekly - 3 sets - 15 reps - Child's Pose Stretch  - 1 x daily - 7 x weekly - 1  sets - 5 reps - 30 sec hold - Child's Pose with Thread the Needle  - 1 x daily - 7 x weekly - 1 sets - 3 reps - 30 sec hold - Cat Cow  - 1 x daily - 7 x weekly - 1 sets - 10 reps - Prone Single Arm Shoulder Y  - 1 x daily - 7 x weekly - 1 sets - 10 reps - Prone Scapular Retraction Arms at Side  - 1 x daily - 7 x weekly - 2 sets - 10 reps  ASSESSMENT:  CLINICAL IMPRESSION: Emphasis of skilled PT session*** Continue POC.     OBJECTIVE IMPAIRMENTS decreased activity tolerance, decreased mobility, decreased ROM, decreased strength, dizziness, hypomobility, increased fascial restrictions, increased muscle spasms, impaired flexibility, impaired sensation, postural dysfunction, and pain.   ACTIVITY LIMITATIONS  pt able to perform, just has pain.  PARTICIPATION LIMITATIONS: community activity and yard work  PERSONAL FACTORS Past/current experiences, Time since onset of injury/illness/exacerbation, and 3+ comorbidities: Arthritis, chronic migraines, depression, chronic neck pain, osteoporosis  are also affecting patient's functional outcome.   REHAB POTENTIAL: Good  CLINICAL DECISION MAKING: Stable/uncomplicated  EVALUATION COMPLEXITY: Low   GOALS: Goals reviewed with patient? Yes  SHORT TERM GOALS: ALL STGS = LTGS  LONG TERM GOALS: Target date: 04/21/2022***  Pt will be independent with final HEP in order to build upon functional gains made in therapy. Baseline:  Goal status: INITIAL  2.  Pt will decr NDI score to a 14/50 or less in order to demo decr disability with neck pain.  Baseline: 20/50 Goal status: INITIAL  3.  Pt will verbalize being able to do yardwork continuously for at least 30 minutes in order to demo improved functional mobility.   Baseline: pt currently has to stop when doing yardwork due to pain.  Goal status:  INITIAL  4.  Pt will improve cervical flexion AROM to at least 45 degrees in order to demo improved functional mobility.  Baseline: 33 decr with incr pain  Goal status: INITIAL   5.  Pt will improve R and L cervical AROM to at least 60 deg in order to demo improved AROM for driving   Baseline: R WNUUVOZD:66 deg, L rotation: 52 degrees  Goal status: INITIAL    PLAN: PT FREQUENCY: 1-2x/week  PT DURATION: 8 weeks - do not anticipate pt needing all POC  PLANNED INTERVENTIONS: Therapeutic exercises, Therapeutic activity, Neuromuscular re-education, Balance training, Gait training, Patient/Family education, Self Care, Joint mobilization, Vestibular training, Dry Needling, Spinal mobilization, Cryotherapy, Moist heat, Traction, Manual therapy, and Re-evaluation  PLAN FOR NEXT SESSION: Check goals (didn't get the chance to do that due to pt being too symptomatic), dry needling, Add to HEP for cervical ROM/stretching, and postural strengthening. Exercises for thoracic mobility. Chin tucks, cat/cow, carrying weight***   Peter Congo, PT, DPT, CSRS   04/26/2022, 9:01 AM

## 2022-04-28 ENCOUNTER — Telehealth: Payer: Self-pay | Admitting: Physician Assistant

## 2022-04-28 ENCOUNTER — Encounter: Payer: Self-pay | Admitting: Physician Assistant

## 2022-04-28 ENCOUNTER — Ambulatory Visit (INDEPENDENT_AMBULATORY_CARE_PROVIDER_SITE_OTHER): Payer: Medicare Other | Admitting: Physician Assistant

## 2022-04-28 VITALS — BP 108/66 | HR 70 | Temp 97.7°F | Ht 68.0 in | Wt 156.6 lb

## 2022-04-28 DIAGNOSIS — R519 Headache, unspecified: Secondary | ICD-10-CM | POA: Diagnosis not present

## 2022-04-28 DIAGNOSIS — R350 Frequency of micturition: Secondary | ICD-10-CM | POA: Diagnosis not present

## 2022-04-28 DIAGNOSIS — N390 Urinary tract infection, site not specified: Secondary | ICD-10-CM

## 2022-04-28 LAB — POCT URINALYSIS DIPSTICK
Bilirubin, UA: NEGATIVE
Blood, UA: POSITIVE
Glucose, UA: NEGATIVE
Nitrite, UA: POSITIVE
Protein, UA: POSITIVE — AB
Spec Grav, UA: >=1.03 — AB (ref 1.010–1.025)
Urobilinogen, UA: 0.2 E.U./dL
pH, UA: 5 (ref 5.0–8.0)

## 2022-04-28 LAB — POC COVID19 BINAXNOW: SARS Coronavirus 2 Ag: NEGATIVE

## 2022-04-28 NOTE — Progress Notes (Unsigned)
Subjective:    Patient ID: Tina Pena, female    DOB: 1956-03-15, 66 y.o.   MRN: [id number]  Chief Complaint  Patient presents with   Migraine    Pt c/o constant migraines has seen Neurology, I at infusion a few weeks ago BP was elevated; regular walking and exercise can't catch breath; pt states it became clear now she has a bladder infection. Symptoms include urgency, cloudy urine, pain and pressure in lower abdomen.     HPI Patient is in today initially making appt for migraine.  Losing breath with doing yard work, having more headaches. Started back on propranolol 20 mg BID.   Also having UTI symptoms again despite taking Cephalexin 250 mg daily.     Past Medical History:  Diagnosis Date   Acid reflux    Allergy    Anal fissure    Arthritis    Barrett's esophagus    Bipolar disorder ([facility])    Cervical herniated disc    Chronic headaches    Chronic pain    Depression    Dermatomyositis ([facility])    GERD (gastroesophageal reflux disease)    H/O vitamin D deficiency    Hypercholesterolemia    IBS (irritable bowel syndrome)    Ocular migraine    Osteoporosis     Past Surgical History:  Procedure Laterality Date   APPENDECTOMY     GALLBLADDER SURGERY  2001   HERNIA REPAIR     RETINAL DETACHMENT SURGERY Right 11/29/2021   TONSILLECTOMY  7867   toupetfundoplicatio  6720   TUBAL LIGATION      Family History  Problem Relation Age of Onset   Cancer Father        breast   Bipolar disorder Father    Anxiety disorder Father    Depression Father    Paranoid behavior Father    Heart disease Father    Stroke Maternal Grandmother    Hypertension Maternal Grandmother    Bipolar disorder Maternal Grandmother    Dementia Maternal Grandmother    Hypertension Maternal Grandfather    Stroke Paternal Grandmother    Hypertension Paternal Grandmother    Bipolar disorder Paternal Grandmother    Dementia Paternal Grandmother    Hypertension Paternal Grandfather     Stomach cancer Neg Hx    Colon cancer Neg Hx    Esophageal cancer Neg Hx     Social History   Tobacco Use   Smoking status: Never   Smokeless tobacco: Never  Vaping Use   Vaping Use: Never used  Substance Use Topics   Alcohol use: Not Currently    Alcohol/week: 2.0 standard drinks of alcohol    Types: 2 Glasses of wine per week    Comment: occasional glass of wine   Drug use: Never     Allergies  Allergen Reactions   Codeine Nausea And Vomiting    Other reaction(s): extreme stomach upset, GI Intolerance   Garlic Nausea And Vomiting   Other Nausea And Vomiting    Onions    Sertraline Rash    Other reaction(s): Confusion Other reaction(s): confusion, Other (See Comments) Other reaction(s): Confusion    Methotrexate     Other reaction(s): elevated LFT's Other reaction(s): elevated LFT's     Review of Systems NEGATIVE UNLESS OTHERWISE INDICATED IN HPI      Objective:     BP 108/66 (BP Location: Left Arm)   Pulse 70   Temp 97.7 F (36.5 C) (Temporal)   Ht 5\' 8"  (  1.727 m)   Wt 156 lb 9.6 oz (71 kg)   SpO2 98%   BMI 23.81 kg/m   Wt Readings from Last 3 Encounters:  04/28/22 156 lb 9.6 oz (71 kg)  03/14/22 160 lb (72.6 kg)  03/13/22 163 lb 2 oz (74 kg)    BP Readings from Last 3 Encounters:  04/28/22 108/66  03/14/22 131/68  03/13/22 (!) 143/69     Physical Exam     Assessment & Plan:  There are no diagnoses linked to this encounter.      No follow-ups on file.  This note was prepared with assistance of Conservation officer, historic buildings. Occasional wrong-word or sound-a-like substitutions may have occurred due to the inherent limitations of voice recognition software.  Time Spent: *** minutes of total time was spent on the date of the encounter performing the following actions: chart review prior to seeing the patient, obtaining history, performing a medically necessary exam, counseling on the treatment plan, placing orders, and documenting  in our EHR.       Jatziry Wechter M Sherley Mckenney, PA-C

## 2022-04-28 NOTE — Telephone Encounter (Signed)
Patient states PCP was prescribing an antibiotic at to day's office visit 04/28/22 ( Bactrim was mentioned)  Patient states Pharmacy has not received a RX

## 2022-05-01 ENCOUNTER — Other Ambulatory Visit: Payer: Self-pay | Admitting: Physician Assistant

## 2022-05-01 LAB — URINE CULTURE
MICRO NUMBER:: 14141649
SPECIMEN QUALITY:: ADEQUATE

## 2022-05-01 MED ORDER — NITROFURANTOIN MONOHYD MACRO 100 MG PO CAPS: 100.0000 mg | ORAL_CAPSULE | Freq: Two times a day (BID) | ORAL | 0 refills | Status: AC

## 2022-05-01 NOTE — Telephone Encounter (Signed)
Please advise if you would like me to send in Bactrim for patient since urine culture has came back.

## 2022-05-02 ENCOUNTER — Ambulatory Visit: Payer: Medicare Other | Admitting: Physical Therapy

## 2022-05-03 ENCOUNTER — Ambulatory Visit: Payer: Medicare Other | Attending: Psychiatry | Admitting: Physical Therapy

## 2022-05-03 DIAGNOSIS — M542 Cervicalgia: Secondary | ICD-10-CM | POA: Diagnosis not present

## 2022-05-03 DIAGNOSIS — M6281 Muscle weakness (generalized): Secondary | ICD-10-CM

## 2022-05-03 NOTE — Therapy (Signed)
OUTPATIENT PHYSICAL THERAPY CERVICAL TREATMENT   Patient Name: Tina Pena MRN: [id number] DOB:Aug 13, 1955, 66 y.o., female Today's Date: 05/03/2022   PT End of Session - 05/03/22 1357     Visit Number 6    Number of Visits 9    Date for PT Re-Evaluation 05/23/22    Authorization Type Medicare    PT Start Time 1355    PT Stop Time 1435    PT Time Calculation (min) 40 min    Activity Tolerance Patient tolerated treatment well    Behavior During Therapy WFL for tasks assessed/performed                Past Medical History:  Diagnosis Date   Acid reflux    Allergy    Anal fissure    Arthritis    Barrett's esophagus    Bipolar disorder ([place])    Cervical herniated disc    Chronic headaches    Chronic pain    Depression    Dermatomyositis ([place])    GERD (gastroesophageal reflux disease)    H/O vitamin D deficiency    Hypercholesterolemia    IBS (irritable bowel syndrome)    Ocular migraine    Osteoporosis    Past Surgical History:  Procedure Laterality Date   APPENDECTOMY     GALLBLADDER SURGERY  2001   HERNIA REPAIR     RETINAL DETACHMENT SURGERY Right 11/29/2021   TONSILLECTOMY  3754   toupetfundoplicatio  3606   TUBAL LIGATION     Patient Active Problem List   Diagnosis Date Noted   Chronic constipation 02/21/2022   Gastroesophageal reflux disease 02/21/2022   Dysphagia 02/21/2022   Anal fissure 02/21/2022   Neck pain, chronic 10/20/2021   Cervical radiculopathy 10/20/2021   Calcific tendinitis of right shoulder 10/24/2018   MDD (major depressive disorder), recurrent episode, moderate ([place]) 01/12/2018   Vitamin D deficiency 05/11/2017   Alopecia 12/12/2016   Atrophy of vagina 12/12/2016   Depression 12/12/2016   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   Diverticulosis 12/12/2016   Fibromyalgia 12/12/2016   Gastritis 12/12/2016   H/O multiple allergies 12/12/2016   Herpes 12/12/2016   Iron deficiency 77/08/4033   Lichen simplex chronicus 12/12/2016    Migraines 12/12/2016   Mollusca contagiosa 12/12/2016    PCP: Allwardt, Randa Evens, PA-C  REFERRING PROVIDER: Genia Harold, MD  REFERRING DIAG: M54.2 (ICD-10-CM) - Cervicalgia   THERAPY DIAG:  Cervicalgia  Muscle weakness (generalized)  Rationale for Evaluation and Treatment Rehabilitation  ONSET DATE: 03/13/2022 (date of referral)  SUBJECTIVE:  SUBJECTIVE STATEMENT: Pt reports she has a 5/10 headache today, no migraine. Pt reports she did have a terrible migraine after her last PT visit, not sure if it was related. Pt reports she has started a migraine medication again and it may cause dizziness which is why she stopped taking it before.  PERTINENT HISTORY:  PMH: Arthritis, chronic migraines, depression, chronic neck pain, osteoporosis  PAIN:  Are you having pain? Yes: NPRS scale: 6/10 Pain location: Both sides of the neck Pain description: Sharp and sore. Aggravating factors: Working at a computer, driving for a long time, yard work. Relieving factors: Ice, tramadol   PRECAUTIONS: None  PATIENT GOALS Wants to not have any more migraine, do yardwork without stopping due to something hurting.   OBJECTIVE:   TODAY'S TREATMENT:  MANUAL THERAPY Suboccipital release 4 x 30-45 sec each Cervical traction 4 x 30 sec each L/R passive UT stretch 4 x 30 sec each direction  THER ACT NDI: 14/50  CERVICAL ROM:      Active ROM AROM (deg) eval AROM (deg) 05/03/22  Flexion 33 (incr pain) 35  Extension 42 (did not hurt as bad) 40  Right lateral flexion 33 (incr pain) 25  Left lateral flexion 37 (incr pain) 25  Right rotation 50 (feels pressure) 60  Left rotation 52 (feels pressure) 65 (feels tight)    THERAPEUTIC EXERCISE Supine chin tucks x 10 reps  Trigger Point  Dry-Needling  Treatment instructions: Expect mild to moderate muscle soreness. S/S of pneumothorax if dry needled over a lung field, and to seek immediate medical attention should they occur. Patient verbalized understanding of these instructions and education.  Patient Consent Given: Yes Education handout provided: Previously provided Muscles treated: L and R suboccipitals Treatment response/outcome: decreased pain per pt report; decreased palpable trigger points  Trigger Point Dry Needling  What is Trigger Point Dry Needling (DN)? DN is a physical therapy technique used to treat muscle pain and dysfunction. Specifically, DN helps deactivate muscle trigger points (muscle knots).  A thin filiform needle is used to penetrate the skin and stimulate the underlying trigger point. The goal is for a local twitch response (LTR) to occur and for the trigger point to relax. No medication of any kind is injected during the procedure.   What Does Trigger Point Dry Needling Feel Like?  The procedure feels different for each individual patient. Some patients report that they do not actually feel the needle enter the skin and overall the process is not painful. Very mild bleeding may occur. However, many patients feel a deep cramping in the muscle in which the needle was inserted. This is the local twitch response.   How Will I feel after the treatment? Soreness is normal, and the onset of soreness may not occur for a few hours. Typically this soreness does not last longer than two days.  Bruising is uncommon, however; ice can be used to decrease any possible bruising.  In rare cases feeling tired or nauseous after the treatment is normal. In addition, your symptoms may get worse before they get better, this period will typically not last longer than 24 hours.   What Can I do After My Treatment? Increase your hydration by drinking more water for the next 24 hours. You may place ice or heat on the areas  treated that have become sore, however, do not use heat on inflamed or bruised areas. Heat often brings more relief post needling. You can continue your regular activities, but vigorous activity  is not recommended initially after the treatment for 24 hours. DN is best combined with other physical therapy such as strengthening, stretching, and other therapies.     PATIENT EDUCATION:  Education details: Continue with HEP, TPDN as noted above, PT POC Person educated: Patient Education method: Explanation and Demonstration Education comprehension: verbalized understanding and returned demonstration   HOME EXERCISE PROGRAM: Access Code: [email or web address] URL: [email or web address] Date: 04/13/2022 Prepared by: Janann August  Exercises - Seated Upper Trapezius Stretch  - 1-2 x daily - 5 x weekly - 3 sets - 30 hold - Gentle Levator Scapulae Stretch  - 1-2 x daily - 5 x weekly - 3 sets - 30 hold - Supine Thoracic Mobilization Towel Roll Vertical with Arm Stretch  - 1 x daily - 7 x weekly - 1 sets - 10 reps - 15-30 sec hold - Supine Suboccipital Release with Tennis Balls  - 1 x daily - 7 x weekly - 1 sets - 1 reps - 5 min hold - Seated Shoulder Row with Anchored Resistance  - 1 x daily - 7 x weekly - 3 sets - 15 reps - Child's Pose Stretch  - 1 x daily - 7 x weekly - 1 sets - 5 reps - 30 sec hold - Child's Pose with Thread the Needle  - 1 x daily - 7 x weekly - 1 sets - 3 reps - 30 sec hold - Cat Cow  - 1 x daily - 7 x weekly - 1 sets - 10 reps - Prone Single Arm Shoulder Y  - 1 x daily - 7 x weekly - 1 sets - 10 reps - Prone Scapular Retraction Arms at Side  - 1 x daily - 7 x weekly - 2 sets - 10 reps  ASSESSMENT:  CLINICAL IMPRESSION: Emphasis of skilled PT session on reassessing LTG due to goal assessment date and performing TPDN and manual therapy for pain relief. Pt has met 4/5 goals due to being independent with her HEP, improving her NDI score from 20/50 to 14/50 demonstrating  decreased disability level, being able to return to doing yardwork for multiple hours (in a modified fashion), and increasing her cervical rotation ROM to 60 degrees to the R and 65 degrees to the left. Pt continues to exhibit decreased cervical flexion AROM, ongoing migraines, and ongoing neck pain and tightness though she is significantly improved from initial evaluation. Pt continues to benefit from skilled therapy services due to ongoing neck pain/migraines limiting her ability to function on a daily basis. Continue POC.  OBJECTIVE IMPAIRMENTS decreased activity tolerance, decreased mobility, decreased ROM, decreased strength, dizziness, hypomobility, increased fascial restrictions, increased muscle spasms, impaired flexibility, impaired sensation, postural dysfunction, and pain.   ACTIVITY LIMITATIONS  pt able to perform, just has pain.  PARTICIPATION LIMITATIONS: community activity and yard work  PERSONAL FACTORS Past/current experiences, Time since onset of injury/illness/exacerbation, and 3+ comorbidities: Arthritis, chronic migraines, depression, chronic neck pain, osteoporosis  are also affecting patient's functional outcome.   REHAB POTENTIAL: Good  CLINICAL DECISION MAKING: Stable/uncomplicated  EVALUATION COMPLEXITY: Low   GOALS: Goals reviewed with patient? Yes  SHORT TERM GOALS: ALL STGS = LTGS  LONG TERM GOALS: Target date: 04/21/2022  Pt will be independent with final HEP in order to build upon functional gains made in therapy. Baseline:  Goal status: MET  2.  Pt will decr NDI score to a 14/50 or less in order to demo decr disability with neck pain.  Baseline: 20/50, 14/50 (11/8) Goal status:  MET  3.  Pt will verbalize being able to do yardwork continuously for at least 30 minutes in order to demo improved functional mobility.   Baseline: pt currently has to stop when doing yardwork due to pain; can do for at least a couple hours (11/8) Goal status: MET  4.  Pt  will improve cervical flexion AROM to at least 45 degrees in order to demo improved functional mobility.  Baseline: 33 decr with incr pain, 35 degrees (11/8)  Goal status: IN PROGRESS   5.  Pt will improve R and L cervical AROM to at least 60 deg in order to demo improved AROM for driving  Baseline: R rotation:50 deg, L rotation: 52 degrees; R rotation 60 deg, L rotation 65 degrees Goal status: MET   NEW LONG TERM GOAL: Target date: 05/10/22  1.  Pt will improve cervical flexion AROM to at least 45 degrees in order to demo improved functional mobility.  Baseline: 33 decr with incr pain, 35 degrees (11/8) Goal status: PROGRESSING   PLAN: PT FREQUENCY: 1-2x/week  PT DURATION: 8 weeks - do not anticipate pt needing all POC  PLANNED INTERVENTIONS: Therapeutic exercises, Therapeutic activity, Neuromuscular re-education, Balance training, Gait training, Patient/Family education, Self Care, Joint mobilization, Vestibular training, Dry Needling, Spinal mobilization, Cryotherapy, Moist heat, Traction, Manual therapy, and Re-evaluation  PLAN FOR NEXT SESSION: dry needling, Add to HEP for cervical ROM/stretching, and postural strengthening. Exercises for thoracic mobility, reassess cervical flexion, D/c from PT?    Excell Seltzer, PT, DPT, CSRS  05/03/2022, 2:37 PM

## 2022-05-09 ENCOUNTER — Ambulatory Visit: Payer: Medicare Other | Admitting: Physical Therapy

## 2022-05-10 ENCOUNTER — Ambulatory Visit: Payer: Medicare Other | Admitting: Physical Therapy

## 2022-05-10 DIAGNOSIS — M542 Cervicalgia: Secondary | ICD-10-CM

## 2022-05-10 DIAGNOSIS — M6281 Muscle weakness (generalized): Secondary | ICD-10-CM

## 2022-05-10 NOTE — Therapy (Signed)
OUTPATIENT PHYSICAL THERAPY CERVICAL TREATMENT-DISCHARGE NOTE   Patient Name: Tina Pena MRN: [id number] DOB:01/03/1956, 66 y.o., female Today's Date: 05/10/2022  PHYSICAL THERAPY DISCHARGE SUMMARY  Visits from Start of Care: 7  Current functional level related to goals / functional outcomes: Independent   Remaining deficits: Ongoing headaches and neck pain, significantly improved from initial eval   Education / Equipment: Handout for HEP   Patient agrees to discharge. Patient goals were met. Patient is being discharged due to meeting the stated rehab goals.    PT End of Session - 05/10/22 1623     Visit Number 7    Number of Visits 9    Date for PT Re-Evaluation 05/23/22    Authorization Type Medicare    PT Start Time 1621   pt late   PT Stop Time 1638   pt d/c, requesting to leave early   PT Time Calculation (min) 17 min    Activity Tolerance Patient tolerated treatment well    Behavior During Therapy WFL for tasks assessed/performed                 Past Medical History:  Diagnosis Date   Acid reflux    Allergy    Anal fissure    Arthritis    Barrett's esophagus    Bipolar disorder (HCC)    Cervical herniated disc    Chronic headaches    Chronic pain    Depression    Dermatomyositis (HCC)    GERD (gastroesophageal reflux disease)    H/O vitamin D deficiency    Hypercholesterolemia    IBS (irritable bowel syndrome)    Ocular migraine    Osteoporosis    Past Surgical History:  Procedure Laterality Date   APPENDECTOMY     GALLBLADDER SURGERY  2001   HERNIA REPAIR     RETINAL DETACHMENT SURGERY Right 11/29/2021   TONSILLECTOMY  1981   toupetfundoplicatio  2000   TUBAL LIGATION     Patient Active Problem List   Diagnosis Date Noted   Chronic constipation 02/21/2022   Gastroesophageal reflux disease 02/21/2022   Dysphagia 02/21/2022   Anal fissure 02/21/2022   Neck pain, chronic 10/20/2021   Cervical radiculopathy 10/20/2021    Calcific tendinitis of right shoulder 10/24/2018   MDD (major depressive disorder), recurrent episode, moderate (HCC) 01/12/2018   Vitamin D deficiency 05/11/2017   Alopecia 12/12/2016   Atrophy of vagina 12/12/2016   Depression 12/12/2016   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   Diverticulosis 12/12/2016   Fibromyalgia 12/12/2016   Gastritis 12/12/2016   H/O multiple allergies 12/12/2016   Herpes 12/12/2016   Iron deficiency 12/12/2016   Lichen simplex chronicus 12/12/2016   Migraines 12/12/2016   Mollusca contagiosa 12/12/2016    PCP: Allwardt, Alyssa M, PA-C  REFERRING PROVIDER: Chima, Jennifer, MD  REFERRING DIAG: M54.2 (ICD-10-CM) - Cervicalgia   THERAPY DIAG:  Cervicalgia  Muscle weakness (generalized)  Rationale for Evaluation and Treatment Rehabilitation  ONSET DATE: 03/13/2022 (date of referral)  SUBJECTIVE:                                                                                                                                                                                                           SUBJECTIVE STATEMENT: Pt reports 4/10 pain currently, reports this is much improved from where she normally is. Pt had some pain after bowling on Monday but it got better after doing stretches/HEP.  PERTINENT HISTORY:  PMH: Arthritis, chronic migraines, depression, chronic neck pain, osteoporosis  PAIN:  Are you having pain? Yes: NPRS scale: 4/10 Pain location: Both sides of the neck Pain description: Sharp and sore. Aggravating factors: Working at a computer, driving for a long time, yard work. Relieving factors: Ice, tramadol   PRECAUTIONS: None  PATIENT GOALS Wants to not have any more migraine, do yardwork without stopping due to something hurting.   OBJECTIVE:   TODAY'S TREATMENT:  THER ACT Reassessed cervical ROM for LTG assessment:  CERVICAL ROM:      Active ROM AROM (deg) eval AROM (deg) 05/03/22 AROM (deg) 05/10/22  Flexion 33 (incr pain) 35 50   Extension 42 (did not hurt as bad) 40   Right lateral flexion 33 (incr pain) 25   Left lateral flexion 37 (incr pain) 25   Right rotation 50 (feels pressure) 60   Left rotation 52 (feels pressure) 65 (feels tight)     Trigger Point Dry-Needling  Treatment instructions: Expect mild to moderate muscle soreness. S/S of pneumothorax if dry needled over a lung field, and to seek immediate medical attention should they occur. Patient verbalized understanding of these instructions and education.  Patient Consent Given: Yes Education handout provided: Previously provided Muscles treated: L and R suboccipitals Treatment response/outcome: decreased pain per pt report; decreased palpable trigger points  Trigger Point Dry Needling  What is Trigger Point Dry Needling (DN)? DN is a physical therapy technique used to treat muscle pain and dysfunction. Specifically, DN helps deactivate muscle trigger points (muscle knots).  A thin filiform needle is used to penetrate the skin and stimulate the underlying trigger point. The goal is for a local twitch response (LTR) to occur and for the trigger point to relax. No medication of any kind is injected during the procedure.   What Does Trigger Point Dry Needling Feel Like?  The procedure feels different for each individual patient. Some patients report that they do not actually feel the needle enter the skin and overall the process is not painful. Very mild bleeding may occur. However, many patients feel a deep cramping in the muscle in which the needle was inserted. This is the local twitch response.   How Will I feel after the treatment? Soreness is normal, and the onset of soreness may not occur for a few hours. Typically this soreness does not last longer than two days.  Bruising is uncommon, however; ice can be used to decrease any possible bruising.  In rare cases feeling tired or nauseous after the treatment is normal. In addition, your symptoms may get  worse before they get better, this period will typically not last longer than 24 hours.   What Can I do After My Treatment? Increase your hydration by drinking more water for the next 24 hours. You may place ice or heat on the areas treated that have become sore, however, do not use heat on inflamed or bruised areas. Heat often brings more relief post needling. You can continue your regular activities, but vigorous activity is not recommended initially after the treatment for 24 hours. DN is best combined with other physical therapy such as strengthening, stretching, and other therapies.     PATIENT EDUCATION:  Education details: Continue with HEP, TPDN as noted above, d/c from PT  services Person educated: Patient Education method: Explanation and Demonstration Education comprehension: verbalized understanding and returned demonstration   HOME EXERCISE PROGRAM: Access Code: [email or web address] URL: [email or web address] Date: 04/13/2022 Prepared by: Chloe Gilgannon  Exercises - Seated Upper Trapezius Stretch  - 1-2 x daily - 5 x weekly - 3 sets - 30 hold - Gentle Levator Scapulae Stretch  - 1-2 x daily - 5 x weekly - 3 sets - 30 hold - Supine Thoracic Mobilization Towel Roll Vertical with Arm Stretch  - 1 x daily - 7 x weekly - 1 sets - 10 reps - 15-30 sec hold - Supine Suboccipital Release with Tennis Balls  - 1 x daily - 7 x weekly - 1 sets - 1 reps - 5 min hold - Seated Shoulder Row with Anchored Resistance  - 1 x daily - 7 x weekly - 3 sets - 15 reps - Child's Pose Stretch  - 1 x daily - 7 x weekly - 1 sets - 5 reps - 30 sec hold - Child's Pose with Thread the Needle  - 1 x daily - 7 x weekly - 1 sets - 3 reps - 30 sec hold - Cat Cow  - 1 x daily - 7 x weekly - 1 sets - 10 reps - Prone Single Arm Shoulder Y  - 1 x daily - 7 x weekly - 1 sets - 10 reps - Prone Scapular Retraction Arms at Side  - 1 x daily - 7 x weekly - 2 sets - 10 reps  ASSESSMENT:  CLINICAL  IMPRESSION: Emphasis of skilled PT session on reassessing LTG and performing TPDN for pain relief. Pt has met 1/1 LTG due to improved cervical flexion from 33 degrees initially to 50 degrees this date. Pt is independent with her HEP and agreeable to d/c from PT services at this time.  OBJECTIVE IMPAIRMENTS decreased activity tolerance, decreased mobility, decreased ROM, decreased strength, dizziness, hypomobility, increased fascial restrictions, increased muscle spasms, impaired flexibility, impaired sensation, postural dysfunction, and pain.   ACTIVITY LIMITATIONS  pt able to perform, just has pain.  PARTICIPATION LIMITATIONS: community activity and yard work  PERSONAL FACTORS Past/current experiences, Time since onset of injury/illness/exacerbation, and 3+ comorbidities: Arthritis, chronic migraines, depression, chronic neck pain, osteoporosis  are also affecting patient's functional outcome.   REHAB POTENTIAL: Good  CLINICAL DECISION MAKING: Stable/uncomplicated  EVALUATION COMPLEXITY: Low   GOALS: Goals reviewed with patient? Yes  SHORT TERM GOALS: ALL STGS = LTGS  LONG TERM GOALS: Target date: 04/21/2022  Pt will be independent with final HEP in order to build upon functional gains made in therapy. Baseline:  Goal status: MET  2.  Pt will decr NDI score to a 14/50 or less in order to demo decr disability with neck pain.  Baseline: 20/50, 14/50 (11/8) Goal status: MET  3.  Pt will verbalize being able to do yardwork continuously for at least 30 minutes in order to demo improved functional mobility.   Baseline: pt currently has to stop when doing yardwork due to pain; can do for at least a couple hours (11/8) Goal status: MET  4.  Pt will improve cervical flexion AROM to at least 45 degrees in order to demo improved functional mobility.  Baseline: 33 decr with incr pain, 35 degrees (11/8)  Goal status: IN PROGRESS   5.  Pt will improve R and L cervical AROM to at least 60  deg in order to demo improved AROM for driving  Baseline: R rotation:50 deg, L   rotation: 52 degrees; R rotation 60 deg, L rotation 65 degrees Goal status: MET   NEW LONG TERM GOAL: Target date: 05/10/22  1.  Pt will improve cervical flexion AROM to at least 45 degrees in order to demo improved functional mobility.  Baseline: 33 decr with incr pain, 35 degrees (11/8), 50 degrees (11/15) Goal status: MET      Taylor Turkalo, PT, DPT, CSRS  05/10/2022, 4:42 PM 

## 2022-05-16 ENCOUNTER — Ambulatory Visit: Payer: Medicare Other | Admitting: Physical Therapy

## 2022-05-22 ENCOUNTER — Other Ambulatory Visit: Payer: Self-pay | Admitting: Physician Assistant

## 2022-05-22 MED ORDER — TRAMADOL HCL 50 MG PO TABS: ORAL_TABLET | ORAL | 0 refills | Status: DC

## 2022-05-22 NOTE — Telephone Encounter (Signed)
..   Encourage patient to contact the pharmacy for refills or they can request refills through [facility]  LAST APPOINTMENT DATE: 04/28/22  NEXT APPOINTMENT DATE: 07/24/22  MEDICATION: traMADol (ULTRAM) 50 MG tablet   Is the patient out of medication?   PHARMACY:  [id number] [place], [place] - [address] Phone: [phone]  Fax: [phone]      Let patient know to contact pharmacy at the end of the day to make sure medication is ready.  Please notify patient to allow 48-72 hours to process

## 2022-05-30 ENCOUNTER — Encounter: Payer: Self-pay | Admitting: Physician Assistant

## 2022-05-30 ENCOUNTER — Ambulatory Visit (INDEPENDENT_AMBULATORY_CARE_PROVIDER_SITE_OTHER): Payer: Medicare Other | Admitting: Physician Assistant

## 2022-05-30 VITALS — BP 136/86 | HR 90 | Temp 97.3°F | Ht 68.0 in | Wt 157.0 lb

## 2022-05-30 DIAGNOSIS — R300 Dysuria: Secondary | ICD-10-CM | POA: Diagnosis not present

## 2022-05-30 DIAGNOSIS — R350 Frequency of micturition: Secondary | ICD-10-CM | POA: Diagnosis not present

## 2022-05-30 DIAGNOSIS — R1030 Lower abdominal pain, unspecified: Secondary | ICD-10-CM | POA: Diagnosis not present

## 2022-05-30 LAB — COMPREHENSIVE METABOLIC PANEL
ALT: 23 U/L (ref 0–35)
AST: 22 U/L (ref 0–37)
Albumin: 4.5 g/dL (ref 3.5–5.2)
Alkaline Phosphatase: 98 U/L (ref 39–117)
BUN: 20 mg/dL (ref 6–23)
CO2: 31 mEq/L (ref 19–32)
Calcium: 9.5 mg/dL (ref 8.4–10.5)
Chloride: 99 mEq/L (ref 96–112)
Creatinine, Ser: 0.87 mg/dL (ref 0.40–1.20)
GFR: 69.24 mL/min (ref >60.00)
Glucose, Bld: 101 mg/dL — ABNORMAL HIGH (ref 70–99)
Potassium: 4.7 mEq/L (ref 3.5–5.1)
Sodium: 138 mEq/L (ref 135–145)
Total Bilirubin: 0.4 mg/dL (ref 0.2–1.2)
Total Protein: 7.2 g/dL (ref 6.0–8.3)

## 2022-05-30 LAB — POCT URINALYSIS DIPSTICK
Bilirubin, UA: POSITIVE
Blood, UA: NEGATIVE
Glucose, UA: NEGATIVE
Ketones, UA: POSITIVE
Nitrite, UA: NEGATIVE
Protein, UA: POSITIVE — AB
Spec Grav, UA: >=1.03 — AB (ref 1.010–1.025)
Urobilinogen, UA: 0.2 E.U./dL
pH, UA: 5.5 (ref 5.0–8.0)

## 2022-05-30 LAB — CBC WITH DIFFERENTIAL/PLATELET
Basophils Absolute: 0.1 10*3/uL (ref 0.0–0.1)
Basophils Relative: 1 % (ref 0.0–3.0)
Eosinophils Absolute: 0.2 10*3/uL (ref 0.0–0.7)
Eosinophils Relative: 3.8 % (ref 0.0–5.0)
HCT: 39.3 % (ref 36.0–46.0)
Hemoglobin: 13.2 g/dL (ref 12.0–15.0)
Lymphocytes Relative: 16.3 % (ref 12.0–46.0)
Lymphs Abs: 1.1 10*3/uL (ref 0.7–4.0)
MCHC: 33.5 g/dL (ref 30.0–36.0)
MCV: 89.8 fl (ref 78.0–100.0)
Monocytes Absolute: 0.4 10*3/uL (ref 0.1–1.0)
Monocytes Relative: 6.6 % (ref 3.0–12.0)
Neutro Abs: 4.7 10*3/uL (ref 1.4–7.7)
Neutrophils Relative %: 72.3 % (ref 43.0–77.0)
Platelets: 254 10*3/uL (ref 150.0–400.0)
RBC: 4.38 Mil/uL (ref 3.87–5.11)
RDW: 13.4 % (ref 11.5–15.5)
WBC: 6.5 10*3/uL (ref 4.0–10.5)

## 2022-05-30 MED ORDER — SULFAMETHOXAZOLE-TRIMETHOPRIM 800-160 MG PO TABS: 1.0000 | ORAL_TABLET | Freq: Two times a day (BID) | ORAL | 0 refills | Status: AC

## 2022-05-30 NOTE — Progress Notes (Signed)
Subjective:    Patient ID: Tina Pena, female    DOB: July 05, 1955, 66 y.o.   MRN: [id number]  Chief Complaint  Patient presents with   Cystitis    Pt states she had positive OTC test for bladder infection; pt finally has appt with urology but not until Feb;    HPI Patient is in today for urinary frequency, recurrent UTI.  Recent appt 05/23/22 for yeast infection with her GYN. Diflucan, then symptoms resolved. Hx of lichen sclerosis.   Some lower abdominal cramping like period type pain. Now having more suprapubic pressure. Feels like something sitting on the bladder. Cloudy, odorous urine. No vaginal discharge or bleeding.   Past Medical History:  Diagnosis Date   Acid reflux    Allergy    Anal fissure    Arthritis    Barrett's esophagus    Bipolar disorder (HCC)    Cervical herniated disc    Chronic headaches    Chronic pain    Depression    Dermatomyositis (HCC)    GERD (gastroesophageal reflux disease)    H/O vitamin D deficiency    Hypercholesterolemia    IBS (irritable bowel syndrome)    Ocular migraine    Osteoporosis     Past Surgical History:  Procedure Laterality Date   APPENDECTOMY     GALLBLADDER SURGERY  2001   HERNIA REPAIR     RETINAL DETACHMENT SURGERY Right 11/29/2021   TONSILLECTOMY  1981   toupetfundoplicatio  2000   TUBAL LIGATION      Family History  Problem Relation Age of Onset   Cancer Father        breast   Bipolar disorder Father    Anxiety disorder Father    Depression Father    Paranoid behavior Father    Heart disease Father    Stroke Maternal Grandmother    Hypertension Maternal Grandmother    Bipolar disorder Maternal Grandmother    Dementia Maternal Grandmother    Hypertension Maternal Grandfather    Stroke Paternal Grandmother    Hypertension Paternal Grandmother    Bipolar disorder Paternal Grandmother    Dementia Paternal Grandmother    Hypertension Paternal Grandfather    Stomach cancer Neg Hx    Colon  cancer Neg Hx    Esophageal cancer Neg Hx     Social History   Tobacco Use   Smoking status: Never   Smokeless tobacco: Never  Vaping Use   Vaping Use: Never used  Substance Use Topics   Alcohol use: Not Currently    Alcohol/week: 2.0 standard drinks of alcohol    Types: 2 Glasses of wine per week    Comment: occasional glass of wine   Drug use: Never     Allergies  Allergen Reactions   Codeine Nausea And Vomiting    Other reaction(s): extreme stomach upset, GI Intolerance   Garlic Nausea And Vomiting   Other Nausea And Vomiting    Onions    Sertraline Rash    Other reaction(s): Confusion Other reaction(s): confusion, Other (See Comments) Other reaction(s): Confusion    Methotrexate     Other reaction(s): elevated LFT's Other reaction(s): elevated LFT's     Review of Systems NEGATIVE UNLESS OTHERWISE INDICATED IN HPI      Objective:     BP 136/86 (BP Location: Right Arm)   Pulse 90   Temp (!) 97.3 F (36.3 C) (Temporal)   Ht 5\' 8"  (1.727 m)   Wt 157 lb (71.2 kg)  SpO2 99%   BMI 23.87 kg/m   Wt Readings from Last 3 Encounters:  05/30/22 157 lb (71.2 kg)  04/28/22 156 lb 9.6 oz (71 kg)  03/14/22 160 lb (72.6 kg)    BP Readings from Last 3 Encounters:  05/30/22 136/86  04/28/22 108/66  03/14/22 131/68     Physical Exam Exam conducted with a chaperone present.  Constitutional:      Appearance: Normal appearance.  Cardiovascular:     Rate and Rhythm: Normal rate and regular rhythm.  Pulmonary:     Effort: Pulmonary effort is normal.     Breath sounds: Normal breath sounds.  Abdominal:     General: Abdomen is flat. Bowel sounds are normal. There is no distension.     Palpations: Abdomen is soft. There is no mass.     Tenderness: There is no abdominal tenderness. There is no right CVA tenderness, left CVA tenderness, guarding or rebound.     Hernia: No hernia is present.  Genitourinary:    Urethra: No prolapse or urethral swelling.      Vagina: Normal.     Cervix: Normal.     Uterus: Normal. No uterine prolapse.      Comments: Lichen sclerosis noted Thin atrophic vaginal lining Neurological:     Mental Status: She is alert.  Psychiatric:        Mood and Affect: Mood normal.        Assessment & Plan:  Dysuria -     POCT urinalysis dipstick -     Urine Culture -     CBC with Differential/Platelet -     Comprehensive metabolic panel  Frequent urination -     Urine Culture -     CBC with Differential/Platelet -     Comprehensive metabolic panel  Lower abdominal pain -     CBC with Differential/Platelet -     Comprehensive metabolic panel  Other orders -     Sulfamethoxazole-Trimethoprim; Take 1 tablet by mouth 2 (two) times daily for 5 days.  Dispense: 10 tablet; Refill: 7    66 year old female presenting for urinary symptoms feeling like another bladder infection today.  She has had recurrent UTI since June of this year.  Urinalysis is concerning for another UTI present.  Plan to start her on Bactrim at this time and culture urine, change antibiotic pending result if needed.  I am going to go ahead and check some basic labs to check overall cell count and renal function.  I did a pelvic exam today to make sure there were no concerns there and I was able to reassure her I did not see any signs of prolapse and her abdominal exam was negative.  She does have an appointment with urogynecology in early February.  Happy to try to help with symptoms and recurrence until that appointment.    Return if symptoms worsen or fail to improve.  This note was prepared with assistance of Conservation officer, historic buildings. Occasional wrong-word or sound-a-like substitutions may have occurred due to the inherent limitations of voice recognition software.      Tanetta Fuhriman M Obi Scrima, PA-C

## 2022-06-02 ENCOUNTER — Ambulatory Visit (INDEPENDENT_AMBULATORY_CARE_PROVIDER_SITE_OTHER): Payer: Medicare Other

## 2022-06-02 DIAGNOSIS — Z0000 Encounter for general adult medical examination without abnormal findings: Secondary | ICD-10-CM | POA: Diagnosis not present

## 2022-06-02 LAB — URINE CULTURE
MICRO NUMBER:: 14272381
SPECIMEN QUALITY:: ADEQUATE

## 2022-06-02 NOTE — Progress Notes (Signed)
I connected with  Tina Pena on 06/02/22 by a audio enabled telemedicine application and verified that I am speaking with the correct person using two identifiers.  Patient Location: Home  Provider Location: Office/Clinic  I discussed the limitations of evaluation and management by telemedicine. The patient expressed understanding and agreed to proceed.   Subjective:   Tina Pena is a 66 y.o. female who presents for Medicare Annual (Subsequent) preventive examination.  Review of Systems     Cardiac Risk Factors include: advanced age (>30men, >39 women)     Objective:    There were no vitals filed for this visit. There is no height or weight on file to calculate BMI.     06/02/2022   12:40 PM 03/24/2022   12:29 PM 01/17/2018    2:51 PM  Advanced Directives  Does Patient Have a Medical Advance Directive? Yes Yes   Type of Estate agent of [place];Living will    Does patient want to make changes to medical advance directive?     Copy of Healthcare Power of Attorney in Chart? No - copy requested       Information is confidential and restricted. Go to Review Flowsheets to unlock data.    Current Medications (verified) Outpatient Encounter Medications as of 06/02/2022  Medication Sig   hydroxychloroquine (PLAQUENIL) 200 MG tablet Take 200 mg by mouth 2 (two) times daily. 1 tab by mouth in AM, 1 tab by mouth in PM   inFLIXimab (REMICADE) 100 MG injection 7mg /kg   loratadine-pseudoephedrine (CLARITIN-D 24 HOUR) 10-240 MG 24 hr tablet 1 tablet as needed Orally Once a day for 30 day(s)   LORazepam (ATIVAN) 0.5 MG tablet Take 0.5 mg by mouth daily as needed.   metoCLOPramide (REGLAN) 10 MG tablet Take 1 tablet (10 mg total) by mouth every 8 (eight) hours as needed for up to 20 doses for nausea.   Multiple Vitamins-Minerals (MULTI ADULT GUMMIES PO) Take by mouth.   naratriptan (AMERGE) 2.5 MG tablet Take 1 tablet (2.5 mg total) by mouth as needed for  migraine. Take one (1) tablet at onset of headache; if returns or does not resolve, may repeat after 4 hours; do not exceed five (5) mg in 24 hours.   pantoprazole (PROTONIX) 40 MG tablet Protonix 40mg  po BID for 8 weeks.   sucralfate (CARAFATE) 1 GM/10ML suspension TAKE 10 MLS (1 G TOTAL) BY MOUTH 4 (FOUR) TIMES DAILY - WITH MEALS AND AT BEDTIME.   sulfamethoxazole-trimethoprim (BACTRIM DS) 800-160 MG tablet Take 1 tablet by mouth 2 (two) times daily for 5 days.   traMADol (ULTRAM) 50 MG tablet TAKE 1 TABLET BY MOUTH EVERY 6 (SIX) HOURS AS NEEDED FOR MODERATE PAIN (6-8 PER DAY DEPENDING ON PAIN).   valACYclovir (VALTREX) 1000 MG tablet Take by mouth.   Vilazodone HCl (VIIBRYD) 40 MG TABS Take 40 mg by mouth daily.   AMBULATORY NON FORMULARY MEDICATION Medication Name: Diltiazem 2% w/ Lidocaine 5% - Using your index finger, apply a small amount of medication inside the rectum up to your first knuckle/joint three times daily x 6-8 weeks.   [DISCONTINUED] cephALEXin (KEFLEX) 250 MG capsule TAKE 1 CAPSULE (250 MG TOTAL) BY MOUTH DAILY IN THE AFTERNOON.   [DISCONTINUED] prednisoLONE acetate (PRED FORTE) 1 % ophthalmic suspension PLEASE SEE ATTACHED FOR DETAILED DIRECTIONS   [DISCONTINUED] propranolol (INDERAL) 20 MG tablet Take 1 tablet (20 mg total) by mouth 2 (two) times daily.   No facility-administered encounter medications on file as of 06/02/2022.  Allergies (verified) Codeine, Garlic, Other, Sertraline, and Methotrexate   History: Past Medical History:  Diagnosis Date   Acid reflux    Allergy    Anal fissure    Arthritis    Barrett's esophagus    Bipolar disorder (HCC)    Cervical herniated disc    Chronic headaches    Chronic pain    Depression    Dermatomyositis (HCC)    GERD (gastroesophageal reflux disease)    H/O vitamin D deficiency    Hypercholesterolemia    IBS (irritable bowel syndrome)    Ocular migraine    Osteoporosis    Past Surgical History:  Procedure  Laterality Date   APPENDECTOMY     GALLBLADDER SURGERY  2001   HERNIA REPAIR     RETINAL DETACHMENT SURGERY Right 11/29/2021   TONSILLECTOMY  1981   toupetfundoplicatio  2000   TUBAL LIGATION     Family History  Problem Relation Age of Onset   Cancer Father        breast   Bipolar disorder Father    Anxiety disorder Father    Depression Father    Paranoid behavior Father    Heart disease Father    Stroke Maternal Grandmother    Hypertension Maternal Grandmother    Bipolar disorder Maternal Grandmother    Dementia Maternal Grandmother    Hypertension Maternal Grandfather    Stroke Paternal Grandmother    Hypertension Paternal Grandmother    Bipolar disorder Paternal Grandmother    Dementia Paternal Grandmother    Hypertension Paternal Grandfather    Stomach cancer Neg Hx    Colon cancer Neg Hx    Esophageal cancer Neg Hx    Social History   Socioeconomic History   Marital status: Married    Spouse name: Not on file   Number of children: 2   Years of education: Not on file   Highest education level: Bachelor's degree (e.g., BA, AB, BS)  Occupational History   Occupation: retired  Tobacco Use   Smoking status: Never   Smokeless tobacco: Never  Vaping Use   Vaping Use: Never used  Substance and Sexual Activity   Alcohol use: Not Currently    Alcohol/week: 2.0 standard drinks of alcohol    Types: 2 Glasses of wine per week    Comment: occasional glass of wine   Drug use: Never   Sexual activity: Yes    Partners: Male    Birth control/protection: None  Other Topics Concern   Not on file  Social History Narrative   Lives with husband   Social Determinants of Health   Financial Resource Strain: Low Risk  (06/02/2022)   Overall Financial Resource Strain (CARDIA)    Difficulty of Paying Living Expenses: Not hard at all  Food Insecurity: No Food Insecurity (06/02/2022)   Hunger Vital Sign    Worried About Running Out of Food in the Last Year: Never true    Ran  Out of Food in the Last Year: Never true  Transportation Needs: No Transportation Needs (06/02/2022)   PRAPARE - Administrator, Civil Service (Medical): No    Lack of Transportation (Non-Medical): No  Physical Activity: Sufficiently Active (06/02/2022)   Exercise Vital Sign    Days of Exercise per Week: 5 days    Minutes of Exercise per Session: 60 min  Stress: No Stress Concern Present (06/02/2022)   [employer] of Occupational Health - Occupational Stress Questionnaire    Feeling of Stress :  Not at all  Social Connections: Moderately Integrated (06/02/2022)   Social Connection and Isolation Panel [NHANES]    Frequency of Communication with Friends and Family: Three times a week    Frequency of Social Gatherings with Friends and Family: More than three times a week    Attends Religious Services: Never    Database administrator or Organizations: Yes    Attends Engineer, structural: 1 to 4 times per year    Marital Status: Married    Tobacco Counseling Counseling given: Not Answered   Clinical Intake:  Pre-visit preparation completed: Yes  Pain : No/denies pain     BMI - recorded: 23.87 Nutritional Status: BMI of 19-24  Normal Nutritional Risks: None Diabetes: No  How often do you need to have someone help you when you read instructions, pamphlets, or other written materials from your doctor or pharmacy?: 1 - Never  Diabetic?no  Interpreter Needed?: No  Information entered by :: Lanier Ensign, LPN   Activities of Daily Living    06/02/2022   12:41 PM 05/29/2022    5:55 PM  In your present state of health, do you have any difficulty performing the following activities:  Hearing? 0 0  Vision? 0 0  Difficulty concentrating or making decisions? 0 0  Walking or climbing stairs? 0 0  Dressing or bathing? 0 0  Doing errands, shopping? 0 0  Preparing Food and eating ? N N  Using the Toilet? N N  In the past six months, have you accidently  leaked urine? N N  Do you have problems with loss of bowel control? N N  Managing your Medications? N N  Managing your Finances? N N  Housekeeping or managing your Housekeeping? N N    Patient Care Team: Allwardt, Crist Infante, PA-C as PCP - General (Physician Assistant)  Indicate any recent Medical Services you may have received from other than [facility] providers in the past year (date may be approximate).     Assessment:   This is a routine wellness examination for [place].  Hearing/Vision screen Hearing Screening - Comments:: Pt denies any hearing  Vision Screening - Comments:: Pt follows up with [facility] for annul eye exams   Dietary issues and exercise activities discussed: Current Exercise Habits: Home exercise routine, Type of exercise: walking;stretching, Time (Minutes): 60, Frequency (Times/Week): 5, Weekly Exercise (Minutes/Week): 300   Goals Addressed             This Visit's Progress    Patient Stated       Be more active and be healthier        Depression Screen    06/02/2022   12:39 PM 12/22/2021   11:20 AM 10/20/2021    9:41 AM 02/04/2018   10:58 AM 02/04/2018    9:00 AM 01/17/2018    9:00 AM  PHQ 2/9 Scores  PHQ - 2 Score 0 0 0     PHQ- 9 Score           Information is confidential and restricted. Go to Review Flowsheets to unlock data.    Fall Risk    06/02/2022   12:41 PM 05/29/2022    5:55 PM 05/29/2022    8:57 AM 12/22/2021   11:20 AM 10/20/2021    9:41 AM  Fall Risk   Falls in the past year? 0 0 0 0 0  Number falls in past yr: 0 0 0 0 0  Injury with Fall? 0 0  0 0 0  Risk for fall due to : Impaired vision   No Fall Risks No Fall Risks  Follow up Falls prevention discussed    Falls evaluation completed    FALL RISK PREVENTION PERTAINING TO THE HOME:  Any stairs in or around the home? Yes  If so, are there any without handrails? yes Home free of loose throw rugs in walkways, pet beds, electrical cords, etc? Yes  Adequate lighting in your  home to reduce risk of falls? Yes   ASSISTIVE DEVICES UTILIZED TO PREVENT FALLS:  Life alert? No  Use of a cane, walker or w/c? No  Grab bars in the bathroom? No  Shower chair or bench in shower? No  Elevated toilet seat or a handicapped toilet? No   TIMED UP AND GO:  Was the test performed? No .  Cognitive Function:        06/02/2022   12:42 PM  6CIT Screen  What Year? 0 points  What month? 0 points  What time? 0 points  Count back from 20 0 points  Months in reverse 0 points  Repeat phrase 0 points  Total Score 0 points    Immunizations Immunization History  Administered Date(s) Administered   Influenza-Unspecified 04/03/2022   Moderna Covid-19 Vaccine Bivalent Booster 61yrs & up 04/19/2021, 04/03/2022   Moderna Sars-Covid-2 Vaccination 09/12/2019, 10/14/2019, 02/26/2020   PNEUMOCOCCAL CONJUGATE-20 04/03/2022   Tdap 01/06/2022    TDAP status: Up to date  Flu Vaccine status: Up to date  Pneumococcal vaccine status: Up to date  Covid-19 vaccine status: Completed vaccines  Qualifies for Shingles Vaccine? Yes   Zostavax completed No   Shingrix Completed?: No.    Education has been provided regarding the importance of this vaccine. Patient has been advised to call insurance company to determine out of pocket expense if they have not yet received this vaccine. Advised may also receive vaccine at local pharmacy or Health Dept. Verbalized acceptance and understanding.  Screening Tests Health Maintenance  Topic Date Due   COVID-19 Vaccine (6 - 2023-24 season) 06/15/2022 (Originally 05/29/2022)   Zoster Vaccines- Shingrix (1 of 2) 07/29/2022 (Originally 08/10/2005)   MAMMOGRAM  12/09/2022 (Originally 08[birth year]10/1973)   Hepatitis C Screening  12/09/2022 (Originally 08/10/1973)   COLONOSCOPY (Pts 45-52yrs Insurance coverage will need to be confirmed)  01/21/2023 (Originally 08/10/2000)   Medicare Annual Wellness (AWV)  06/03/2023   DTaP/Tdap/Td (2 - Td or Tdap) 01/07/2032    Pneumonia Vaccine 27+ Years old  Completed   INFLUENZA VACCINE  Completed   DEXA SCAN  Completed   HPV VACCINES  Aged Out    Health Maintenance  There are no preventive care reminders to display for this patient.   Colorectal cancer screening: Type of screening: Colonoscopy. Completed 2014 per pt . Repeat every 10 years  Mammogram status: Completed 07/2021. Repeat every year  Bone Density status: Completed 07/27/21. Results reflect: Bone density results: NORMAL. Repeat every 2 years.   Additional Screening:  Hepatitis C Screening: does qualify;  Vision Screening: Recommended annual ophthalmology exams for early detection of glaucoma and other disorders of the eye. Is the patient up to date with their annual eye exam?  Yes  Who is the provider or what is the name of the office in which the patient attends annual eye exams? [facility]  If pt is not established with a provider, would they like to be referred to a provider to establish care? No .   Dental Screening: Recommended annual dental  exams for proper oral hygiene  Community Resource Referral / Chronic Care Management: CRR required this visit?  No   CCM required this visit?  No      Plan:     I have personally reviewed and noted the following in the patient's chart:   Medical and social history Use of alcohol, tobacco or illicit drugs  Current medications and supplements including opioid prescriptions. Patient is currently taking opioid prescriptions. Information provided to patient regarding non-opioid alternatives. Patient advised to discuss non-opioid treatment plan with their provider. Functional ability and status Nutritional status Physical activity Advanced directives List of other physicians Hospitalizations, surgeries, and ER visits in previous 12 months Vitals Screenings to include cognitive, depression, and falls Referrals and appointments  In addition, I have reviewed and discussed with patient certain  preventive protocols, quality metrics, and best practice recommendations. A written personalized care plan for preventive services as well as general preventive health recommendations were provided to patient.     Marzella Schleinina H Mckynlie Vanderslice, LPN   [id number]/811912/01/2022   Nurse Notes: none

## 2022-06-02 NOTE — Patient Instructions (Signed)
Tina Pena , Thank you for taking time to come for your Medicare Wellness Visit. I appreciate your ongoing commitment to your health goals. Please review the following plan we discussed and let me know if I can assist you in the future.   These are the goals we discussed:  Goals      Patient Stated     Be more active and be healthier         This is a list of the screening recommended for you and due dates:  Health Maintenance  Topic Date Due   COVID-19 Vaccine (6 - 2023-24 season) 06/15/2022*   Zoster (Shingles) Vaccine (1 of 2) 07/29/2022*   Mammogram  12/09/2022*   Hepatitis C Screening: USPSTF Recommendation to screen - Ages 18-79 yo.  12/09/2022*   Colon Cancer Screening  01/21/2023*   Medicare Annual Wellness Visit  06/03/2023   DTaP/Tdap/Td vaccine (2 - Td or Tdap) 01/07/2032   Pneumonia Vaccine  Completed   Flu Shot  Completed   DEXA scan (bone density measurement)  Completed   HPV Vaccine  Aged Out  *Topic was postponed. The date shown is not the original due date.    Advanced directives: Please bring a copy of your health care power of attorney and living will to the office at your convenience.  Conditions/risks identified: Be more active and be healthier   Next appointment: Follow up in one year for your annual wellness visit   Preventive Care 65 Years and Older, Female Preventive care refers to lifestyle choices and visits with your health care provider that can promote health and wellness. What does preventive care include? A yearly physical exam. This is also called an annual well check. Dental exams once or twice a year. Routine eye exams. Ask your health care provider how often you should have your eyes checked. Personal lifestyle choices, including: Daily care of your teeth and gums. Regular physical activity. Eating a healthy diet. Avoiding tobacco and drug use. Limiting alcohol use. Practicing safe sex. Taking low-dose aspirin every day. Taking  vitamin and mineral supplements as recommended by your health care provider. What happens during an annual well check? The services and screenings done by your health care provider during your annual well check will depend on your age, overall health, lifestyle risk factors, and family history of disease. Counseling  Your health care provider may ask you questions about your: Alcohol use. Tobacco use. Drug use. Emotional well-being. Home and relationship well-being. Sexual activity. Eating habits. History of falls. Memory and ability to understand (cognition). Work and work Astronomer. Reproductive health. Screening  You may have the following tests or measurements: Height, weight, and BMI. Blood pressure. Lipid and cholesterol levels. These may be checked every 5 years, or more frequently if you are over 43 years old. Skin check. Lung cancer screening. You may have this screening every year starting at age 65 if you have a 30-pack-year history of smoking and currently smoke or have quit within the past 15 years. Fecal occult blood test (FOBT) of the stool. You may have this test every year starting at age 49. Flexible sigmoidoscopy or colonoscopy. You may have a sigmoidoscopy every 5 years or a colonoscopy every 10 years starting at age 36. Hepatitis C blood test. Hepatitis B blood test. Sexually transmitted disease (STD) testing. Diabetes screening. This is done by checking your blood sugar (glucose) after you have not eaten for a while (fasting). You may have this done every 1-3 years. Bone  density scan. This is done to screen for osteoporosis. You may have this done starting at age 45. Mammogram. This may be done every 1-2 years. Talk to your health care provider about how often you should have regular mammograms. Talk with your health care provider about your test results, treatment options, and if necessary, the need for more tests. Vaccines  Your health care provider may  recommend certain vaccines, such as: Influenza vaccine. This is recommended every year. Tetanus, diphtheria, and acellular pertussis (Tdap, Td) vaccine. You may need a Td booster every 10 years. Zoster vaccine. You may need this after age 26. Pneumococcal 13-valent conjugate (PCV13) vaccine. One dose is recommended after age 73. Pneumococcal polysaccharide (PPSV23) vaccine. One dose is recommended after age 73. Talk to your health care provider about which screenings and vaccines you need and how often you need them. This information is not intended to replace advice given to you by your health care provider. Make sure you discuss any questions you have with your health care provider. Document Released: 07/09/2015 Document Revised: 03/01/2016 Document Reviewed: 04/13/2015 Elsevier Interactive Patient Education  2017 [employer] Prevention in the Home Falls can cause injuries. They can happen to people of all ages. There are many things you can do to make your home safe and to help prevent falls. What can I do on the outside of my home? Regularly fix the edges of walkways and driveways and fix any cracks. Remove anything that might make you trip as you walk through a door, such as a raised step or threshold. Trim any bushes or trees on the path to your home. Use bright outdoor lighting. Clear any walking paths of anything that might make someone trip, such as rocks or tools. Regularly check to see if handrails are loose or broken. Make sure that both sides of any steps have handrails. Any raised decks and porches should have guardrails on the edges. Have any leaves, snow, or ice cleared regularly. Use sand or salt on walking paths during winter. Clean up any spills in your garage right away. This includes oil or grease spills. What can I do in the bathroom? Use night lights. Install grab bars by the toilet and in the tub and shower. Do not use towel bars as grab bars. Use non-skid  mats or decals in the tub or shower. If you need to sit down in the shower, use a plastic, non-slip stool. Keep the floor dry. Clean up any water that spills on the floor as soon as it happens. Remove soap buildup in the tub or shower regularly. Attach bath mats securely with double-sided non-slip rug tape. Do not have throw rugs and other things on the floor that can make you trip. What can I do in the bedroom? Use night lights. Make sure that you have a light by your bed that is easy to reach. Do not use any sheets or blankets that are too big for your bed. They should not hang down onto the floor. Have a firm chair that has side arms. You can use this for support while you get dressed. Do not have throw rugs and other things on the floor that can make you trip. What can I do in the kitchen? Clean up any spills right away. Avoid walking on wet floors. Keep items that you use a lot in easy-to-reach places. If you need to reach something above you, use a strong step stool that has a grab bar. Keep  electrical cords out of the way. Do not use floor polish or wax that makes floors slippery. If you must use wax, use non-skid floor wax. Do not have throw rugs and other things on the floor that can make you trip. What can I do with my stairs? Do not leave any items on the stairs. Make sure that there are handrails on both sides of the stairs and use them. Fix handrails that are broken or loose. Make sure that handrails are as long as the stairways. Check any carpeting to make sure that it is firmly attached to the stairs. Fix any carpet that is loose or worn. Avoid having throw rugs at the top or bottom of the stairs. If you do have throw rugs, attach them to the floor with carpet tape. Make sure that you have a light switch at the top of the stairs and the bottom of the stairs. If you do not have them, ask someone to add them for you. What else can I do to help prevent falls? Wear shoes  that: Do not have high heels. Have rubber bottoms. Are comfortable and fit you well. Are closed at the toe. Do not wear sandals. If you use a stepladder: Make sure that it is fully opened. Do not climb a closed stepladder. Make sure that both sides of the stepladder are locked into place. Ask someone to hold it for you, if possible. Clearly mark and make sure that you can see: Any grab bars or handrails. First and last steps. Where the edge of each step is. Use tools that help you move around (mobility aids) if they are needed. These include: Canes. Walkers. Scooters. Crutches. Turn on the lights when you go into a dark area. Replace any light bulbs as soon as they burn out. Set up your furniture so you have a clear path. Avoid moving your furniture around. If any of your floors are uneven, fix them. If there are any pets around you, be aware of where they are. Review your medicines with your doctor. Some medicines can make you feel dizzy. This can increase your chance of falling. Ask your doctor what other things that you can do to help prevent falls. This information is not intended to replace advice given to you by your health care provider. Make sure you discuss any questions you have with your health care provider. Document Released: 04/08/2009 Document Revised: 11/18/2015 Document Reviewed: 07/17/2014 Elsevier Interactive Patient Education  2017 [employer].

## 2022-06-06 ENCOUNTER — Other Ambulatory Visit: Payer: Self-pay | Admitting: Physician Assistant

## 2022-06-06 DIAGNOSIS — K2970 Gastritis, unspecified, without bleeding: Secondary | ICD-10-CM

## 2022-06-22 NOTE — Progress Notes (Signed)
CC:  headaches  Follow-up Visit  Last visit: 03/13/2022 with Dr. Billey Gosling  Brief HPI: 66 year old female with a history of dermatomyositis who follows in clinic for migraines. [facility] 03/2021 was reportedly normal per patient.  At her last visit, she was started on propranolol for prevention and Nurtec for rescue.  Was referred to PT for cervicalgia.   Interval History:   Reports significant improvement of migraines and cervicalgia after working with PT and undergoing dry needling.  She questions a location where she can continue to receive dry needling.  Unable to tolerate propranolol due to night terrors, resolved after stopping.  Reports over the past month, she started to experience a mild headache, possibly beginning of migraine headache, for which she took naratriptan and completely resolved headache.  Has used naratriptan only 1 time over the past month.  Nurtec was not covered by insurance.     Headache days per month: 1 Headache free days per month: 29  Current Headache Regimen: Preventative: none Abortive: Naratriptan   Prior Therapies                                  Imitrex Maxalt 10 mg PRN - lack of efficacy Zomig (pill and dissolvable tablet) Naratriptan Nurtec - helped, not covered by insurance Reglan Gabapentin 300 mg QHS - side effects Topamax - side effects  Vilazodone 40 mg daily Propranolol  - side effects  Current Outpatient Medications on File Prior to Visit  Medication Sig Dispense Refill   AMBULATORY NON FORMULARY MEDICATION Medication Name: Diltiazem 2% w/ Lidocaine 5% - Using your index finger, apply a small amount of medication inside the rectum up to your first knuckle/joint three times daily x 6-8 weeks. 30 g 1   hydroxychloroquine (PLAQUENIL) 200 MG tablet Take 200 mg by mouth 2 (two) times daily. 1 tab by mouth in AM, 1 tab by mouth in PM     inFLIXimab (REMICADE) 100 MG injection 7mg /kg     loratadine-pseudoephedrine (CLARITIN-D 24 HOUR) 10-240  MG 24 hr tablet 1 tablet as needed Orally Once a day for 30 day(s)     LORazepam (ATIVAN) 0.5 MG tablet Take 0.5 mg by mouth daily as needed.     metoCLOPramide (REGLAN) 10 MG tablet Take 1 tablet (10 mg total) by mouth every 8 (eight) hours as needed for up to 20 doses for nausea. 10 tablet 0   Multiple Vitamins-Minerals (MULTI ADULT GUMMIES PO) Take by mouth.     naratriptan (AMERGE) 2.5 MG tablet Take 1 tablet (2.5 mg total) by mouth as needed for migraine. Take one (1) tablet at onset of headache; if returns or does not resolve, may repeat after 4 hours; do not exceed five (5) mg in 24 hours. 10 tablet 6   pantoprazole (PROTONIX) 40 MG tablet TAKE 1 TABLET BY MOUTH EVERY DAY 90 tablet 1   sucralfate (CARAFATE) 1 GM/10ML suspension TAKE 10 MLS (1 G TOTAL) BY MOUTH 4 (FOUR) TIMES DAILY - WITH MEALS AND AT BEDTIME. 420 mL 0   traMADol (ULTRAM) 50 MG tablet TAKE 1 TABLET BY MOUTH EVERY 6 (SIX) HOURS AS NEEDED FOR MODERATE PAIN (6-8 PER DAY DEPENDING ON PAIN). 90 tablet 0   valACYclovir (VALTREX) 1000 MG tablet Take by mouth.     Vilazodone HCl (VIIBRYD) 40 MG TABS Take 40 mg by mouth daily.     No current facility-administered medications on file prior  to visit.   Past Medical History:  Diagnosis Date   Acid reflux    Allergy    Anal fissure    Arthritis    Barrett's esophagus    Bipolar disorder (HCC)    Cervical herniated disc    Chronic headaches    Chronic pain    Depression    Dermatomyositis (HCC)    GERD (gastroesophageal reflux disease)    H/O vitamin D deficiency    Hypercholesterolemia    IBS (irritable bowel syndrome)    Ocular migraine    Osteoporosis    Past Surgical History:  Procedure Laterality Date   APPENDECTOMY     GALLBLADDER SURGERY  2001   HERNIA REPAIR     RETINAL DETACHMENT SURGERY Right 11/29/2021   TONSILLECTOMY  1981   toupetfundoplicatio  2000   TUBAL LIGATION        Physical Exam:   Vital Signs: BP 121/75   Pulse 79   Ht 5\' 8"  (1.727 m)    Wt 160 lb (72.6 kg)   BMI 24.33 kg/m  GENERAL:  well appearing, very pleasant middle-age Caucasian female, in no acute distress, alert  SKIN:  Color, texture, turgor normal. No rashes or lesions HEAD:  Normocephalic/atraumatic. RESP: normal respiratory effort MSK:  No gross joint deformities.   NEUROLOGICAL: Mental Status: Alert, oriented to person, place and time, Follows commands, and Speech fluent and appropriate. Cranial Nerves: PERRL, face symmetric, no dysarthria, hearing grossly intact Motor: moves all extremities equally Gait: normal-based.     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  Initial consult visit with Dr. 71 03/13/2022.  Reports significant improvement of migraines and cervicalgia after working with PT and dry needling. Only 1 mild headache over the past month which resolved with naratriptan.    PLAN: -Preventive: No indication for preventative med at this time -Rescue: Continue naratriptan PRN -Information for [facility] provided for patient for continued dry needling    Follow-up: 9 months   I spent 21 minutes of face-to-face and non-face-to-face time with patient.  This included previsit chart review, lab review, study review, order entry, electronic health record documentation, patient education and discussion regarding migraine headaches and treatment plan and answered all the questions to patient's satisfaction  03/15/2022, [facility]  [facility] [address] [place], [place]  Phone [phone] Fax [phone] Note: This document was prepared with digital dictation and possible smart phrase technology. Any transcriptional errors that result from this process are unintentional.

## 2022-06-27 ENCOUNTER — Encounter: Payer: Self-pay | Admitting: Adult Health

## 2022-06-27 ENCOUNTER — Ambulatory Visit (INDEPENDENT_AMBULATORY_CARE_PROVIDER_SITE_OTHER): Payer: Medicare Other | Admitting: Adult Health

## 2022-06-27 VITALS — BP 121/75 | HR 79 | Ht 68.0 in | Wt 160.0 lb

## 2022-06-27 DIAGNOSIS — G43109 Migraine with aura, not intractable, without status migrainosus: Secondary | ICD-10-CM | POA: Diagnosis not present

## 2022-06-27 DIAGNOSIS — M542 Cervicalgia: Secondary | ICD-10-CM

## 2022-06-27 NOTE — Patient Instructions (Addendum)
Your Plan:  Continue naratriptan as needed for rescue medication   Can look into [facility] for dry needling - you can visit their website to schedule a visit or call their office at [phone] Located at [address], [place], [place] [address]    Follow up in 9 months or call earlier if needed     Thank you for coming to see [place] at [facility]. I hope we have been able to provide you high quality care today.  You may receive a patient satisfaction survey over the next few weeks. We would appreciate your feedback and comments so that we may continue to improve ourselves and the health of our patients.

## 2022-06-28 ENCOUNTER — Encounter: Payer: Self-pay | Admitting: Internal Medicine

## 2022-06-29 ENCOUNTER — Other Ambulatory Visit: Payer: Self-pay | Admitting: Physician Assistant

## 2022-07-05 ENCOUNTER — Ambulatory Visit (INDEPENDENT_AMBULATORY_CARE_PROVIDER_SITE_OTHER): Payer: Medicare Other | Admitting: Physician Assistant

## 2022-07-05 ENCOUNTER — Encounter: Payer: Self-pay | Admitting: Physician Assistant

## 2022-07-05 VITALS — BP 138/84 | HR 95 | Temp 97.9°F | Ht 68.0 in | Wt 158.4 lb

## 2022-07-05 DIAGNOSIS — H9193 Unspecified hearing loss, bilateral: Secondary | ICD-10-CM | POA: Diagnosis not present

## 2022-07-05 DIAGNOSIS — H9312 Tinnitus, left ear: Secondary | ICD-10-CM

## 2022-07-05 DIAGNOSIS — G8929 Other chronic pain: Secondary | ICD-10-CM

## 2022-07-05 DIAGNOSIS — M5412 Radiculopathy, cervical region: Secondary | ICD-10-CM | POA: Diagnosis not present

## 2022-07-05 DIAGNOSIS — M542 Cervicalgia: Secondary | ICD-10-CM | POA: Diagnosis not present

## 2022-07-05 MED ORDER — TRAMADOL HCL 50 MG PO TABS: 50.0000 mg | ORAL_TABLET | Freq: Three times a day (TID) | ORAL | 0 refills | Status: AC | PRN

## 2022-07-05 NOTE — Assessment & Plan Note (Signed)
Worse recently. Of note, on plaquenil for the last 10 years. Pt will reach out to her rheumatologist in this regard. Referral sent to ENT as well.

## 2022-07-05 NOTE — Assessment & Plan Note (Signed)
PDMP reviewed today, no red flags, filling appropriately.  Doing well with Tramadol 50 mg TID prn. Refilled today. Referral for PT for dry needling, which has also helped reduce pain and secondary migraines.

## 2022-07-05 NOTE — Progress Notes (Signed)
Subjective:    Patient ID: Tina Pena, female    DOB: 21-Jan-1956, 67 y.o.   MRN: [id number]  Chief Complaint  Patient presents with   Medication Refill    Pt in office for medication refill; pt is having ringing in ears and seen neuro but says they suggested she needs to see PCP for hearing referral, could be losing hearing. More left than right    HPI Patient is in today for hearing and tinnitus concerns.   Daughter says she hasn't been hearing well. Pt states she has ringing as well, more left sided than right.   Audiometry testing in office: Right - refer Left - refer  Also needing PT referral and Tramadol refill for neck pain.   Past Medical History:  Diagnosis Date   Acid reflux    Allergy    Anal fissure    Arthritis    Barrett's esophagus    Bipolar disorder (HCC)    Cervical herniated disc    Chronic headaches    Chronic pain    Depression    Dermatomyositis (HCC)    GERD (gastroesophageal reflux disease)    H/O vitamin D deficiency    Hypercholesterolemia    IBS (irritable bowel syndrome)    Ocular migraine    Osteoporosis     Past Surgical History:  Procedure Laterality Date   APPENDECTOMY     GALLBLADDER SURGERY  2001   HERNIA REPAIR     RETINAL DETACHMENT SURGERY Right 11/29/2021   TONSILLECTOMY  4098   toupetfundoplicatio  1191   TUBAL LIGATION      Family History  Problem Relation Age of Onset   Cancer Father        breast   Bipolar disorder Father    Anxiety disorder Father    Depression Father    Paranoid behavior Father    Heart disease Father    Stroke Maternal Grandmother    Hypertension Maternal Grandmother    Bipolar disorder Maternal Grandmother    Dementia Maternal Grandmother    Hypertension Maternal Grandfather    Stroke Paternal Grandmother    Hypertension Paternal Grandmother    Bipolar disorder Paternal Grandmother    Dementia Paternal Grandmother    Hypertension Paternal Grandfather    Stomach cancer Neg Hx     Colon cancer Neg Hx    Esophageal cancer Neg Hx     Social History   Tobacco Use   Smoking status: Never   Smokeless tobacco: Never  Vaping Use   Vaping Use: Never used  Substance Use Topics   Alcohol use: Not Currently    Alcohol/week: 2.0 standard drinks of alcohol    Types: 2 Glasses of wine per week    Comment: occasional glass of wine   Drug use: Never     Allergies  Allergen Reactions   Codeine Nausea And Vomiting    Other reaction(s): extreme stomach upset, GI Intolerance   Garlic Nausea And Vomiting   Other Nausea And Vomiting    Onions    Sertraline Rash    Other reaction(s): Confusion Other reaction(s): confusion, Other (See Comments) Other reaction(s): Confusion    Methotrexate     Other reaction(s): elevated LFT's Other reaction(s): elevated LFT's     Review of Systems NEGATIVE UNLESS OTHERWISE INDICATED IN HPI      Objective:     BP 138/84 (BP Location: Right Arm)   Pulse 95   Temp 97.9 F (36.6 C) (Temporal)   Ht 5'  8" (1.727 m)   Wt 158 lb 6.4 oz (71.8 kg)   SpO2 99%   BMI 24.08 kg/m   Wt Readings from Last 3 Encounters:  07/05/22 158 lb 6.4 oz (71.8 kg)  06/27/22 160 lb (72.6 kg)  05/30/22 157 lb (71.2 kg)    BP Readings from Last 3 Encounters:  07/05/22 138/84  06/27/22 121/75  05/30/22 136/86     Physical Exam Vitals and nursing note reviewed.  Constitutional:      Appearance: Normal appearance.  HENT:     Right Ear: Tympanic membrane, ear canal and external ear normal. There is no impacted cerumen.     Left Ear: Tympanic membrane, ear canal and external ear normal. There is no impacted cerumen.  Cardiovascular:     Pulses: Normal pulses.  Pulmonary:     Effort: Pulmonary effort is normal.     Breath sounds: Normal breath sounds.  Neurological:     Mental Status: She is alert.  Psychiatric:        Mood and Affect: Mood normal.        Assessment & Plan:  Tinnitus of left ear Assessment & Plan: Worse  recently. Of note, on plaquenil for the last 10 years. Pt will reach out to her rheumatologist in this regard. Referral sent to ENT as well.   Orders: -     Ambulatory referral to ENT  Decreased hearing of both ears -     Ambulatory referral to ENT  Cervical radiculopathy Assessment & Plan: PDMP reviewed today, no red flags, filling appropriately.  Doing well with Tramadol 50 mg TID prn. Refilled today. Referral for PT for dry needling, which has also helped reduce pain and secondary migraines.   Orders: -     Ambulatory referral to Physical Therapy  Neck pain, chronic -     Ambulatory referral to Physical Therapy  Other orders -     traMADol HCl; Take 1 tablet (50 mg total) by mouth 3 (three) times daily as needed. TAKE 1 TABLET BY MOUTH EVERY 6 (SIX) HOURS AS NEEDED FOR MODERATE PAIN (6-8 PER DAY DEPENDING ON PAIN).  Dispense: 90 tablet; Refill: 0        Return if symptoms worsen or fail to improve.     Redell Nazir M Manvir Prabhu, PA-C

## 2022-07-07 ENCOUNTER — Telehealth: Payer: Medicare Other | Admitting: Family Medicine

## 2022-07-07 ENCOUNTER — Other Ambulatory Visit: Payer: Self-pay | Admitting: Physician Assistant

## 2022-07-07 ENCOUNTER — Telehealth: Payer: Self-pay | Admitting: Physician Assistant

## 2022-07-07 DIAGNOSIS — N3000 Acute cystitis without hematuria: Secondary | ICD-10-CM

## 2022-07-07 MED ORDER — SULFAMETHOXAZOLE-TRIMETHOPRIM 800-160 MG PO TABS: 1.0000 | ORAL_TABLET | Freq: Two times a day (BID) | ORAL | 0 refills | Status: AC

## 2022-07-07 NOTE — Telephone Encounter (Signed)
Please see pt msg and advise 

## 2022-07-07 NOTE — Progress Notes (Signed)
E-Visit for Urinary Problems  We are sorry that you are not feeling well.  Here is how we plan to help!  Based on what you shared with me it looks like you most likely have a simple urinary tract infection.  A UTI (Urinary Tract Infection) is a bacterial infection of the bladder.  Most cases of urinary tract infections are simple to treat but a key part of your care is to encourage you to drink plenty of fluids and watch your symptoms carefully.  I have prescribed Bactrim DS One tablet twice a day for 5 days.  Your symptoms should gradually improve. Call us if the burning in your urine worsens, you develop worsening fever, back pain or pelvic pain or if your symptoms do not resolve after completing the antibiotic.  Urinary tract infections can be prevented by drinking plenty of water to keep your body hydrated.  Also be sure when you wipe, wipe from front to back and don't hold it in!  If possible, empty your bladder every 4 hours.  HOME CARE Drink plenty of fluids Compete the full course of the antibiotics even if the symptoms resolve Remember, when you need to go.go. Holding in your urine can increase the likelihood of getting a UTI! GET HELP RIGHT AWAY IF: You cannot urinate You get a high fever Worsening back pain occurs You see blood in your urine You feel sick to your stomach or throw up You feel like you are going to pass out  MAKE SURE YOU  Understand these instructions. Will watch your condition. Will get help right away if you are not doing well or get worse.   Thank you for choosing an e-visit.  Your e-visit answers were reviewed by a board certified advanced clinical practitioner to complete your personal care plan. Depending upon the condition, your plan could have included both over the counter or prescription medications.  Please review your pharmacy choice. Make sure the pharmacy is open so you can pick up prescription now. If there is a problem, you may contact  your provider through MyChart messaging and have the prescription routed to another pharmacy.  Your safety is important to us. If you have drug allergies check your prescription carefully.   For the next 24 hours you can use MyChart to ask questions about today's visit, request a non-urgent call back, or ask for a work or school excuse. You will get an email in the next two days asking about your experience. I hope that your e-visit has been valuable and will speed your recovery.    have provided 5 minutes of non face to face time during this encounter for chart review and documentation.   

## 2022-07-07 NOTE — Telephone Encounter (Signed)
Pt states she has chronic UTIs and has one now, she is going out of the country on Monday, 07/10/22 and would like an RX that has been prescribed before, we have no appts for her to come in, she needs a call back at least by today because of her leaving out of country on Monday. Please advise.

## 2022-07-17 ENCOUNTER — Encounter: Payer: Self-pay | Admitting: Physical Therapy

## 2022-07-17 ENCOUNTER — Ambulatory Visit: Payer: Medicare Other | Admitting: Physical Therapy

## 2022-07-17 DIAGNOSIS — M542 Cervicalgia: Secondary | ICD-10-CM

## 2022-07-17 NOTE — Therapy (Signed)
OUTPATIENT PHYSICAL THERAPY CERVICAL Evaluation   Patient Name: Tina Pena MRN: [id number] DOB:30-Jul-1955, 67 y.o., female Today's Date: 07/17/2022     PT End of Session - 07/17/22 1540     Visit Number 1    Number of Visits 1    Date for PT Re-Evaluation 07/17/22    Authorization Type Medicare    PT Start Time 1515    PT Stop Time 1530    PT Time Calculation (min) 15 min    Activity Tolerance Patient tolerated treatment well    Behavior During Therapy WFL for tasks assessed/performed               Past Medical History:  Diagnosis Date   Acid reflux    Allergy    Anal fissure    Arthritis    Barrett's esophagus    Bipolar disorder ([place])    Cervical herniated disc    Chronic headaches    Chronic pain    Depression    Dermatomyositis ([place])    GERD (gastroesophageal reflux disease)    H/O vitamin D deficiency    Hypercholesterolemia    IBS (irritable bowel syndrome)    Ocular migraine    Osteoporosis    Past Surgical History:  Procedure Laterality Date   APPENDECTOMY     GALLBLADDER SURGERY  2001   HERNIA REPAIR     RETINAL DETACHMENT SURGERY Right 11/29/2021   TONSILLECTOMY  7169   toupetfundoplicatio  6789   TUBAL LIGATION     Patient Active Problem List   Diagnosis Date Noted   Tinnitus of left ear 07/05/2022   Decreased hearing of both ears 07/05/2022   Chronic constipation 02/21/2022   Gastroesophageal reflux disease 02/21/2022   Dysphagia 02/21/2022   Anal fissure 02/21/2022   Neck pain, chronic 10/20/2021   Cervical radiculopathy 10/20/2021   Calcific tendinitis of right shoulder 10/24/2018   MDD (major depressive disorder), recurrent episode, moderate ([place]) 01/12/2018   Vitamin D deficiency 05/11/2017   Alopecia 12/12/2016   Atrophy of vagina 12/12/2016   Depression 12/12/2016   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   Diverticulosis 12/12/2016   Fibromyalgia 12/12/2016   Gastritis 12/12/2016   H/O multiple allergies 12/12/2016   Herpes  12/12/2016   Iron deficiency 38/03/1750   Lichen simplex chronicus 12/12/2016   Migraines 12/12/2016   Mollusca contagiosa 12/12/2016    PCP: Allwardt, Randa Evens, PA-C  REFERRING PROVIDER: Allwardt, Randa Evens, PA-C  REFERRING DIAG: M54.2 (ICD-10-CM) - Cervicalgia   THERAPY DIAG:  Cervicalgia  Rationale for Evaluation and Treatment Rehabilitation  ONSET DATE:   SUBJECTIVE:  SUBJECTIVE STATEMENT:  Pt referred to PT, states that she is not having a current "episode" of pain, but has had chronic neck issues with migraines in the past. She just had full episode of PT, that ended in November, which she felt was helpful. She states little pain at this time, and does not think she needs more PT. She does have question about dry needling on an as needed basis.   PERTINENT HISTORY:  PMH: Arthritis, chronic migraines, depression, chronic neck pain, osteoporosis  PAIN:  Are you having pain? No  PRECAUTIONS: None  PATIENT GOALS   OBJECTIVE:   TODAY'S TREATMENT:     PATIENT EDUCATION:  Education details: PT POC,  Person educated: Patient Education method: Customer service manager Education comprehension: verbalized understanding and returned demonstration   HOME EXERCISE PROGRAM: Access Code: [email or web address]  ASSESSMENT:  CLINICAL IMPRESSION:  Pt with no current deficits that require skilled care. She feels she is doing very well with previous HEP from PT that ended in November. We did discussed continued management, and options for dry needling as needed at another facility. Pt does not have any other questions about current HEP. Pt not in need of PT at this time.     Lyndee Hensen, PT, DPT 4:01 PM  07/17/22

## 2022-07-24 ENCOUNTER — Ambulatory Visit: Payer: Medicare Other | Admitting: Physician Assistant

## 2022-08-07 ENCOUNTER — Ambulatory Visit (INDEPENDENT_AMBULATORY_CARE_PROVIDER_SITE_OTHER): Payer: Medicare Other | Admitting: Physician Assistant

## 2022-08-07 VITALS — BP 134/78 | HR 85 | Temp 98.0°F | Ht 68.0 in | Wt 162.0 lb

## 2022-08-07 DIAGNOSIS — G8929 Other chronic pain: Secondary | ICD-10-CM | POA: Diagnosis not present

## 2022-08-07 DIAGNOSIS — M542 Cervicalgia: Secondary | ICD-10-CM | POA: Diagnosis not present

## 2022-08-07 DIAGNOSIS — M818 Other osteoporosis without current pathological fracture: Secondary | ICD-10-CM | POA: Diagnosis not present

## 2022-08-07 MED ORDER — TRAMADOL HCL 50 MG PO TABS: 50.0000 mg | ORAL_TABLET | Freq: Three times a day (TID) | ORAL | 0 refills | Status: DC

## 2022-08-07 NOTE — Assessment & Plan Note (Signed)
PDMP reviewed today, no red flags, filling appropriately.  Currently stable on tramadol 50 mg 3 times daily.  Refilled this medication today.  She will follow-up with PT as needed.

## 2022-08-07 NOTE — Assessment & Plan Note (Signed)
Per stated from patient.  I do not have her DEXA scan available for review.  I asked her to provide a copy and I can review.  Low to moderate risk for fracture just based on discussion today.  She is concerned about Prolia side effects.  Also concerned about IV treatment such as Reclast and possible side effects.  She cannot do Fosamax because of her GI issues.  At this time, collaboratively agreed to max out vitamin D and calcium supplement daily.  Continue weightbearing exercises.  We can monitor DEXA scan every 2 to 3 years.

## 2022-08-07 NOTE — Progress Notes (Signed)
Subjective:    Patient ID: Tina Pena, female    DOB: 01-Mar-1956, 68 y.o.   MRN: [id number]  Chief Complaint  Patient presents with   Osteoporosis    Pt in to discuss treatment for Osteoporosis, was on medication (Fosamax) previously and couldn't take it due to swallowing and worse GI issues;     HPI Patient is in today for discussion about osteoporosis. Orthopedic suggested she start conversation about treatment. Bone density test was done at [facility] ?2 years ago. Can't take Fosamax due to swallowing and GI issues.  She has been walking and strength training.  She is taking Vitamin D, Calcium, and Tumeric supplements daily.   She has had one fracture in left hand middle finger after dog pulled her leash too hard - maybe in 2015. No other fractures in life.   Also had joint to come off hydroxychloroquine to see if this would reduce her tinnitus; but symptoms did not resolve.  During the time she was off medication, she was having worsening joint pain and neck pain.  States that she was taking the tramadol 3 times daily.  She has 10 pills left.  Needs refill sent into her pharmacy.   Past Medical History:  Diagnosis Date   Acid reflux    Allergy    Anal fissure    Arthritis    Barrett's esophagus    Bipolar disorder (HCC)    Cervical herniated disc    Chronic headaches    Chronic pain    Depression    Dermatomyositis (HCC)    GERD (gastroesophageal reflux disease)    H/O vitamin D deficiency    Hypercholesterolemia    IBS (irritable bowel syndrome)    Ocular migraine    Osteoporosis     Past Surgical History:  Procedure Laterality Date   APPENDECTOMY     GALLBLADDER SURGERY  2001   HERNIA REPAIR     RETINAL DETACHMENT SURGERY Right 11/29/2021   TONSILLECTOMY  [id number]   toupetfundoplicatio  [id number]   TUBAL LIGATION      Family History  Problem Relation Age of Onset   Cancer Father        breast   Bipolar disorder Father    Anxiety disorder Father     Depression Father    Paranoid behavior Father    Heart disease Father    Stroke Maternal Grandmother    Hypertension Maternal Grandmother    Bipolar disorder Maternal Grandmother    Dementia Maternal Grandmother    Hypertension Maternal Grandfather    Stroke Paternal Grandmother    Hypertension Paternal Grandmother    Bipolar disorder Paternal Grandmother    Dementia Paternal Grandmother    Hypertension Paternal Grandfather    Stomach cancer Neg Hx    Colon cancer Neg Hx    Esophageal cancer Neg Hx     Social History   Tobacco Use   Smoking status: Never   Smokeless tobacco: Never  Vaping Use   Vaping Use: Never used  Substance Use Topics   Alcohol use: Not Currently    Alcohol/week: 2.0 standard drinks of alcohol    Types: 2 Glasses of wine per week    Comment: occasional glass of wine   Drug use: Never     Allergies  Allergen Reactions   Codeine Nausea And Vomiting    Other reaction(s): extreme stomach upset, GI Intolerance   Garlic Nausea And Vomiting   Other Nausea And Vomiting    Onions  Sertraline Rash    Other reaction(s): Confusion Other reaction(s): confusion, Other (See Comments) Other reaction(s): Confusion    Methotrexate     Other reaction(s): elevated LFT's Other reaction(s): elevated LFT's     Review of Systems NEGATIVE UNLESS OTHERWISE INDICATED IN HPI      Objective:     BP 134/78 (BP Location: Left Arm)   Pulse 85   Temp 98 F (36.7 C) (Temporal)   Ht 5' 8"$  (1.727 m)   Wt 162 lb (73.5 kg)   SpO2 100%   BMI 24.63 kg/m   Wt Readings from Last 3 Encounters:  08/07/22 162 lb (73.5 kg)  07/05/22 158 lb 6.4 oz (71.8 kg)  06/27/22 160 lb (72.6 kg)    BP Readings from Last 3 Encounters:  08/07/22 134/78  07/05/22 138/84  06/27/22 121/75     Physical Exam Vitals and nursing note reviewed.  Constitutional:      Appearance: Normal appearance.  Cardiovascular:     Rate and Rhythm: Normal rate.  Pulmonary:     Effort:  Pulmonary effort is normal.  Neurological:     General: No focal deficit present.     Mental Status: She is alert and oriented to person, place, and time.  Psychiatric:        Mood and Affect: Mood normal.        Behavior: Behavior normal.        Assessment & Plan:  Age-related osteoporosis without fracture Assessment & Plan: Per stated from patient.  I do not have her DEXA scan available for review.  I asked her to provide a copy and I can review.  Low to moderate risk for fracture just based on discussion today.  She is concerned about Prolia side effects.  Also concerned about IV treatment such as Reclast and possible side effects.  She cannot do Fosamax because of her GI issues.  At this time, collaboratively agreed to max out vitamin D and calcium supplement daily.  Continue weightbearing exercises.  We can monitor DEXA scan every 2 to 3 years.    Neck pain, chronic Assessment & Plan: PDMP reviewed today, no red flags, filling appropriately.  Currently stable on tramadol 50 mg 3 times daily.  Refilled this medication today.  She will follow-up with PT as needed.   Other orders -     traMADol HCl; Take 1 tablet (50 mg total) by mouth in the morning, at noon, and at bedtime.  Dispense: 90 tablet; Refill: 0      Return if symptoms worsen or fail to improve.  This note was prepared with assistance of Systems analyst. Occasional wrong-word or sound-a-like substitutions may have occurred due to the inherent limitations of voice recognition software.     Jerald Villalona M Jalacia Mattila, PA-C

## 2022-08-14 ENCOUNTER — Telehealth: Payer: Medicare Other | Admitting: Physician Assistant

## 2022-08-14 DIAGNOSIS — N390 Urinary tract infection, site not specified: Secondary | ICD-10-CM

## 2022-08-14 NOTE — Progress Notes (Signed)
Because of UTI treated in the past month via e-visit and recurring symptoms, I feel your condition warrants further evaluation and I recommend that you be seen in a face to face visit. You need an updated urine culture to make sure the infection is not resistant to certain antibiotics, so we can make sure infection is ressolved.    NOTE: There will be NO CHARGE for this eVisit   If you are having a true medical emergency please call 911.      For an urgent face to face visit, [facility] has eight urgent care centers for your convenience:   NEW!! [facility] [place] [facility] [place] Get Driving Directions [id number] [address] [place], [place] [facility] [place] [facility] [place] Get Driving Directions [id number] [place], [place] [address]   [facility]) Get Driving Directions [id number] [address], [place] [address]  [facility] [place] ([place]) Get Driving Directions [id number] [address] [place],  [place]  [address]  [place] [facility] [place] [facility] at [place] Get Driving Directions  [phone] [address] [place],  [place] [address]   [facility] Get Driving Directions [id number] [place], [place], [place] [address]   [facility] Get Driving Directions  [id number] [address] [place], [place] [address]   [facility] [place] Get Driving Directions [id number] [address] [place], [place] [address]  Your MyChart E-visit questionnaire answers were reviewed by a board certified advanced clinical practitioner to complete your personal care plan based on your specific symptoms.  Thank you for using e-Visits.

## 2022-08-15 ENCOUNTER — Ambulatory Visit (INDEPENDENT_AMBULATORY_CARE_PROVIDER_SITE_OTHER): Payer: Medicare Other | Admitting: Physician Assistant

## 2022-08-15 ENCOUNTER — Encounter: Payer: Self-pay | Admitting: Physician Assistant

## 2022-08-15 VITALS — BP 138/72 | HR 81 | Temp 97.5°F | Ht 68.0 in | Wt 157.2 lb

## 2022-08-15 DIAGNOSIS — R300 Dysuria: Secondary | ICD-10-CM

## 2022-08-15 LAB — POCT URINALYSIS DIPSTICK
Bilirubin, UA: NEGATIVE
Blood, UA: NEGATIVE
Glucose, UA: NEGATIVE
Ketones, UA: NEGATIVE
Nitrite, UA: POSITIVE
Protein, UA: POSITIVE — AB
Spec Grav, UA: 1.02 (ref 1.010–1.025)
Urobilinogen, UA: 0.2 E.U./dL
pH, UA: 6 (ref 5.0–8.0)

## 2022-08-15 MED ORDER — SULFAMETHOXAZOLE-TRIMETHOPRIM 800-160 MG PO TABS: 1.0000 | ORAL_TABLET | Freq: Two times a day (BID) | ORAL | 0 refills | Status: AC

## 2022-08-15 NOTE — Progress Notes (Signed)
Subjective:    Patient ID: Tina Pena, female    DOB: 03/25/1956, 67 y.o.   MRN: [id number]  Chief Complaint  Patient presents with   Urinary Tract Infection    Pt in for possible UTI; pt taken [place] urinary care OTC for UTI infection control, completed POC urine dip, pt having symptoms since Friday; Saturday woke her up in the middle of the night. Bad odor in urine; pt has appt with specialists next Wednesday;     Urinary Tract Infection    Patient is in today for possible UTI. Sx's starting 3-4 days ago.  Dysuria, urinary frequency, odor to urine, right lower suprapubic pain intermittent. No fever or chills. No flank pain. Has appt with urogyn next week.   Past Medical History:  Diagnosis Date   Acid reflux    Allergy    Anal fissure    Arthritis    Barrett's esophagus    Bipolar disorder (HCC)    Cervical herniated disc    Chronic headaches    Chronic pain    Depression    Dermatomyositis (HCC)    GERD (gastroesophageal reflux disease)    H/O vitamin D deficiency    Hypercholesterolemia    IBS (irritable bowel syndrome)    Ocular migraine    Osteoporosis     Past Surgical History:  Procedure Laterality Date   APPENDECTOMY     GALLBLADDER SURGERY  2001   HERNIA REPAIR     RETINAL DETACHMENT SURGERY Right 11/29/2021   TONSILLECTOMY  [id number]   toupetfundoplicatio  [id number]   TUBAL LIGATION      Family History  Problem Relation Age of Onset   Cancer Father        breast   Bipolar disorder Father    Anxiety disorder Father    Depression Father    Paranoid behavior Father    Heart disease Father    Stroke Maternal Grandmother    Hypertension Maternal Grandmother    Bipolar disorder Maternal Grandmother    Dementia Maternal Grandmother    Hypertension Maternal Grandfather    Stroke Paternal Grandmother    Hypertension Paternal Grandmother    Bipolar disorder Paternal Grandmother    Dementia Paternal Grandmother    Hypertension Paternal Grandfather     Stomach cancer Neg Hx    Colon cancer Neg Hx    Esophageal cancer Neg Hx     Social History   Tobacco Use   Smoking status: Never   Smokeless tobacco: Never  Vaping Use   Vaping Use: Never used  Substance Use Topics   Alcohol use: Not Currently    Alcohol/week: 2.0 standard drinks of alcohol    Types: 2 Glasses of wine per week    Comment: occasional glass of wine   Drug use: Never     Allergies  Allergen Reactions   Codeine Nausea And Vomiting    Other reaction(s): extreme stomach upset, GI Intolerance   Garlic Nausea And Vomiting   Other Nausea And Vomiting    Onions    Sertraline Rash    Other reaction(s): Confusion Other reaction(s): confusion, Other (See Comments) Other reaction(s): Confusion    Methotrexate     Other reaction(s): elevated LFT's Other reaction(s): elevated LFT's     Review of Systems NEGATIVE UNLESS OTHERWISE INDICATED IN HPI      Objective:     BP 138/72 (BP Location: Right Arm) Comment (BP Location): manual  Pulse 81   Temp (!) 97.5 F (36.4 C) (  Temporal)   Ht 5' 8"$  (1.727 m)   Wt 157 lb 3.2 oz (71.3 kg)   SpO2 100%   BMI 23.90 kg/m   Wt Readings from Last 3 Encounters:  08/15/22 157 lb 3.2 oz (71.3 kg)  08/07/22 162 lb (73.5 kg)  07/05/22 158 lb 6.4 oz (71.8 kg)    BP Readings from Last 3 Encounters:  08/15/22 138/72  08/07/22 134/78  07/05/22 138/84     Physical Exam Vitals and nursing note reviewed.  Constitutional:      General: She is not in acute distress.    Appearance: Normal appearance. She is not ill-appearing.  HENT:     Head: Normocephalic and atraumatic.  Cardiovascular:     Rate and Rhythm: Normal rate and regular rhythm.     Pulses: Normal pulses.     Heart sounds: Normal heart sounds.  Pulmonary:     Effort: Pulmonary effort is normal.     Breath sounds: Normal breath sounds.  Abdominal:     General: Abdomen is flat. Bowel sounds are normal. There is no distension.     Palpations: Abdomen is  soft. There is no mass.     Tenderness: There is no abdominal tenderness. There is no right CVA tenderness or left CVA tenderness.  Skin:    General: Skin is warm and dry.  Neurological:     General: No focal deficit present.     Mental Status: She is alert.  Psychiatric:        Mood and Affect: Mood normal.        Assessment & Plan:  Dysuria -     POCT urinalysis dipstick -     Urine Culture; Future  Other orders -     Sulfamethoxazole-Trimethoprim; Take 1 tablet by mouth 2 (two) times daily for 5 days.  Dispense: 10 tablet; Refill: 0   Dysuria - U/A performed in office today. Will send urine for culture and treat with Bactrim at this time. Increase water intake. May take AZO for symptomatic relief. Recheck sooner if fever, severe back pain, vomiting, or other acutely worsening symptoms.      Return if symptoms worsen or fail to improve.     Harrold Fitchett M Shakedra Beam, PA-C

## 2022-08-16 ENCOUNTER — Other Ambulatory Visit: Payer: Self-pay

## 2022-08-16 DIAGNOSIS — R300 Dysuria: Secondary | ICD-10-CM

## 2022-08-16 NOTE — Addendum Note (Signed)
Addended by: Jodell Cipro on: 08/16/2022 08:41 AM   Modules accepted: Orders

## 2022-08-18 LAB — URINE CULTURE
MICRO NUMBER:: 14595339
SPECIMEN QUALITY:: ADEQUATE

## 2022-08-23 ENCOUNTER — Ambulatory Visit (INDEPENDENT_AMBULATORY_CARE_PROVIDER_SITE_OTHER): Payer: Medicare Other | Admitting: Obstetrics and Gynecology

## 2022-08-23 ENCOUNTER — Encounter: Payer: Self-pay | Admitting: Obstetrics and Gynecology

## 2022-08-23 ENCOUNTER — Other Ambulatory Visit (HOSPITAL_COMMUNITY)
Admission: RE | Admit: 2022-08-23 | Discharge: 2022-08-23 | Disposition: A | Discharge status: Home / Self Care | Payer: Medicare Other | Attending: Obstetrics and Gynecology | Admitting: Obstetrics and Gynecology

## 2022-08-23 VITALS — BP 133/76 | HR 78 | Ht 67.56 in | Wt 161.0 lb

## 2022-08-23 DIAGNOSIS — N390 Urinary tract infection, site not specified: Secondary | ICD-10-CM

## 2022-08-23 DIAGNOSIS — R350 Frequency of micturition: Secondary | ICD-10-CM | POA: Diagnosis not present

## 2022-08-23 DIAGNOSIS — R82998 Other abnormal findings in urine: Secondary | ICD-10-CM | POA: Diagnosis present

## 2022-08-23 LAB — POCT URINALYSIS DIPSTICK
Bilirubin, UA: NEGATIVE
Blood, UA: NEGATIVE
Glucose, UA: NEGATIVE
Ketones, UA: NEGATIVE
Nitrite, UA: NEGATIVE
Protein, UA: POSITIVE — AB
Spec Grav, UA: >=1.03 — AB (ref 1.010–1.025)
Urobilinogen, UA: 0.2 E.U./dL
pH, UA: 6 (ref 5.0–8.0)

## 2022-08-23 MED ORDER — ESTRADIOL 0.1 MG/GM VA CREA: 0.5000 g | TOPICAL_CREAM | VAGINAL | 11 refills | Status: DC

## 2022-08-23 MED ORDER — SULFAMETHOXAZOLE-TRIMETHOPRIM 400-80 MG PO TABS: 1.0000 | ORAL_TABLET | Freq: Every day | ORAL | 5 refills | Status: DC

## 2022-08-23 NOTE — Progress Notes (Signed)
[facility] New Patient Evaluation and Consultation  Referring Provider: Allwardt, Randa Evens, PA-C PCP: Fredirick Lathe, PA-C Date of Service: 08/23/2022  SUBJECTIVE Chief Complaint: New Patient (Initial Visit)  History of Present Illness: Tina Pena is a 67 y.o. White or Caucasian female seen in consultation at the request of PA Alyssa Allwardt for evaluation of recurrent UTI.    Review of records significant for: Urine cultures:  08/16/22 ESBL E.Coli. Multiple resistances 05/30/22 ESBL E.Coli. Multiple resistances 04/28/22 ESBL E.Coli. Multiple resistances 02/28/22 50-100,000 coag negative staph, likely colonizers from genitalia  Urinary Symptoms: Does not leak urine.   Day time voids 6-8. Feels a lot of urgency.  Nocturia: 1-3 times per night to void. Voiding dysfunction: she does not empty her bladder well.  does not use a catheter to empty bladder.  When urinating, she feels difficulty starting urine stream  UTIs: 3 UTI's in the last year.  Symptoms start with pain in the pelvic area, dysuria and frequency, cloudy urine. Symptoms get better with treatment then come back.  She is taking a probiotic for prevention.  She tried keflex daily for 6 weeks last year.  Currently has pressure but no symptoms of UTI.  Denies history of blood in urine and kidney or bladder stones  Pelvic Organ Prolapse Symptoms:                  She Denies a feeling of a bulge the vaginal area.   Bowel Symptom: Bowel movements: every 3-4 days Stool consistency: hard Straining: yes.  Splinting: no.  Incomplete evacuation: yes.  She Denies accidental bowel leakage / fecal incontinence Bowel regimen: diet, fiber, stool softener, and miralax (taking every day), drinking lots of water Last colonoscopy: Date 2014  Sexual Function Sexually active: yes.  Sexual orientation: Straight Pain with sex: Yes, has discomfort due to dryness  Pelvic Pain Denies pelvic pain    Past  Medical History:  Past Medical History:  Diagnosis Date   Acid reflux    Allergy    Anal fissure    Arthritis    Barrett's esophagus    Bipolar disorder (HCC)    Cervical herniated disc    Chronic headaches    Chronic pain    Depression    Dermatomyositis (HCC)    GERD (gastroesophageal reflux disease)    H/O vitamin D deficiency    Hypercholesterolemia    IBS (irritable bowel syndrome)    Ocular migraine    Osteoporosis      Past Surgical History:   Past Surgical History:  Procedure Laterality Date   APPENDECTOMY     ESOPHAGOGASTRIC FUNDOPLICATION     GALLBLADDER SURGERY  2001   RETINAL DETACHMENT SURGERY Right 11/29/2021   TONSILLECTOMY  99991111   toupetfundoplicatio  [id number]   TUBAL LIGATION       Past OB/GYN History: OB History  Gravida Para Term Preterm AB Living  '2 2 2     2  '$ SAB IAB Ectopic Multiple Live Births          2    # Outcome Date GA Lbr Len/2nd Weight Sex Delivery Anes PTL Lv  2 Term      Vag-Spont     1 Term      Vag-Spont       Menopausal: Yes, Denies vaginal bleeding since menopause Last pap smear was 2022- negative per patient.     Medications: She has a current medication list which includes the following prescription(s): betamethasone valerate  ointment, [START ON 08/24/2022] estradiol, hydroxychloroquine, infliximab, claritin-d 24 hour, lorazepam, methenamine-sodium salicylate, multiple vitamins-minerals, naratriptan, nystatin ointment, pantoprazole, probiotic product, sucralfate, sulfamethoxazole-trimethoprim, tramadol, valacyclovir, and vilazodone hcl.   Allergies: Patient is allergic to codeine, garlic, other, sertraline, and methotrexate.   Social History:  Social History   Tobacco Use   Smoking status: Never   Smokeless tobacco: Never  Vaping Use   Vaping Use: Never used  Substance Use Topics   Alcohol use: Yes    Alcohol/week: 2.0 standard drinks of alcohol    Types: 2 Glasses of wine per week    Comment: occasional glass of  wine   Drug use: Never    Relationship status: married She lives with husband.   She is not employed. Regular exercise: Yes: walking, stretching, light weights History of abuse: No  Family History:   Family History  Problem Relation Age of Onset   Cancer Mother    Lung cancer Mother    COPD Father    Cancer Father        breast   Bipolar disorder Father    Anxiety disorder Father    Depression Father    Paranoid behavior Father    Heart disease Father    Stroke Maternal Grandmother    Hypertension Maternal Grandmother    Bipolar disorder Maternal Grandmother    Dementia Maternal Grandmother    Hypertension Maternal Grandfather    Stroke Paternal Grandmother    Hypertension Paternal Grandmother    Bipolar disorder Paternal Grandmother    Dementia Paternal Grandmother    Hypertension Paternal Grandfather    Stomach cancer Neg Hx    Colon cancer Neg Hx    Esophageal cancer Neg Hx      Review of Systems: Review of Systems  Constitutional:  Negative for fever, malaise/fatigue and weight loss.  Respiratory:  Negative for cough, shortness of breath and wheezing.   Cardiovascular:  Negative for chest pain, palpitations and leg swelling.  Gastrointestinal:  Negative for abdominal pain and blood in stool.  Genitourinary:  Negative for dysuria.  Musculoskeletal:  Negative for myalgias.  Skin:  Negative for rash.  Neurological:  Positive for headaches. Negative for dizziness.  Endo/Heme/Allergies:  Does not bruise/bleed easily.  Psychiatric/Behavioral:  Negative for depression. The patient is not nervous/anxious.      OBJECTIVE Physical Exam: Vitals:   08/23/22 1028  BP: 133/76  Pulse: 78  Weight: 161 lb (73 kg)  Height: 5' 7.56" (1.716 m)    Physical Exam Constitutional:      General: She is not in acute distress. Pulmonary:     Effort: Pulmonary effort is normal.  Abdominal:     General: There is no distension.     Palpations: Abdomen is soft.      Tenderness: There is no abdominal tenderness. There is no rebound.  Musculoskeletal:        General: No swelling. Normal range of motion.  Skin:    General: Skin is warm and dry.     Findings: No rash.  Neurological:     Mental Status: She is alert and oriented to person, place, and time.  Psychiatric:        Mood and Affect: Mood normal.        Behavior: Behavior normal.      GU / Detailed Urogynecologic Evaluation:  Pelvic Exam: Normal external female genitalia; Bartholin's and Skene's glands normal in appearance; urethral meatus normal in appearance, no urethral masses or discharge.   CST: negative  Speculum exam reveals normal vaginal mucosa with atrophy. Cervix normal appearance. Uterus normal single, nontender. Adnexa no mass, fullness, tenderness.     Pelvic floor strength II/V  Pelvic floor musculature: Right levator non-tender, Right obturator non-tender, Left levator non-tender, Left obturator non-tender  POP-Q:   POP-Q  -3                                            Aa   -3                                           Ba  -6.5                                              C   1.5                                            Gh  4                                            Pb  8                                            tvl   -3                                            Ap  -3                                            Bp  -8                                              D      Rectal Exam:  Normal external retum  Post-Void Residual (PVR) by Bladder Scan: In order to evaluate bladder emptying, we discussed obtaining a postvoid residual and she agreed to this procedure.  Procedure: The ultrasound unit was placed on the patient's abdomen in the suprapubic region after the patient had voided. A PVR of 3 ml was obtained by bladder scan.  Laboratory Results: POC urine: small leukocytes   ASSESSMENT AND PLAN Ms. Loayza is a 67 y.o. with:  1.  Recurrent urinary tract infection   2. Urinary frequency   3. Leukocytes in urine    - For treatment of recurrent urinary tract infections, we discussed management of recurrent UTIs including prophylaxis with a daily low dose antibiotic, transvaginal estrogen therapy,  D-mannose, and cranberry supplements.   - Prescribed Bactrim 1 tab daily x 6 months - Rx sent to estrace 0.5g nightly for two weeks then twice a week after .  - Can obtain D-mannose and cranberry OTC, continue with probiotic - Leukocytes in urine today- will send for culture  Return 6 months or sooner if needed  Jaquita Folds, MD

## 2022-08-23 NOTE — Patient Instructions (Signed)
Start vaginal estrogen therapy nightly for two weeks then 2 times weekly at night for treatment of vaginal atrophy (dryness of the vaginal tissues).  Please let us know if the prescription is too expensive and we can look for alternative options. This is to prevent UTIs Can get D-mannose and cranberry supplements over the counter to take for UTI prevention Prescribed bactrim once a day for 6 months for prevention.  If you have symptoms of a UTI, call the office to come give a urine sample for testing.

## 2022-08-24 LAB — URINE CULTURE: Culture: NO GROWTH

## 2022-09-11 ENCOUNTER — Other Ambulatory Visit: Payer: Self-pay | Admitting: Physician Assistant

## 2022-09-27 DIAGNOSIS — H903 Sensorineural hearing loss, bilateral: Secondary | ICD-10-CM | POA: Insufficient documentation

## 2022-10-18 DIAGNOSIS — M654 Radial styloid tenosynovitis [de Quervain]: Secondary | ICD-10-CM | POA: Insufficient documentation

## 2022-10-30 ENCOUNTER — Other Ambulatory Visit: Payer: Self-pay | Admitting: Physician Assistant

## 2022-10-30 NOTE — Telephone Encounter (Signed)
Last OV: 08/15/22  Next OV: 06/15/23  Last filled: 09/12/22  Quantity; 90 w/ no refills

## 2022-11-05 ENCOUNTER — Other Ambulatory Visit: Payer: Self-pay | Admitting: Psychiatry

## 2022-11-06 ENCOUNTER — Other Ambulatory Visit: Payer: Self-pay | Admitting: Psychiatry

## 2022-11-06 NOTE — Telephone Encounter (Signed)
Last seen 07/19/22 per note "Unable to tolerate propranolol due to night terrors, resolved after stopping. "  Follow up scheduled on 03/28/23

## 2022-11-06 NOTE — Telephone Encounter (Signed)
Pt last seen on 06/27/22 per note "-Rescue: Continue naratriptan PRN "  Follow up scheduled on 03/28/23  Last filled on 10/17/22 #7 tablets ( 23 day supply)

## 2022-11-10 DIAGNOSIS — B3731 Acute candidiasis of vulva and vagina: Secondary | ICD-10-CM | POA: Insufficient documentation

## 2022-11-12 DIAGNOSIS — N3941 Urge incontinence: Secondary | ICD-10-CM | POA: Insufficient documentation

## 2022-12-01 ENCOUNTER — Other Ambulatory Visit: Payer: Self-pay | Admitting: Physician Assistant

## 2022-12-01 ENCOUNTER — Other Ambulatory Visit: Payer: Self-pay | Admitting: Psychiatry

## 2022-12-01 DIAGNOSIS — K2970 Gastritis, unspecified, without bleeding: Secondary | ICD-10-CM

## 2022-12-06 ENCOUNTER — Ambulatory Visit: Payer: Medicare Other | Admitting: Gastroenterology

## 2022-12-08 ENCOUNTER — Other Ambulatory Visit: Payer: Self-pay | Admitting: Physician Assistant

## 2023-01-08 ENCOUNTER — Other Ambulatory Visit (HOSPITAL_COMMUNITY)
Admission: RE | Admit: 2023-01-08 | Discharge: 2023-01-08 | Disposition: A | Discharge status: Home / Self Care | Payer: Medicare Other | Source: Other Acute Inpatient Hospital | Attending: Obstetrics and Gynecology | Admitting: Obstetrics and Gynecology

## 2023-01-08 ENCOUNTER — Ambulatory Visit (INDEPENDENT_AMBULATORY_CARE_PROVIDER_SITE_OTHER): Payer: Medicare Other

## 2023-01-08 ENCOUNTER — Ambulatory Visit: Payer: Medicare Other | Admitting: Family Medicine

## 2023-01-08 DIAGNOSIS — N390 Urinary tract infection, site not specified: Secondary | ICD-10-CM

## 2023-01-08 DIAGNOSIS — R82998 Other abnormal findings in urine: Secondary | ICD-10-CM

## 2023-01-08 DIAGNOSIS — R319 Hematuria, unspecified: Secondary | ICD-10-CM

## 2023-01-08 LAB — URINALYSIS, COMPLETE (UACMP) WITH MICROSCOPIC
Bilirubin Urine: NEGATIVE
Glucose, UA: NEGATIVE mg/dL
Hgb urine dipstick: NEGATIVE
Ketones, ur: NEGATIVE mg/dL
Nitrite: POSITIVE — AB
Protein, ur: 100 mg/dL — AB
Specific Gravity, Urine: 1.017 (ref 1.005–1.030)
WBC, UA: >50 WBC/hpf (ref 0–5)
pH: 5 (ref 5.0–8.0)

## 2023-01-08 MED ORDER — NITROFURANTOIN MONOHYD MACRO 100 MG PO CAPS: 100.0000 mg | ORAL_CAPSULE | Freq: Two times a day (BID) | ORAL | 0 refills | Status: DC

## 2023-01-08 MED ORDER — PHENAZOPYRIDINE HCL 200 MG PO TABS: 200.0000 mg | ORAL_TABLET | Freq: Three times a day (TID) | ORAL | 0 refills | Status: DC | PRN

## 2023-01-08 NOTE — Patient Instructions (Addendum)
Your Urine dip that was done in office was POSITIVE. I am sending the urine off for culture and you can take AZO over the counter for your discomfort.  We have also ordered Macrobid for you to take while we wait for your culture results, hopefully this gives you some relief. We will contact you when the results are back between 3-5 days. If a different antibiotic is needed we will sent the order to the pharmacy and you will be notified. If you have any questions or concerns please feel free to call us at [phone]

## 2023-01-08 NOTE — Progress Notes (Signed)
Tina Pena is a 67 y.o. female arrived today with UTI sx.  Per Dr. Jari Favre protocol: A urine specimen was collected and POCT Urine was done and urine culture sent to the lab. POCT Urine was Positive  Pt was notified and prescription sent to the preferred pharmacy.

## 2023-01-08 NOTE — Addendum Note (Signed)
Addended by: Salina April on: 01/08/2023 10:22 AM   Modules accepted: Orders

## 2023-01-09 LAB — URINE CULTURE: Culture: >=100000 — AB

## 2023-01-10 LAB — URINE CULTURE

## 2023-01-10 NOTE — Progress Notes (Signed)
Please call and inform patient that the culture did grow a UTI and the Macrobid she was placed on is sufficient to treat.

## 2023-01-15 ENCOUNTER — Encounter: Payer: Self-pay | Admitting: Obstetrics and Gynecology

## 2023-01-15 ENCOUNTER — Ambulatory Visit (INDEPENDENT_AMBULATORY_CARE_PROVIDER_SITE_OTHER): Payer: Medicare Other | Admitting: Obstetrics and Gynecology

## 2023-01-15 VITALS — BP 120/72 | HR 80

## 2023-01-15 DIAGNOSIS — N390 Urinary tract infection, site not specified: Secondary | ICD-10-CM | POA: Diagnosis not present

## 2023-01-15 DIAGNOSIS — R350 Frequency of micturition: Secondary | ICD-10-CM | POA: Diagnosis not present

## 2023-01-15 MED ORDER — METHENAMINE HIPPURATE 1 G PO TABS: 1.0000 g | ORAL_TABLET | Freq: Two times a day (BID) | ORAL | 2 refills | Status: DC

## 2023-01-15 MED ORDER — METHENAMINE HIPPURATE 1 G PO TABS: 1.0000 g | ORAL_TABLET | Freq: Two times a day (BID) | ORAL | 3 refills | Status: DC

## 2023-01-15 NOTE — Patient Instructions (Addendum)
Start Hiprex twice daily for prevention of UTI  Use the estrogen cream 2-3 times weekly.   Continue on [place]

## 2023-01-15 NOTE — Progress Notes (Signed)
[facility] Return Visit  SUBJECTIVE  History of Present Illness: Bethzaida Boord is a 67 y.o. female seen in follow-up for rUTI. Plan at last visit was start bactrim daily for 6 months. She is taking D-mannose, doing estrogen cream and Uqora.    Patient has had one breakthrough infection since starting her 6 month prophylaxis regimen. This was a MDRO that was resistant to her prophylactic regimen.   Past Medical History: Patient  has a past medical history of Acid reflux, Allergy, Anal fissure, Arthritis, Barrett's esophagus, Bipolar disorder (HCC), Cervical herniated disc, Chronic headaches, Chronic pain, Depression, Dermatomyositis (HCC), GERD (gastroesophageal reflux disease), H/O vitamin D deficiency, Hypercholesterolemia, IBS (irritable bowel syndrome), Ocular migraine, and Osteoporosis.   Past Surgical History: She  has a past surgical history that includes Appendectomy; Tonsillectomy (1981); Gallbladder surgery (2001); Esophagogastric fundoplication; toupetfundoplicatio (2000); Tubal ligation; and Retinal detachment surgery (Right, 11/29/2021).   Medications: She has a current medication list which includes the following prescription(s): betamethasone valerate ointment, estradiol, hydroxychloroquine, infliximab, claritin-d 24 hour, lorazepam, methenamine, methenamine-sodium salicylate, multiple vitamins-minerals, naratriptan, nitrofurantoin (macrocrystal-monohydrate), nystatin ointment, pantoprazole, phenazopyridine, probiotic product, sucralfate, sulfamethoxazole-trimethoprim, valacyclovir, and vilazodone hcl.   Allergies: Patient is allergic to codeine, garlic, other, sertraline, and methotrexate.   Social History: Patient  reports that she has never smoked. She has never used smokeless tobacco. She reports current alcohol use of about 2.0 standard drinks of alcohol per week. She reports that she does not use drugs.      OBJECTIVE     Physical Exam: Vitals:    01/15/23 1056  BP: 120/72  Pulse: 80   Gen: No apparent distress, A&O x 3.  Detailed Urogynecologic Evaluation:  Deferred. Prior exam showed:    ASSESSMENT AND PLAN    Ms. Dethlefs is a 67 y.o. with:  1. Recurrent urinary tract infection   2. Urinary frequency    Patient is finished with her prophylaxis, will start her on Hiprex 1g x2 daily for prevention of UTI. She is taking [place] and I encouraged her to continue this. She is also to continue using the estrogen cream x2 weekly.  We discussed doing breathing exercises and stretching to increase bladder emptying and pelvic floor relaxation.   Patient to follow up for medication f/u.

## 2023-02-09 ENCOUNTER — Telehealth: Payer: Self-pay | Admitting: Physician Assistant

## 2023-02-09 ENCOUNTER — Ambulatory Visit: Payer: Medicare Other | Admitting: Family Medicine

## 2023-02-09 ENCOUNTER — Encounter: Payer: Self-pay | Admitting: Family Medicine

## 2023-02-09 VITALS — BP 120/80 | HR 90 | Temp 98.5°F | Resp 18 | Ht 67.5 in | Wt 158.4 lb

## 2023-02-09 DIAGNOSIS — L309 Dermatitis, unspecified: Secondary | ICD-10-CM | POA: Diagnosis not present

## 2023-02-09 MED ORDER — PREDNISONE 10 MG PO TABS: ORAL_TABLET | ORAL | 0 refills | Status: DC

## 2023-02-09 MED ORDER — METHYLPREDNISOLONE ACETATE 80 MG/ML IJ SUSP
80.0000 mg | Freq: Once | INTRAMUSCULAR | Status: AC
Start: 2023-02-09 — End: 2023-02-09
  Administered 2023-02-09: 80 mg via INTRAMUSCULAR

## 2023-02-09 NOTE — Telephone Encounter (Signed)
[name], Access nurse called stating final outcome was for patient to be seen within 3-4 hours. I was able to get patient scheduled with Dr. Zola Button @ 11:40 am on 8/16.    Patient Name First: Tina Last: Pena Gender: Female DOB: [birth year] Age: 67 Y 6 M 1 D Return Phone Number: [phone] (Primary) Address: City/ State/ Zip: [place]  [address] Client [facility] - Administrator, [facility] at [facility] Insurance claims handler, Media planner- PA Contact Type Call Who Is Calling Patient / Member / Family / Caregiver Call Type Triage / Clinical Relationship To Patient Self Return Phone Number [phone] (Primary) Chief Complaint Facial Swelling Reason for Call Symptomatic / Request for Health Information Initial Comment Caller states she has swelling of her eyes and face and has red itch spots on her hands, arms and torso. Translation No Nurse Assessment Nurse: [place], RN, [name] Date/Time (Eastern Time): 02/09/2023 9:38:59 AM Confirm and document reason for call. If symptomatic, describe symptoms. ---caller states swelling under eyes started 3 days ago. right eye is worse. red itchy spots on hands, arms, torso. went to dermatologist monday and was told neuropathy. Does the patient have any new or worsening symptoms? ---Yes Will a triage be completed? ---Yes Related visit to physician within the last 2 weeks? ---Yes Does the PT have any chronic conditions? (i.e. diabetes, asthma, this includes High risk factors for pregnancy, etc.) ---Yes List chronic conditions. ---codeine, autoimmune skin disorder, Is this a behavioral health or substance abuse call? ---No Guidelines Guideline Title Affirmed Question Affirmed Notes Nurse Date/Time (Eastern Time) Rash or Redness - Widespread Face becomes swollen [place], RN, [name] 02/09/2023 9:41:51 AM Disp. Time [name] Time) Disposition Final User 02/09/2023  9:46:19 AM See HCP within 4 Hours (or PCP triage) Yes Hum[place]et, RN, [name] Final Disposition 02/09/2023 9:46:19 AM See HCP within 4 Hours (or PCP triage) Yes [place]et, RN, [name] Disagree/Comply Comply Caller Understands Yes PreDisposition Did not know what to do Care Advice Given Per Guideline SEE HCP (OR PCP TRIAGE) WITHIN 4 HOURS: * IF OFFICE WILL BE OPEN: You need to be seen within the next 3 or 4 hours. Call your doctor (or NP/PA) now or as soon as the office opens. CARE ADVICE given per Rash - Widespread and Cause Unknown (Adult) guideline. * You become worse CALL BACK IF:

## 2023-02-09 NOTE — Progress Notes (Signed)
Established Patient Office Visit  Subjective   Patient ID: Tina Pena, female    DOB: [birth year]  Age: 67 y.o. MRN: [id number]  Chief Complaint  Patient presents with   Rash    Under eyes, sxs started earlier this week, and states spots are spreading. Pt states having itching.     HPI Discussed the use of AI scribe software for clinical note transcription with the patient, who gave verbal consent to proceed.  History of Present Illness   The patient presents with a pruritic rash that started as 'three little spots' on the face and has since spread to the neck and torso. The rash is not painful and has not responded to over-the-counter treatments such as Benadryl and various lotions. The patient also reports swelling around the eyes, which may be affecting her vision. The patient has a history of dermatitis and recently increased their dose of hydroxychloroquine. They also recently used Floracil for precancerous spots on the skin, but these spots are not in the same location as the current rash. The patient denies any recent changes in soaps, detergents, or other potential allergens. They also deny any recent outdoor activities or animal exposures.      Patient Active Problem List   Diagnosis Date Noted   Age-related osteoporosis without fracture 08/07/2022   Tinnitus of left ear 07/05/2022   Decreased hearing of both ears 07/05/2022   Chronic constipation 02/21/2022   Gastroesophageal reflux disease 02/21/2022   Dysphagia 02/21/2022   Anal fissure 02/21/2022   Neck pain, chronic 10/20/2021   Cervical radiculopathy 10/20/2021   Calcific tendinitis of right shoulder 10/24/2018   MDD (major depressive disorder), recurrent episode, moderate (HCC) 01/12/2018   Vitamin D deficiency 05/11/2017   Alopecia 12/12/2016   Atrophy of vagina 12/12/2016   Depression 12/12/2016   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   Diverticulosis 12/12/2016   Fibromyalgia 12/12/2016   Gastritis  12/12/2016   H/O multiple allergies 12/12/2016   Herpes 12/12/2016   Iron deficiency 12/12/2016   Lichen simplex chronicus 12/12/2016   Migraines 12/12/2016   Mollusca contagiosa 12/12/2016   Past Medical History:  Diagnosis Date   Acid reflux    Allergy    Anal fissure    Arthritis    Barrett's esophagus    Bipolar disorder (HCC)    Cervical herniated disc    Chronic headaches    Chronic pain    Depression    Dermatomyositis (HCC)    GERD (gastroesophageal reflux disease)    H/O vitamin D deficiency    Hypercholesterolemia    IBS (irritable bowel syndrome)    Ocular migraine    Osteoporosis    Past Surgical History:  Procedure Laterality Date   APPENDECTOMY     ESOPHAGOGASTRIC FUNDOPLICATION     GALLBLADDER SURGERY  2001   RETINAL DETACHMENT SURGERY Right 11/29/2021   TONSILLECTOMY  1981   toupetfundoplicatio  2000   TUBAL LIGATION     Social History   Tobacco Use   Smoking status: Never   Smokeless tobacco: Never  Vaping Use   Vaping status: Never Used  Substance Use Topics   Alcohol use: Yes    Alcohol/week: 2.0 standard drinks of alcohol    Types: 2 Glasses of wine per week    Comment: occasional glass of wine   Drug use: Never   Social History   Socioeconomic History   Marital status: Married    Spouse name: Not on file   Number of children: 2   Years  of education: Not on file   Highest education level: Bachelor's degree (e.g., BA, AB, BS)  Occupational History   Occupation: retired  Tobacco Use   Smoking status: Never   Smokeless tobacco: Never  Vaping Use   Vaping status: Never Used  Substance and Sexual Activity   Alcohol use: Yes    Alcohol/week: 2.0 standard drinks of alcohol    Types: 2 Glasses of wine per week    Comment: occasional glass of wine   Drug use: Never   Sexual activity: Yes    Partners: Male    Birth control/protection: None  Other Topics Concern   Not on file  Social History Narrative   Lives with husband    Social Determinants of Health   Financial Resource Strain: Low Risk  (06/02/2022)   Overall Financial Resource Strain (CARDIA)    Difficulty of Paying Living Expenses: Not hard at all  Food Insecurity: Low Risk  (09/27/2022)   [facility]   Food vital sign    Within the past 12 months, you worried that your food would run out before you got money to buy more: Never true    Within the past 12 months, the food you bought just didn't last and you didn't have money to get more. : Never true  Transportation Needs: Not on file (09/27/2022)  Physical Activity: Sufficiently Active (06/02/2022)   Exercise Vital Sign    Days of Exercise per Week: 5 days    Minutes of Exercise per Session: 60 min  Stress: No Stress Concern Present (06/02/2022)   [employer] of Occupational Health - Occupational Stress Questionnaire    Feeling of Stress : Not at all  Social Connections: Moderately Integrated (06/02/2022)   Social Connection and Isolation Panel [NHANES]    Frequency of Communication with Friends and Family: Three times a week    Frequency of Social Gatherings with Friends and Family: More than three times a week    Attends Religious Services: Never    Database administrator or Organizations: Yes    Attends Banker Meetings: 1 to 4 times per year    Marital Status: Married  Catering manager Violence: Not At Risk (06/02/2022)   Humiliation, Afraid, Rape, and Kick questionnaire    Fear of Current or Ex-Partner: No    Emotionally Abused: No    Physically Abused: No    Sexually Abused: No   Family Status  Relation Name Status   Mother  Deceased   Father  Deceased   MGM  (Not Specified)   MGF  (Not Specified)   PGM  (Not Specified)   PGF  (Not Specified)   Neg Hx  (Not Specified)  No partnership data on file   Family History  Problem Relation Age of Onset   Cancer Mother    Lung cancer Mother    COPD Father    Cancer Father        breast    Bipolar disorder Father    Anxiety disorder Father    Depression Father    Paranoid behavior Father    Heart disease Father    Stroke Maternal Grandmother    Hypertension Maternal Grandmother    Bipolar disorder Maternal Grandmother    Dementia Maternal Grandmother    Hypertension Maternal Grandfather    Stroke Paternal Grandmother    Hypertension Paternal Grandmother    Bipolar disorder Paternal Grandmother    Dementia Paternal Grandmother    Hypertension  Paternal Grandfather    Stomach cancer Neg Hx    Colon cancer Neg Hx    Esophageal cancer Neg Hx    Allergies  Allergen Reactions   Codeine Nausea And Vomiting    Other reaction(s): extreme stomach upset, GI Intolerance   Garlic Nausea And Vomiting   Other Nausea And Vomiting    Onions    Sertraline Rash    Other reaction(s): Confusion Other reaction(s): confusion, Other (See Comments) Other reaction(s): Confusion    Methotrexate     Other reaction(s): elevated LFT's Other reaction(s): elevated LFT's       Review of Systems  Constitutional:  Negative for fever and malaise/fatigue.  HENT:  Negative for congestion.   Eyes:  Negative for blurred vision.  Respiratory:  Negative for cough and shortness of breath.   Cardiovascular:  Negative for chest pain, palpitations and leg swelling.  Gastrointestinal:  Negative for abdominal pain, blood in stool, nausea and vomiting.  Genitourinary:  Negative for dysuria and frequency.  Musculoskeletal:  Negative for back pain and falls.  Skin:  Positive for itching and rash.  Neurological:  Negative for dizziness, loss of consciousness and headaches.  Endo/Heme/Allergies:  Negative for environmental allergies.  Psychiatric/Behavioral:  Negative for depression. The patient is not nervous/anxious.       Objective:     BP 120/80 (BP Location: Left Arm, Patient Position: Sitting, Cuff Size: Normal)   Pulse 90   Temp 98.5 F (36.9 C) (Oral)   Resp 18   Ht 5' 7.5" (1.715 m)    Wt 158 lb 6.4 oz (71.8 kg)   SpO2 98%   BMI 24.44 kg/m  BP Readings from Last 3 Encounters:  02/09/23 120/80  01/15/23 120/72  08/23/22 133/76   Wt Readings from Last 3 Encounters:  02/09/23 158 lb 6.4 oz (71.8 kg)  08/23/22 161 lb (73 kg)  08/15/22 157 lb 3.2 oz (71.3 kg)   SpO2 Readings from Last 3 Encounters:  02/09/23 98%  08/15/22 100%  08/07/22 100%      Physical Exam Vitals and nursing note reviewed.  Constitutional:      General: She is not in acute distress.    Appearance: Normal appearance. She is well-developed.  HENT:     Head: Normocephalic and atraumatic.  Eyes:     General: No scleral icterus.       Right eye: No discharge.        Left eye: No discharge.     Comments: Both eyelids and under eye swollen and red   Cardiovascular:     Rate and Rhythm: Normal rate and regular rhythm.     Heart sounds: No murmur heard. Pulmonary:     Effort: Pulmonary effort is normal. No respiratory distress.     Breath sounds: Normal breath sounds.  Musculoskeletal:        General: Normal range of motion.     Cervical back: Normal range of motion and neck supple.     Right lower leg: No edema.     Left lower leg: No edema.  Skin:    General: Skin is warm and dry.     Findings: Erythema and rash present. Rash is crusting and macular.  Neurological:     Mental Status: She is alert and oriented to person, place, and time.  Psychiatric:        Mood and Affect: Mood normal.        Behavior: Behavior normal.  Thought Content: Thought content normal.        Judgment: Judgment normal.     No results found for any visits on 02/09/23.  Last CBC Lab Results  Component Value Date   WBC 6.5 05/30/2022   HGB 13.2 05/30/2022   HCT 39.3 05/30/2022   MCV 89.8 05/30/2022   MCH 29.4 03/03/2022   RDW 13.4 05/30/2022   PLT 254.0 05/30/2022   Last metabolic panel Lab Results  Component Value Date   GLUCOSE 101 (H) 05/30/2022   NA 138 05/30/2022   K 4.7  05/30/2022   CL 99 05/30/2022   CO2 31 05/30/2022   BUN 20 05/30/2022   CREATININE 0.87 05/30/2022   GFR 69.24 05/30/2022   CALCIUM 9.5 05/30/2022   PROT 7.2 05/30/2022   ALBUMIN 4.5 05/30/2022   LABGLOB 3.0 01/12/2018   AGRATIO 1.4 01/12/2018   BILITOT 0.4 05/30/2022   ALKPHOS 98 05/30/2022   AST 22 05/30/2022   ALT 23 05/30/2022   ANIONGAP 7 03/03/2022   Last lipids No results found for: "CHOL", "HDL", "LDLCALC", "LDLDIRECT", "TRIG", "CHOLHDL" Last hemoglobin A1c Lab Results  Component Value Date   HGBA1C 5.2 12/22/2021   Last thyroid functions Lab Results  Component Value Date   TSH 4.510 (H) 01/12/2018   Last vitamin D No results found for: "25OHVITD2", "25OHVITD3", "VD25OH" Last vitamin B12 and Folate No results found for: "VITAMINB12", "FOLATE"    The ASCVD Risk score (Arnett DK, et al., 2019) failed to calculate for the following reasons:   Cannot find a previous HDL lab   Cannot find a previous total cholesterol lab    Assessment & Plan:   Problem List Items Addressed This Visit   None Visit Diagnoses     Dermatitis    -  Primary   Relevant Medications   predniSONE (DELTASONE) 10 MG tablet   methylPREDNISolone acetate (DEPO-MEDROL) injection 80 mg (Completed)     Assessment and Plan    Generalized Pruritic Rash New onset, bilateral, involving face and torso. No clear inciting event or exposure. No response to over-the-counter treatments. Possible association with recent increase in hydroxychloroquine for dermatomyositis. -Administer prednisone injection today. -Prescribe oral prednisone to start tomorrow. -Continue Benadryl as needed for itching. -If no significant improvement in 24 hours, consider further workup including blood work and possible biopsy. -If rash recurs, consider allergy testing.  Dermatomyositis Stable, recently increased hydroxychloroquine. -Continue current management.  Chronic Skin Lesions History of bug bites months ago,  not currently itchy. -Continue current management.        No follow-ups on file.    Donato Schultz, DO

## 2023-02-09 NOTE — Telephone Encounter (Signed)
Noted and agreed, thank you. 

## 2023-02-09 NOTE — Telephone Encounter (Signed)
FYI: This call has been transferred to triage nurse: the Triage Nurse. Once the result note has been entered staff can address the message at that time.  Patient called in with the following symptoms:  Red Word: Swelling of eyes (mainly right eye) and face, red, itchy spots on hands, arms,torso (states may be having allergic reaction)   Please advise at Mobile [phone] (mobile)  Message is routed to Provider Pool.

## 2023-02-09 NOTE — Telephone Encounter (Signed)
Triage note 

## 2023-02-15 ENCOUNTER — Ambulatory Visit (INDEPENDENT_AMBULATORY_CARE_PROVIDER_SITE_OTHER): Payer: Medicare Other | Admitting: Gastroenterology

## 2023-02-15 ENCOUNTER — Encounter: Payer: Self-pay | Admitting: Gastroenterology

## 2023-02-15 VITALS — BP 122/80 | HR 72 | Ht 67.5 in | Wt 159.2 lb

## 2023-02-15 DIAGNOSIS — K602 Anal fissure, unspecified: Secondary | ICD-10-CM | POA: Diagnosis not present

## 2023-02-15 DIAGNOSIS — K5909 Other constipation: Secondary | ICD-10-CM

## 2023-02-15 DIAGNOSIS — Z1211 Encounter for screening for malignant neoplasm of colon: Secondary | ICD-10-CM

## 2023-02-15 MED ORDER — SUCRALFATE 1 GM/10ML PO SUSP: 1.0000 g | Freq: Two times a day (BID) | ORAL | 1 refills | Status: DC

## 2023-02-15 MED ORDER — AMBULATORY NON FORMULARY MEDICATION: 1 refills | Status: DC

## 2023-02-15 MED ORDER — LUBIPROSTONE 8 MCG PO CAPS: 8.0000 ug | ORAL_CAPSULE | Freq: Two times a day (BID) | ORAL | 3 refills | Status: DC

## 2023-02-15 MED ORDER — NA SULFATE-K SULFATE-MG SULF 17.5-3.13-1.6 GM/177ML PO SOLN: 1.0000 | Freq: Once | ORAL | 0 refills | Status: AC

## 2023-02-15 NOTE — Patient Instructions (Signed)
We have sent the following medications to your pharmacy for you to pick up at your convenience: Amitiza 8 mcg twice daily. Carafate 1 g tablet twice daily.   We have sent a prescription for Diltiazem 2% gel w/ Lidocaine 5% to [facility] for you. Using your index finger, you should apply a small amount of medication inside the rectum up to your first knuckle/joint three times daily x 6-8 weeks.  [facility]'s information is below: Address: [address] [place], [place] [address]  [phone]  *Please DO NOT go directly from our office to pick up this medication! Give the pharmacy 1 day to process the prescription as this is compounded and takes time to make.  You have been scheduled for a colonoscopy. Please follow written instructions given to you at your visit today.   Please pick up your prep supplies at the pharmacy within the next 1-3 days.  If you use inhalers (even only as needed), please bring them with you on the day of your procedure.  DO NOT TAKE 7 DAYS PRIOR TO TEST- Trulicity (dulaglutide) Ozempic, Wegovy (semaglutide) Mounjaro (tirzepatide) Bydureon Bcise (exanatide extended release)  DO NOT TAKE 1 DAY PRIOR TO YOUR TEST Rybelsus (semaglutide) Adlyxin (lixisenatide) Victoza (liraglutide) Byetta (exanatide) ___________________________________________________________________________  _______________________________________________________  If your blood pressure at your visit was 140/90 or greater, please contact your primary care physician to follow up on this.  _______________________________________________________  If you are age 70 or older, your body mass index should be between 23-30. Your Body mass index is 24.57 kg/m. If this is out of the aforementioned range listed, please consider follow up with your Primary Care Provider.  If you are age 60 or younger, your body mass index should be between 19-25. Your Body mass index is  24.57 kg/m. If this is out of the aformentioned range listed, please consider follow up with your Primary Care Provider.   ________________________________________________________  The [facility] providers would like to encourage you to use [facility] to communicate with providers for non-urgent requests or questions.  Due to long hold times on the telephone, sending your provider a message by [facility] may be a faster and more efficient way to get a response.  Please allow 48 business hours for a response.  Please remember that this is for non-urgent requests.  _______________________________________________________

## 2023-02-15 NOTE — Progress Notes (Signed)
I agree with the assessment and plan as outlined by Ms. Zehr. 

## 2023-02-15 NOTE — Progress Notes (Signed)
02/15/2023 Tina Pena [id number] 04-14-1956   HISTORY OF PRESENT ILLNESS: This is a 67 year old female who was seen by me once to establish care last year, exactly about a year ago.  Her care was assigned to Dr. Leonides Schanz.  She was seen for issues with acid reflux, chronic constipation, and anal fissure.  She had question of Barrett's esophagus, but underwent EGD here and that did not show Barrett's esophagus.  She is on pantoprazole 40 mg daily.  She had used Carafate as well in the past and is asking if I can renew that prescription so she can have it on hand when traveling, etc.  She is due for colonoscopy as her last one was in 2014 in [place].  She says that in regards to her constipation as previously stated that Movantik did not help, Linzess was too aggressive, MiraLAX she tried again after I saw her last time but says it caused her a lot of bloating.  She had an anal fissure when I saw her last year and we treated that with diltiazem with lidocaine.  She says that she has that for a while and it did get better, but with the constipation she believes she is aggravated that again.   Past Medical History:  Diagnosis Date   Acid reflux    Allergy    Anal fissure    Arthritis    Barrett's esophagus    Bipolar disorder (HCC)    Cervical herniated disc    Chronic headaches    Chronic pain    Depression    Dermatomyositis (HCC)    GERD (gastroesophageal reflux disease)    H/O vitamin D deficiency    Hypercholesterolemia    IBS (irritable bowel syndrome)    Ocular migraine    Osteoporosis    Past Surgical History:  Procedure Laterality Date   APPENDECTOMY     ESOPHAGOGASTRIC FUNDOPLICATION     GALLBLADDER SURGERY  2001   RETINAL DETACHMENT SURGERY Right 11/29/2021   TONSILLECTOMY  1981   toupetfundoplicatio  2000   TUBAL LIGATION      reports that she has never smoked. She has never used smokeless tobacco. She reports current alcohol use of about 2.0 standard drinks of  alcohol per week. She reports that she does not use drugs. family history includes Anxiety disorder in her father; Bipolar disorder in her father, maternal grandmother, and paternal grandmother; COPD in her father; Cancer in her father and mother; Dementia in her maternal grandmother and paternal grandmother; Depression in her father; Heart disease in her father; Hypertension in her maternal grandfather, maternal grandmother, paternal grandfather, and paternal grandmother; Lung cancer in her mother; Paranoid behavior in her father; Stroke in her maternal grandmother and paternal grandmother. Allergies  Allergen Reactions   Codeine Nausea And Vomiting    Other reaction(s): extreme stomach upset, GI Intolerance   Garlic Nausea And Vomiting   Other Nausea And Vomiting    Onions    Sertraline Rash    Other reaction(s): Confusion Other reaction(s): confusion, Other (See Comments) Other reaction(s): Confusion    Methotrexate     Other reaction(s): elevated LFT's Other reaction(s): elevated LFT's       Outpatient Encounter Medications as of 02/15/2023  Medication Sig   estradiol (ESTRACE) 0.1 MG/GM vaginal cream Place 0.5 g vaginally 2 (two) times a week. Place 0.5g nightly for two weeks then twice a week after   hydroxychloroquine (PLAQUENIL) 200 MG tablet Take 200 mg by mouth 2 (two)  times daily. 1 tab by mouth in AM, 1 tab by mouth in PM   inFLIXimab (REMICADE) 100 MG injection 7mg /kg   loratadine-pseudoephedrine (CLARITIN-D 24 HOUR) 10-240 MG 24 hr tablet 1 tablet as needed Orally Once a day for 30 day(s)   methenamine (HIPREX) 1 g tablet Take 1 tablet (1 g total) by mouth 2 (two) times daily with a meal.   Methenamine-Sodium Salicylate 162-162.5 MG TABS Take by mouth. Darrold Junker Urinary Care Tab   Multiple Vitamins-Minerals (MULTI ADULT GUMMIES PO) Take by mouth.   naratriptan (AMERGE) 2.5 MG tablet TAKE 1 TABLET BY MOUTH AT ONSET OF HEADACHE. IF RETURNS OR DOES NOT RESOLVE, MAY REPEAT AFTER 4  HOURS. DO NOT EXCEED 2 TABS IN 24 HOURS   pantoprazole (PROTONIX) 40 MG tablet TAKE 1 TABLET BY MOUTH EVERY DAY   predniSONE (DELTASONE) 10 MG tablet TAKE 3 TABLETS PO QD FOR 3 DAYS THEN TAKE 2 TABLETS PO QD FOR 3 DAYS THEN TAKE 1 TABLET PO QD FOR 3 DAYS THEN TAKE 1/2 TAB PO QD FOR 3 DAYS   Probiotic Product (PROBIOTIC BLEND PO) Take by mouth. Darrold Junker Prebiotic   sucralfate (CARAFATE) 1 GM/10ML suspension TAKE 10 MLS (1 G TOTAL) BY MOUTH 4 (FOUR) TIMES DAILY - WITH MEALS AND AT BEDTIME.   valACYclovir (VALTREX) 1000 MG tablet Take by mouth.   No facility-administered encounter medications on file as of 02/15/2023.    REVIEW OF SYSTEMS  : All other systems reviewed and negative except where noted in the History of Present Illness.   PHYSICAL EXAM: BP 122/80   Pulse 72   Ht 5' 7.5" (1.715 m)   Wt 159 lb 4 oz (72.2 kg)   SpO2 97%   BMI 24.57 kg/m  General: Well developed white female in no acute distress Head: Normocephalic and atraumatic Eyes:  Sclerae anicteric, conjunctiva pink. Ears: Normal auditory acuity Lungs: Clear throughout to auscultation; no W/R/R. Heart: Regular rate and rhythm; no M/R/G. Abdomen: Soft, non-distended.  BS present.  Non-tender. Rectal:  Will be done at the time of colonoscopy. Musculoskeletal: Symmetrical with no gross deformities  Skin: No lesions on visible extremities Extremities: No edema  Neurological: Alert oriented x 4, grossly non-focal Psychological:  Alert and cooperative. Normal mood and affect  ASSESSMENT AND PLAN: *CRC screening: Last colonoscopy in 2014 in [place].  Will schedule Dr. Leonides Schanz.  The risks, benefits, and alternatives to colonoscopy were discussed with the patient and she consents to proceed.  *Constipation:  Movantik did not seem to work, MiraLAX causes her a lot of bloating, Linzess worked too well for her and caused her diarrhea.  Will try Amitiza, prescribing 8 mcg twice daily, but she can certainly try it once daily to start  with.  Prescription sent to pharmacy. *Anal fissure: Used diltiazem with lidocaine last year and had improvement, but then with constipation she thinks it is aggravated again.  Will represcribe that as well.  Prescription sent to pharmacy. *GERD: On pantoprazole 40 mg daily.  She has used Carafate in the past and is asking if I can renew that for her so that she can have it on hand when she travels, etc. if she gets any flare of her symptoms.  Prescription sent to pharmacy.   CC:  Allwardt, Crist Infante, PA-C

## 2023-02-22 ENCOUNTER — Ambulatory Visit: Payer: Medicare Other | Admitting: Obstetrics and Gynecology

## 2023-02-23 ENCOUNTER — Ambulatory Visit (INDEPENDENT_AMBULATORY_CARE_PROVIDER_SITE_OTHER): Payer: Medicare Other | Admitting: Family

## 2023-02-23 ENCOUNTER — Ambulatory Visit: Payer: Medicare Other | Admitting: Physician Assistant

## 2023-02-23 VITALS — BP 132/84 | HR 80 | Temp 97.7°F | Ht 67.5 in | Wt 158.2 lb

## 2023-02-23 DIAGNOSIS — R21 Rash and other nonspecific skin eruption: Secondary | ICD-10-CM | POA: Diagnosis not present

## 2023-02-23 MED ORDER — METHYLPREDNISOLONE ACETATE 80 MG/ML IJ SUSP
80.0000 mg | Freq: Once | INTRAMUSCULAR | Status: AC
Start: 2023-02-23 — End: 2023-02-23
  Administered 2023-02-23: 80 mg via INTRAMUSCULAR

## 2023-02-23 MED ORDER — PREDNISONE 10 MG PO TABS: 10.0000 mg | ORAL_TABLET | ORAL | 0 refills | Status: DC

## 2023-02-23 NOTE — Progress Notes (Signed)
Patient ID: Tina Pena, female    DOB: 1955/08/10, 67 y.o.   MRN: [id number]  Chief Complaint  Patient presents with   Follow-up    Allergic reaction, Pt had a an allergic reaction 2 weeks ago. Pt still has rashes and red bumps on bilateral arms and legs.     HPI:      Skin rash:    Allergic reaction, Pt had a an allergic reaction 2 weeks ago. Pt still has rashes and red bumps on bilateral arms and legs. Pt seen by another provider 2 w ago and given steroid shot & prednisone x 9d, finished last Wednesday. Reports having used multiple steroid creams for past rashes, not effective. Taking Hydroxyzine for itching. Overall feeling better, but not resolved and still itching, and with her autoimmune diseases she is concerned if it will continue for long time or worsen.   Assessment & Plan:  1. Skin rash giving steroid injection and another round of prednisone due to office closed next 3 days, but advised to wait and see if injection will be enough, may not need pills. Advised on trying OTC oatmeal powder (aveeno) bath for itching prn. RTO if still no resolved in another week.  - methylPREDNISolone acetate (DEPO-MEDROL) injection 80 mg - predniSONE (DELTASONE) 10 MG tablet; Take 1 tablet (10 mg total) by mouth as directed. Take after breakfast:  3 pills x 3d, then 2 pills x 3d, then 1 pill x 3d.  Dispense: 18 tablet; Refill: 0  Subjective:    Outpatient Medications Prior to Visit  Medication Sig Dispense Refill   AMBULATORY NON FORMULARY MEDICATION Medication Name: Diltiazem 2% cream w/Lidocaine 5 % cream - Using your index finger, apply a small amount of medication inside the rectum up to your first knuckle/joint three times daily x 6-8 weeks. 30 g 1   estradiol (ESTRACE) 0.1 MG/GM vaginal cream Place 0.5 g vaginally 2 (two) times a week. Place 0.5g nightly for two weeks then twice a week after 30 g 11   hydroxychloroquine (PLAQUENIL) 200 MG tablet Take 200 mg by mouth 2 (two) times daily.  1 tab by mouth in AM, 1 tab by mouth in PM     inFLIXimab (REMICADE) 100 MG injection 7mg /kg     loratadine-pseudoephedrine (CLARITIN-D 24 HOUR) 10-240 MG 24 hr tablet 1 tablet as needed Orally Once a day for 30 day(s)     lubiprostone (AMITIZA) 8 MCG capsule Take 1 capsule (8 mcg total) by mouth 2 (two) times daily with a meal. 60 capsule 3   methenamine (HIPREX) 1 g tablet Take 1 tablet (1 g total) by mouth 2 (two) times daily with a meal. 180 tablet 3   Multiple Vitamins-Minerals (MULTI ADULT GUMMIES PO) Take by mouth.     naratriptan (AMERGE) 2.5 MG tablet TAKE 1 TABLET BY MOUTH AT ONSET OF HEADACHE. IF RETURNS OR DOES NOT RESOLVE, MAY REPEAT AFTER 4 HOURS. DO NOT EXCEED 2 TABS IN 24 HOURS 9 tablet 5   pantoprazole (PROTONIX) 40 MG tablet TAKE 1 TABLET BY MOUTH EVERY DAY 90 tablet 1   Probiotic Product (PROBIOTIC BLEND PO) Take by mouth. Darrold Junker Prebiotic     sucralfate (CARAFATE) 1 GM/10ML suspension Take 10 mLs (1 g total) by mouth 2 (two) times daily. 60 mL 1   valACYclovir (VALTREX) 1000 MG tablet Take by mouth.     Methenamine-Sodium Salicylate 162-162.5 MG TABS Take by mouth. Darrold Junker Urinary Care Tab     predniSONE (DELTASONE) 10 MG tablet  TAKE 3 TABLETS PO QD FOR 3 DAYS THEN TAKE 2 TABLETS PO QD FOR 3 DAYS THEN TAKE 1 TABLET PO QD FOR 3 DAYS THEN TAKE 1/2 TAB PO QD FOR 3 DAYS 20 tablet 0   No facility-administered medications prior to visit.   Past Medical History:  Diagnosis Date   Acid reflux    Allergy    Anal fissure    Arthritis    Barrett's esophagus    Bipolar disorder (HCC)    Cervical herniated disc    Chronic headaches    Chronic pain    Depression    Dermatomyositis (HCC)    GERD (gastroesophageal reflux disease)    H/O vitamin D deficiency    Hypercholesterolemia    IBS (irritable bowel syndrome)    Ocular migraine    Osteoporosis    Past Surgical History:  Procedure Laterality Date   APPENDECTOMY     ESOPHAGOGASTRIC FUNDOPLICATION     GALLBLADDER SURGERY   2001   RETINAL DETACHMENT SURGERY Right 11/29/2021   TONSILLECTOMY  1981   toupetfundoplicatio  2000   TUBAL LIGATION     Allergies  Allergen Reactions   Codeine Nausea And Vomiting    Other reaction(s): extreme stomach upset, GI Intolerance   Garlic Nausea And Vomiting   Other Nausea And Vomiting    Onions    Sertraline Rash    Other reaction(s): Confusion Other reaction(s): confusion, Other (See Comments) Other reaction(s): Confusion    Methotrexate     Other reaction(s): elevated LFT's Other reaction(s): elevated LFT's       Objective:    Physical Exam Vitals and nursing note reviewed.  Constitutional:      Appearance: Normal appearance.  Cardiovascular:     Rate and Rhythm: Normal rate and regular rhythm.  Pulmonary:     Effort: Pulmonary effort is normal.     Breath sounds: Normal breath sounds.  Musculoskeletal:        General: Normal range of motion.  Skin:    General: Skin is warm and dry.     Findings: Lesion (multiple bug bites, red bumps, on bilateral lower legs) and rash (small red bumps on bilateral arms) present.  Neurological:     Mental Status: She is alert.  Psychiatric:        Mood and Affect: Mood normal.        Behavior: Behavior normal.    BP 132/84   Pulse 80   Temp 97.7 F (36.5 C) (Temporal)   Ht 5' 7.5" (1.715 m)   Wt 158 lb 3.2 oz (71.8 kg)   SpO2 98%   BMI 24.41 kg/m  Wt Readings from Last 3 Encounters:  02/23/23 158 lb 3.2 oz (71.8 kg)  02/15/23 159 lb 4 oz (72.2 kg)  02/09/23 158 lb 6.4 oz (71.8 kg)       Dulce Sellar, NP

## 2023-03-07 HISTORY — PX: NERVE SURGERY: SHX1016

## 2023-03-10 ENCOUNTER — Other Ambulatory Visit: Payer: Self-pay | Admitting: Gastroenterology

## 2023-03-23 ENCOUNTER — Encounter: Payer: Self-pay | Admitting: Physician Assistant

## 2023-03-23 ENCOUNTER — Other Ambulatory Visit: Payer: Self-pay | Admitting: Physician Assistant

## 2023-03-23 ENCOUNTER — Telehealth: Payer: Self-pay | Admitting: Physician Assistant

## 2023-03-23 ENCOUNTER — Ambulatory Visit (INDEPENDENT_AMBULATORY_CARE_PROVIDER_SITE_OTHER): Payer: Medicare Other | Admitting: Physician Assistant

## 2023-03-23 VITALS — BP 122/72 | HR 82 | Temp 97.7°F | Ht 67.5 in

## 2023-03-23 DIAGNOSIS — R7989 Other specified abnormal findings of blood chemistry: Secondary | ICD-10-CM | POA: Diagnosis not present

## 2023-03-23 DIAGNOSIS — Z13220 Encounter for screening for lipoid disorders: Secondary | ICD-10-CM | POA: Diagnosis not present

## 2023-03-23 DIAGNOSIS — N390 Urinary tract infection, site not specified: Secondary | ICD-10-CM

## 2023-03-23 DIAGNOSIS — E038 Other specified hypothyroidism: Secondary | ICD-10-CM

## 2023-03-23 DIAGNOSIS — M359 Systemic involvement of connective tissue, unspecified: Secondary | ICD-10-CM | POA: Insufficient documentation

## 2023-03-23 DIAGNOSIS — R21 Rash and other nonspecific skin eruption: Secondary | ICD-10-CM

## 2023-03-23 DIAGNOSIS — M542 Cervicalgia: Secondary | ICD-10-CM

## 2023-03-23 DIAGNOSIS — G629 Polyneuropathy, unspecified: Secondary | ICD-10-CM | POA: Insufficient documentation

## 2023-03-23 DIAGNOSIS — F419 Anxiety disorder, unspecified: Secondary | ICD-10-CM | POA: Insufficient documentation

## 2023-03-23 DIAGNOSIS — R809 Proteinuria, unspecified: Secondary | ICD-10-CM | POA: Diagnosis not present

## 2023-03-23 DIAGNOSIS — L299 Pruritus, unspecified: Secondary | ICD-10-CM | POA: Diagnosis not present

## 2023-03-23 DIAGNOSIS — G8929 Other chronic pain: Secondary | ICD-10-CM

## 2023-03-23 LAB — CBC WITH DIFFERENTIAL/PLATELET
Basophils Absolute: 0 10*3/uL (ref 0.0–0.1)
Basophils Relative: 0.8 % (ref 0.0–3.0)
Eosinophils Absolute: 0.4 10*3/uL (ref 0.0–0.7)
Eosinophils Relative: 7.1 % — ABNORMAL HIGH (ref 0.0–5.0)
HCT: 38.1 % (ref 36.0–46.0)
Hemoglobin: 12.2 g/dL (ref 12.0–15.0)
Lymphocytes Relative: 18.8 % (ref 12.0–46.0)
Lymphs Abs: 1 10*3/uL (ref 0.7–4.0)
MCHC: 32.1 g/dL (ref 30.0–36.0)
MCV: 90.9 fL (ref 78.0–100.0)
Monocytes Absolute: 0.4 10*3/uL (ref 0.1–1.0)
Monocytes Relative: 6.9 % (ref 3.0–12.0)
Neutro Abs: 3.6 10*3/uL (ref 1.4–7.7)
Neutrophils Relative %: 66.4 % (ref 43.0–77.0)
Platelets: 125 10*3/uL — ABNORMAL LOW (ref 150.0–400.0)
RBC: 4.19 Mil/uL (ref 3.87–5.11)
RDW: 13.4 % (ref 11.5–15.5)
WBC: 5.4 10*3/uL (ref 4.0–10.5)

## 2023-03-23 LAB — COMPREHENSIVE METABOLIC PANEL
ALT: 26 U/L (ref 0–35)
AST: 23 U/L (ref 0–37)
Albumin: 4 g/dL (ref 3.5–5.2)
Alkaline Phosphatase: 69 U/L (ref 39–117)
BUN: 19 mg/dL (ref 6–23)
CO2: 27 meq/L (ref 19–32)
Calcium: 8.6 mg/dL (ref 8.4–10.5)
Chloride: 103 meq/L (ref 96–112)
Creatinine, Ser: 0.65 mg/dL (ref 0.40–1.20)
GFR: 90.98 mL/min (ref >60.00)
Glucose, Bld: 80 mg/dL (ref 70–99)
Potassium: 4.1 meq/L (ref 3.5–5.1)
Sodium: 136 meq/L (ref 135–145)
Total Bilirubin: 0.3 mg/dL (ref 0.2–1.2)
Total Protein: 6.4 g/dL (ref 6.0–8.3)

## 2023-03-23 LAB — LIPID PANEL
Cholesterol: 196 mg/dL (ref 0–200)
HDL: 72.4 mg/dL (ref >39.00)
LDL Cholesterol: 96 mg/dL (ref 0–99)
NonHDL: 124.01
Total CHOL/HDL Ratio: 3
Triglycerides: 142 mg/dL (ref 0.0–149.0)
VLDL: 28.4 mg/dL (ref 0.0–40.0)

## 2023-03-23 LAB — URINALYSIS, ROUTINE W REFLEX MICROSCOPIC
Bilirubin Urine: NEGATIVE
Hgb urine dipstick: NEGATIVE
Ketones, ur: NEGATIVE
Leukocytes,Ua: NEGATIVE
Nitrite: NEGATIVE
RBC / HPF: NONE SEEN (ref 0–?)
Specific Gravity, Urine: 1.025 (ref 1.000–1.030)
Total Protein, Urine: NEGATIVE
Urine Glucose: NEGATIVE
Urobilinogen, UA: 0.2 (ref 0.0–1.0)
pH: 6 (ref 5.0–8.0)

## 2023-03-23 LAB — TSH: TSH: 2.69 u[IU]/mL (ref 0.35–5.50)

## 2023-03-23 LAB — MICROALBUMIN / CREATININE URINE RATIO
Creatinine,U: 113.8 mg/dL
Microalb Creat Ratio: 0.6 mg/g (ref 0.0–30.0)
Microalb, Ur: <0.7 mg/dL (ref 0.0–1.9)

## 2023-03-23 NOTE — Patient Instructions (Addendum)
Stop the Methenamine at this time - I think this is causing the rash.  You can use the hydroxyzine as directed to help with itching. Keep cool & hydrated.   Let me know by Friday next week with an update.   Labs and urine check today.

## 2023-03-23 NOTE — Telephone Encounter (Signed)
Please see msg from pt in office today and advise

## 2023-03-23 NOTE — Telephone Encounter (Signed)
Patient called stating she forgot to ask PCP during OV today if she could get a refill of her tramadol. States she is out. Please Advise.

## 2023-03-23 NOTE — Progress Notes (Unsigned)
Subjective:    Patient ID: Tina Pena, female    DOB: 1956-02-14, 67 y.o.   MRN: [id number]  Chief Complaint  Patient presents with   Medical Management of Chronic Issues    Pt in office for f/u with PCP; had allergic reaction early August seen in [facility] due to PCP being booked, got prednisone Rx and injection in the office; pt still not sure what the reaction was to; pt is starting to break out again and has itching from time to time; only arms legs and face;    HPI Patient is in today for f/up on allergic reaction.   02/03/23 symptoms started - derm gave her triamcinolone cream  Fluoroacil used it once - then three days later broke out face  Steroid shot and prescription did help within a few days. Cleared up around 02/23/23.  Now spots and itching starting up again on her arms, neck, chest.  Lower legs has numerous bug bites from 6 months ago that are still not healing well.   Bruising from recent arm nerve surgery.  Concerned something else maybe going on.   Methenamine is a new medication end of July for UTI prevention. Says this is working well to prevent UTI.   Past Medical History:  Diagnosis Date   Acid reflux    Allergy    Anal fissure    Arthritis    Barrett's esophagus    Bipolar disorder (HCC)    Cervical herniated disc    Chronic headaches    Chronic pain    Depression    Dermatomyositis (HCC)    GERD (gastroesophageal reflux disease)    H/O vitamin D deficiency    Hypercholesterolemia    IBS (irritable bowel syndrome)    Ocular migraine    Osteoporosis     Past Surgical History:  Procedure Laterality Date   APPENDECTOMY     ESOPHAGOGASTRIC FUNDOPLICATION     GALLBLADDER SURGERY  2001   RETINAL DETACHMENT SURGERY Right 11/29/2021   TONSILLECTOMY  1981   toupetfundoplicatio  2000   TUBAL LIGATION      Family History  Problem Relation Age of Onset   Cancer Mother    Lung cancer Mother    COPD Father    Cancer Father         breast   Bipolar disorder Father    Anxiety disorder Father    Depression Father    Paranoid behavior Father    Heart disease Father    Stroke Maternal Grandmother    Hypertension Maternal Grandmother    Bipolar disorder Maternal Grandmother    Dementia Maternal Grandmother    Hypertension Maternal Grandfather    Stroke Paternal Grandmother    Hypertension Paternal Grandmother    Bipolar disorder Paternal Grandmother    Dementia Paternal Grandmother    Hypertension Paternal Grandfather    Stomach cancer Neg Hx    Colon cancer Neg Hx    Esophageal cancer Neg Hx     Social History   Tobacco Use   Smoking status: Never   Smokeless tobacco: Never  Vaping Use   Vaping status: Never Used  Substance Use Topics   Alcohol use: Yes    Alcohol/week: 2.0 standard drinks of alcohol    Types: 2 Glasses of wine per week    Comment: occasional glass of wine   Drug use: Never     Allergies  Allergen Reactions   Codeine Nausea And Vomiting    Other reaction(s): extreme  stomach upset, GI Intolerance   Garlic Nausea And Vomiting   Other Nausea And Vomiting    Onions    Sertraline Rash    Other reaction(s): Confusion Other reaction(s): confusion, Other (See Comments) Other reaction(s): Confusion    Methotrexate     Other reaction(s): elevated LFT's Other reaction(s): elevated LFT's     Review of Systems NEGATIVE UNLESS OTHERWISE INDICATED IN HPI      Objective:     BP 122/72 (BP Location: Left Arm)   Pulse 82   Temp 97.7 F (36.5 C) (Temporal)   Ht 5' 7.5" (1.715 m)   SpO2 98%   BMI 24.41 kg/m   Wt Readings from Last 3 Encounters:  02/23/23 158 lb 3.2 oz (71.8 kg)  02/15/23 159 lb 4 oz (72.2 kg)  02/09/23 158 lb 6.4 oz (71.8 kg)    BP Readings from Last 3 Encounters:  03/23/23 122/72  02/23/23 132/84  02/15/23 122/80     Physical Exam Vitals and nursing note reviewed.  Constitutional:      Appearance: Normal appearance.  Eyes:     Extraocular  Movements: Extraocular movements intact.     Conjunctiva/sclera: Conjunctivae normal.     Pupils: Pupils are equal, round, and reactive to light.  Cardiovascular:     Rate and Rhythm: Normal rate and regular rhythm.     Pulses: Normal pulses.     Heart sounds: Normal heart sounds. No murmur heard. Pulmonary:     Effort: Pulmonary effort is normal.     Breath sounds: Normal breath sounds.  Skin:    Findings: Rash present.  Neurological:     General: No focal deficit present.     Mental Status: She is alert and oriented to person, place, and time.  Psychiatric:        Mood and Affect: Mood normal.    Urticarial like patches bilateral forearms and chest    Non-itching lesions on lower legs from earlier this summer - pt calls them bug bites that have not improved in color yet      Assessment & Plan:  Skin rash -     CBC with Differential/Platelet -     Comprehensive metabolic panel  Itching -     CBC with Differential/Platelet -     Comprehensive metabolic panel  Proteinuria, unspecified type -     Comprehensive metabolic panel -     Microalbumin / creatinine urine ratio -     Urinalysis, Routine w reflex microscopic  Recurrent UTI -     Comprehensive metabolic panel -     Microalbumin / creatinine urine ratio -     Urinalysis, Routine w reflex microscopic  Elevated TSH -     TSH  Other specified hypothyroidism -     TSH  Screening for cholesterol level -     Lipid panel  Neck pain, chronic Assessment & Plan: PDMP reviewed today, no red flags, filling appropriately.  Currently stable on tramadol 50 mg 3 times daily.  Refilled this medication today.  She will follow-up with PT as needed.  Orders: -     traMADol HCl; Take 1 tablet (50 mg total) by mouth every 8 (eight) hours as needed.  Dispense: 90 tablet; Refill: 0   Stop the Methenamine at this time - I think this is causing the rash.  You can use the hydroxyzine as directed to help with itching. Keep cool  & hydrated.   Let me know by Friday next week  with an update.   Labs and urine check today.     F/up prn    Teria Khachatryan M Kamirah Shugrue, PA-C

## 2023-03-24 ENCOUNTER — Other Ambulatory Visit: Payer: Self-pay | Admitting: Physician Assistant

## 2023-03-26 ENCOUNTER — Other Ambulatory Visit: Payer: Self-pay

## 2023-03-26 DIAGNOSIS — D696 Thrombocytopenia, unspecified: Secondary | ICD-10-CM

## 2023-03-26 MED ORDER — TRAMADOL HCL 50 MG PO TABS
50.0000 mg | ORAL_TABLET | Freq: Three times a day (TID) | ORAL | 0 refills | Status: DC | PRN
Start: 2023-03-26 — End: 2023-04-25

## 2023-03-26 NOTE — Assessment & Plan Note (Signed)
PDMP reviewed today, no red flags, filling appropriately.  Currently stable on tramadol 50 mg 3 times daily.  Refilled this medication today.  She will follow-up with PT as needed.

## 2023-03-27 ENCOUNTER — Ambulatory Visit: Payer: Medicare Other | Admitting: Physician Assistant

## 2023-03-27 ENCOUNTER — Encounter: Payer: Self-pay | Admitting: Physician Assistant

## 2023-03-27 VITALS — BP 148/80 | HR 90 | Temp 97.5°F | Ht 67.5 in | Wt 159.8 lb

## 2023-03-27 DIAGNOSIS — R21 Rash and other nonspecific skin eruption: Secondary | ICD-10-CM | POA: Diagnosis not present

## 2023-03-27 DIAGNOSIS — D696 Thrombocytopenia, unspecified: Secondary | ICD-10-CM

## 2023-03-27 DIAGNOSIS — L299 Pruritus, unspecified: Secondary | ICD-10-CM

## 2023-03-27 LAB — CBC WITH DIFFERENTIAL/PLATELET
Basophils Absolute: 0.1 10*3/uL (ref 0.0–0.1)
Basophils Relative: 1.2 % (ref 0.0–3.0)
Eosinophils Absolute: 0.1 10*3/uL (ref 0.0–0.7)
Eosinophils Relative: 3.3 % (ref 0.0–5.0)
HCT: 38.7 % (ref 36.0–46.0)
Hemoglobin: 12.6 g/dL (ref 12.0–15.0)
Lymphocytes Relative: 25.2 % (ref 12.0–46.0)
Lymphs Abs: 1 10*3/uL (ref 0.7–4.0)
MCHC: 32.5 g/dL (ref 30.0–36.0)
MCV: 89 fL (ref 78.0–100.0)
Monocytes Absolute: 0.2 10*3/uL (ref 0.1–1.0)
Monocytes Relative: 5.9 % (ref 3.0–12.0)
Neutro Abs: 2.7 10*3/uL (ref 1.4–7.7)
Neutrophils Relative %: 64.4 % (ref 43.0–77.0)
Platelets: 288 10*3/uL (ref 150.0–400.0)
RBC: 4.35 Mil/uL (ref 3.87–5.11)
RDW: 12.9 % (ref 11.5–15.5)
WBC: 4.1 10*3/uL (ref 4.0–10.5)

## 2023-03-27 MED ORDER — METHYLPREDNISOLONE ACETATE 80 MG/ML IJ SUSP
80.0000 mg | Freq: Once | INTRAMUSCULAR | Status: AC
Start: 2023-03-27 — End: 2023-03-27
  Administered 2023-03-27: 80 mg via INTRAMUSCULAR

## 2023-03-27 NOTE — Progress Notes (Signed)
Subjective:    Patient ID: Tina Pena, female    DOB: 06-Oct-1955, 67 y.o.   MRN: [id number]  Chief Complaint  Patient presents with   Rash    Pt in office still c/o itching and thinks she needs a steroid injection; pt also needs to complete repeat labs while in the office. Pt taking hydroxyzine at night still waking up itching; spreading on arms;     HPI Patient is in today for follow-up from office visit on 03/23/2023.  Patient is requesting a steroid injection, stating that the rash is continuing to itch and spread on her arms.  She is taking hydroxyzine at night, but still waking up and very itchy.  She is also needing labs repeated.  No other changes or concerns today.  Past Medical History:  Diagnosis Date   Acid reflux    Allergy    Anal fissure    Arthritis    Barrett's esophagus    Bipolar disorder (HCC)    Cervical herniated disc    Chronic headaches    Chronic pain    Depression    Dermatomyositis (HCC)    GERD (gastroesophageal reflux disease)    H/O vitamin D deficiency    Hypercholesterolemia    IBS (irritable bowel syndrome)    Ocular migraine    Osteoporosis     Past Surgical History:  Procedure Laterality Date   APPENDECTOMY     ESOPHAGOGASTRIC FUNDOPLICATION     GALLBLADDER SURGERY  2001   RETINAL DETACHMENT SURGERY Right 11/29/2021   TONSILLECTOMY  1981   toupetfundoplicatio  2000   TUBAL LIGATION      Family History  Problem Relation Age of Onset   Cancer Mother    Lung cancer Mother    COPD Father    Cancer Father        breast   Bipolar disorder Father    Anxiety disorder Father    Depression Father    Paranoid behavior Father    Heart disease Father    Stroke Maternal Grandmother    Hypertension Maternal Grandmother    Bipolar disorder Maternal Grandmother    Dementia Maternal Grandmother    Hypertension Maternal Grandfather    Stroke Paternal Grandmother    Hypertension Paternal Grandmother    Bipolar disorder Paternal  Grandmother    Dementia Paternal Grandmother    Hypertension Paternal Grandfather    Stomach cancer Neg Hx    Colon cancer Neg Hx    Esophageal cancer Neg Hx     Social History   Tobacco Use   Smoking status: Never   Smokeless tobacco: Never  Vaping Use   Vaping status: Never Used  Substance Use Topics   Alcohol use: Yes    Alcohol/week: 2.0 standard drinks of alcohol    Types: 2 Glasses of wine per week    Comment: occasional glass of wine   Drug use: Never     Allergies  Allergen Reactions   Codeine Nausea And Vomiting    Other reaction(s): extreme stomach upset, GI Intolerance   Garlic Nausea And Vomiting   Other Nausea And Vomiting    Onions    Sertraline Rash    Other reaction(s): Confusion Other reaction(s): confusion, Other (See Comments) Other reaction(s): Confusion    Methotrexate     Other reaction(s): elevated LFT's Other reaction(s): elevated LFT's     Review of Systems NEGATIVE UNLESS OTHERWISE INDICATED IN HPI      Objective:     BP Marland Kitchen)  148/80 (BP Location: Left Arm)   Pulse 90   Temp (!) 97.5 F (36.4 C) (Temporal)   Ht 5' 7.5" (1.715 m)   Wt 159 lb 12.8 oz (72.5 kg)   SpO2 97%   BMI 24.66 kg/m   Wt Readings from Last 3 Encounters:  03/27/23 159 lb 12.8 oz (72.5 kg)  02/23/23 158 lb 3.2 oz (71.8 kg)  02/15/23 159 lb 4 oz (72.2 kg)    BP Readings from Last 3 Encounters:  03/27/23 (!) 148/80  03/23/23 122/72  02/23/23 132/84     Physical Exam Vitals and nursing note reviewed.  Constitutional:      Appearance: Normal appearance.  Cardiovascular:     Rate and Rhythm: Normal rate and regular rhythm.  Pulmonary:     Effort: Pulmonary effort is normal.     Breath sounds: Normal breath sounds.  Skin:    Findings: Rash (forearms - similar in appearance to last visit) present.  Neurological:     General: No focal deficit present.     Mental Status: She is alert and oriented to person, place, and time.  Psychiatric:        Mood  and Affect: Mood normal.        Behavior: Behavior normal.        Assessment & Plan:  Low platelet count (HCC) -     CBC with Differential/Platelet  Skin rash -     methylPREDNISolone Acetate -     Ambulatory referral to Allergy  Itching -     methylPREDNISolone Acetate -     Ambulatory referral to Allergy    She has stopped the methenamine, rash has continued over the weekend.  Unsure if methenamine has been the cause or not.  Plan to refer to allergy.  She is also going to follow-up with dermatology for evaluation. Depomedrol 80 mg IM provided in office today.  She can continue to use the hydroxyzine at home as needed.  She will keep cool and hydrated.  Plan to repeat her CBC today as her last platelet count was slightly lower than normal for her.   No other symptoms or concerns to address today.    F/up prn      Biridiana Twardowski M Marcquis Ridlon, PA-C

## 2023-03-28 ENCOUNTER — Encounter: Payer: Self-pay | Admitting: Certified Registered Nurse Anesthetist

## 2023-03-28 ENCOUNTER — Telehealth: Payer: Self-pay | Admitting: Internal Medicine

## 2023-03-28 ENCOUNTER — Ambulatory Visit: Payer: Medicare Other | Admitting: Adult Health

## 2023-03-28 DIAGNOSIS — Z1211 Encounter for screening for malignant neoplasm of colon: Secondary | ICD-10-CM

## 2023-03-28 MED ORDER — ONDANSETRON HCL 4 MG PO TABS
4.0000 mg | ORAL_TABLET | ORAL | 0 refills | Status: DC
Start: 2023-03-28 — End: 2023-05-17

## 2023-03-28 NOTE — Telephone Encounter (Signed)
Returned pts call and spoke with husband.  Pt is scheduled for colonoscopy tomorrow 10/3.  She started her 2 day prep yesterday evening.  She tolerated the prep while drinking it but threw up at 3:00 am this morning.  She threw up multiple times. She is not sure if this is prep related or if she has a virus.   She does not feel like she can prep for procedure at this time. Rescheduled with husband for 10/17 at 930.  Will send in Zofran to take next time prior to prep.  Will also send new prep instructions by my chart.

## 2023-03-28 NOTE — Telephone Encounter (Signed)
Patents husband called stating the patient has been vomiting since last night. Please advise.

## 2023-03-29 ENCOUNTER — Encounter: Payer: Medicare Other | Admitting: Internal Medicine

## 2023-04-02 ENCOUNTER — Ambulatory Visit (INDEPENDENT_AMBULATORY_CARE_PROVIDER_SITE_OTHER): Payer: Medicare Other | Admitting: Adult Health

## 2023-04-02 ENCOUNTER — Encounter: Payer: Self-pay | Admitting: Adult Health

## 2023-04-02 ENCOUNTER — Telehealth: Payer: Self-pay | Admitting: Adult Health

## 2023-04-02 ENCOUNTER — Ambulatory Visit: Payer: Medicare Other | Admitting: Physician Assistant

## 2023-04-02 VITALS — BP 121/72 | HR 83 | Ht 68.0 in | Wt 157.0 lb

## 2023-04-02 DIAGNOSIS — G43701 Chronic migraine without aura, not intractable, with status migrainosus: Secondary | ICD-10-CM

## 2023-04-02 NOTE — Patient Instructions (Addendum)
Your Plan:  We will see if botox can be approved for migraine prevention   We may need to try monthly injectable medications prior but we will keep you updated!  Continue naratriptan for migraine rescue      Follow up in 6 months or call earlier if needed      Thank you for coming to see [place] at [facility]. I hope we have been able to provide you high quality care today.  You may receive a patient satisfaction survey over the next few weeks. We would appreciate your feedback and comments so that we may continue to improve ourselves and the health of our patients.

## 2023-04-02 NOTE — Progress Notes (Signed)
CC:  headaches  Follow-up Visit  Last visit: 03/13/2022 with Dr. 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 [facility] 03/2021 was reportedly normal per patient.  At her last visit, reported improvement of migraines and cervicalgia after working with PT and dry needling, only 1 migraine per month. Continued on naratriptan for rescue.    Interval History:   Reports gradual worsening of migraines over the past 3 months, more recently experiencing mild migraines almost daily, located behind left eye. Will experience a severe debilitating migraine about once per month, use of naratriptan with benefit but did have a migraine last week that woke her up that did not resolve with naratriptan, lasted about 24 hrs. Also used zofran with benefit. Use of magnesium occasionally. Does have continued neck pain but denies any recent worsening.    Headache days per month: 30  Headache free days per month: 0  Current Headache Regimen: Preventative: none Abortive: Naratriptan   Prior Therapies                                  Imitrex Maxalt 10 mg PRN - lack of efficacy Zomig (pill and dissolvable tablet) Naratriptan Nurtec - helped, not covered by insurance Reglan Gabapentin 300 mg QHS - side effects Topamax - side effects  Vilazodone 40 mg daily Propranolol  - side effects Aimovig - contraindicated due to constipation     Current Outpatient Medications on File Prior to Visit  Medication Sig Dispense Refill   AMBULATORY NON FORMULARY MEDICATION Medication Name: Diltiazem 2% cream w/Lidocaine 5 % cream - Using your index finger, apply a small amount of medication inside the rectum up to your first knuckle/joint three times daily x 6-8 weeks. 30 g 1   estradiol (ESTRACE) 0.1 MG/GM vaginal cream Place 0.5 g vaginally 2 (two) times a week. Place 0.5g nightly for two weeks then twice a week after 30 g 11   hydroxychloroquine  (PLAQUENIL) 200 MG tablet Take 200 mg by mouth 2 (two) times daily. 1 tab by mouth in AM, 1 tab by mouth in PM     hydrOXYzine (ATARAX) 25 MG tablet Take 25 mg by mouth daily as needed.     inFLIXimab (REMICADE) 100 MG injection 7mg /kg     loratadine-pseudoephedrine (CLARITIN-D 24 HOUR) 10-240 MG 24 hr tablet 1 tablet as needed Orally Once a day for 30 day(s)     methenamine (HIPREX) 1 g tablet Take 1 tablet (1 g total) by mouth 2 (two) times daily with a meal. 180 tablet 3   Multiple Vitamins-Minerals (MULTI ADULT GUMMIES PO) Take by mouth.     naratriptan (AMERGE) 2.5 MG tablet TAKE 1 TABLET BY MOUTH AT ONSET OF HEADACHE. IF RETURNS OR DOES NOT RESOLVE, MAY REPEAT AFTER 4 HOURS. DO NOT EXCEED 2 TABS IN 24 HOURS 9 tablet 5   ondansetron (ZOFRAN) 4 MG tablet Take 1 tablet (4 mg total) by mouth as directed. Take 30 mins prior to drinking prep to prevent nausea 5 tablet 0   pantoprazole (PROTONIX) 40 MG tablet TAKE 1 TABLET BY MOUTH EVERY DAY 90 tablet 1   Probiotic Product (PROBIOTIC BLEND PO) Take by mouth. Darrold Junker Prebiotic     sucralfate (CARAFATE) 1 GM/10ML suspension TAKE 10 MLS (1 G TOTAL) BY MOUTH 2 (TWO) TIMES DAILY. 180 mL 1   traMADol (ULTRAM) 50 MG tablet Take  1 tablet (50 mg total) by mouth every 8 (eight) hours as needed. 90 tablet 0   valACYclovir (VALTREX) 1000 MG tablet Take by mouth.     No current facility-administered medications on file prior to visit.   Past Medical History:  Diagnosis Date   Acid reflux    Allergy    Anal fissure    Arthritis    Barrett's esophagus    Bipolar disorder (HCC)    Cervical herniated disc    Chronic headaches    Chronic pain    Depression    Dermatomyositis (HCC)    GERD (gastroesophageal reflux disease)    H/O vitamin D deficiency    Hypercholesterolemia    IBS (irritable bowel syndrome)    Ocular migraine    Osteoporosis    Past Surgical History:  Procedure Laterality Date   APPENDECTOMY     ESOPHAGOGASTRIC FUNDOPLICATION      GALLBLADDER SURGERY  2001   RETINAL DETACHMENT SURGERY Right 11/29/2021   TONSILLECTOMY  1981   toupetfundoplicatio  2000   TUBAL LIGATION        Physical Exam:   Vital Signs: BP 121/72 (BP Location: Right Arm, Patient Position: Sitting, Cuff Size: Small)   Pulse 83   Ht 5\' 8"  (1.727 m)   Wt 157 lb (71.2 kg)   BMI 23.87 kg/m  GENERAL:  well appearing, very pleasant middle-age Caucasian female, in no acute distress, alert  SKIN:  Color, texture, turgor normal. No rashes or lesions HEAD:  Normocephalic/atraumatic. RESP: normal respiratory effort MSK:  No gross joint deformities.   NEUROLOGICAL: Mental Status: Alert, oriented to person, place and time, Follows commands, and Speech fluent and appropriate. Cranial Nerves: PERRL, face symmetric, no dysarthria, hearing grossly intact Motor: moves all extremities equally Gait: normal-based.      IMPRESSION: 67 year old female with a history of dermatomyositis who presents for follow up of migraines.  Previously reported significant improvement of migraines and cervicalgia after working with PT and dry needling but now with worsening migraines over the past few months, now occurring almost daily .    PLAN: -Preventive: start process for botox approval -Rescue: Continue naratriptan PRN -next: CR[facility]    Follow up in 6 months or call earlier if needed    I spent 25 minutes of face-to-face and non-face-to-face time with patient.  This included previsit chart review, lab review, study review, order entry, electronic health record documentation, patient education and discussion regarding migraine headaches and treatment plan and answered all the questions to patient's satisfaction  Ihor Austin, [facility]  [facility] [address] [place], [place] [address]  Phone [phone] Fax [phone] Note: This document was prepared with digital dictation and possible smart phrase technology. Any  transcriptional errors that result from this process are unintentional.

## 2023-04-02 NOTE — Telephone Encounter (Signed)
Received Botox new start form from [place], NP. Pt's Medicare A+B/supplement does not require prior auth, will be buy/bill. Sent MyChart message to pt offering appt. Consent form given to medical records for filing.   G43.701

## 2023-04-05 ENCOUNTER — Ambulatory Visit: Payer: Medicare Other | Admitting: Physician Assistant

## 2023-04-12 ENCOUNTER — Ambulatory Visit: Payer: Medicare Other | Admitting: Internal Medicine

## 2023-04-12 ENCOUNTER — Encounter: Payer: Self-pay | Admitting: Internal Medicine

## 2023-04-12 VITALS — BP 122/61 | HR 75 | Temp 96.0°F | Resp 21 | Ht 67.0 in | Wt 159.0 lb

## 2023-04-12 DIAGNOSIS — D122 Benign neoplasm of ascending colon: Secondary | ICD-10-CM

## 2023-04-12 DIAGNOSIS — Z1211 Encounter for screening for malignant neoplasm of colon: Secondary | ICD-10-CM

## 2023-04-12 MED ORDER — SODIUM CHLORIDE 0.9 % IV SOLN: 500.0000 mL | Freq: Once | INTRAVENOUS | Status: DC

## 2023-04-12 NOTE — Progress Notes (Signed)
Pt reports having nerve surgery on Right Arm with [facility] on 03/07/2023.

## 2023-04-12 NOTE — Patient Instructions (Addendum)
Handouts provided on polyps, hemorrhoids and diverticulosis.  Resume previous diet.  Continue present medications.  Await pathology results.  Return to GI clinic in 3 months for follow up of constipation (see appointment info on AVS).   YOU HAD AN ENDOSCOPIC PROCEDURE TODAY AT THE [facility]:   Refer to the procedure report that was given to you for any specific questions about what was found during the examination.  If the procedure report does not answer your questions, please call your gastroenterologist to clarify.  If you requested that your care partner not be given the details of your procedure findings, then the procedure report has been included in a sealed envelope for you to review at your convenience later.  YOU SHOULD EXPECT: Some feelings of bloating in the abdomen. Passage of more gas than usual.  Walking can help get rid of the air that was put into your GI tract during the procedure and reduce the bloating. If you had a lower endoscopy (such as a colonoscopy or flexible sigmoidoscopy) you may notice spotting of blood in your stool or on the toilet paper. If you underwent a bowel prep for your procedure, you may not have a normal bowel movement for a few days.  Please Note:  You might notice some irritation and congestion in your nose or some drainage.  This is from the oxygen used during your procedure.  There is no need for concern and it should clear up in a day or so.  SYMPTOMS TO REPORT IMMEDIATELY:  Following lower endoscopy (colonoscopy or flexible sigmoidoscopy):  Excessive amounts of blood in the stool  Significant tenderness or worsening of abdominal pains  Swelling of the abdomen that is new, acute  Fever of 100F or higher  For urgent or emergent issues, a gastroenterologist can be reached at any hour by calling [phone]. Do not use MyChart messaging for urgent concerns.    DIET:  We do recommend a small meal at first, but then you may proceed  to your regular diet.  Drink plenty of fluids but you should avoid alcoholic beverages for 24 hours.  ACTIVITY:  You should plan to take it easy for the rest of today and you should NOT DRIVE or use heavy machinery until tomorrow (because of the sedation medicines used during the test).    FOLLOW UP: Our staff will call the number listed on your records the next business day following your procedure.  We will call around 7:15- 8:00 am to check on you and address any questions or concerns that you may have regarding the information given to you following your procedure. If we do not reach you, we will leave a message.     If any biopsies were taken you will be contacted by phone or by letter within the next 1-3 weeks.  Please call us at [phone] if you have not heard about the biopsies in 3 weeks.    SIGNATURES/CONFIDENTIALITY: You and/or your care partner have signed paperwork which will be entered into your electronic medical record.  These signatures attest to the fact that that the information above on your After Visit Summary has been reviewed and is understood.  Full responsibility of the confidentiality of this discharge information lies with you and/or your care-partner.

## 2023-04-12 NOTE — Op Note (Signed)
[facility] Patient Name: Tina Pena Procedure Date: 04/12/2023 9:23 AM MRN: [id number] Endoscopist: Madelyn Brunner [place] , , [id number] Age: 67 Referring MD:  Date of Birth: 01-09-1956 Gender: Female Account #: [id number] Procedure:                Colonoscopy Indications:              Screening for colorectal malignant neoplasm Medicines:                Monitored Anesthesia Care Procedure:                Pre-Anesthesia Assessment:                           - Prior to the procedure, a History and Physical                            was performed, and patient medications and                            allergies were reviewed. The patient's tolerance of                            previous anesthesia was also reviewed. The risks                            and benefits of the procedure and the sedation                            options and risks were discussed with the patient.                            All questions were answered, and informed consent                            was obtained. Prior Anticoagulants: The patient has                            taken no anticoagulant or antiplatelet agents. ASA                            Grade Assessment: II - A patient with mild systemic                            disease. After reviewing the risks and benefits,                            the patient was deemed in satisfactory condition to                            undergo the procedure.                           After obtaining informed consent, the colonoscope  was passed under direct vision. Throughout the                            procedure, the patient's blood pressure, pulse, and                            oxygen saturations were monitored continuously. The                            Olympus Scope SN: [id number] was introduced through                            the anus and advanced to the the terminal ileum.                            The  colonoscopy was performed without difficulty.                            The patient tolerated the procedure well. The                            quality of the bowel preparation was good. The                            terminal ileum, ileocecal valve, appendiceal                            orifice, and rectum were photographed. Scope In: 9:39:38 AM Scope Out: 10:13:42 AM Scope Withdrawal Time: 0 hours 15 minutes 44 seconds  Total Procedure Duration: 0 hours 34 minutes 4 seconds  Findings:                 The terminal ileum appeared normal.                           Multiple diverticula were found in the entire colon.                           A 12 mm polyp was found in the ascending colon. The                            polyp was sessile. The polyp was removed with a                            cold snare. Resection and retrieval were complete.                           Non-bleeding internal hemorrhoids were found during                            retroflexion. Complications:            No immediate complications. Estimated Blood Loss:     Estimated blood loss was minimal. Impression:               -  The examined portion of the ileum was normal.                           - Diverticulosis in the entire examined colon.                           - One 12 mm polyp in the ascending colon, removed                            with a cold snare. Resected and retrieved.                           - Non-bleeding internal hemorrhoids. Recommendation:           - Discharge patient to home (with escort).                           - Await pathology results.                           - Return to GI clinic in 3 months for follow up of                            constipation.                           - The findings and recommendations were discussed                            with the patient. Dr Particia Lather "Alan Ripper" Leonides Schanz,  04/12/2023 10:24:33 AM

## 2023-04-12 NOTE — Progress Notes (Signed)
Report to PACU, RN, vss, BBS= Clear.  

## 2023-04-12 NOTE — Progress Notes (Signed)
Called to room to assist during endoscopic procedure.  Patient ID and intended procedure confirmed with present staff. Received instructions for my participation in the procedure from the performing physician.  

## 2023-04-12 NOTE — Progress Notes (Signed)
GASTROENTEROLOGY PROCEDURE H&P NOTE   Primary Care Physician: Allwardt, Crist Infante, PA-C    Reason for Procedure:   Colon cancer screening  Plan:    Colonoscopy  Patient is appropriate for endoscopic procedure(s) in the ambulatory ([facility]) setting.  The nature of the procedure, as well as the risks, benefits, and alternatives were carefully and thoroughly reviewed with the patient. Ample time for discussion and questions allowed. The patient understood, was satisfied, and agreed to proceed.     HPI: Tina Pena is a 67 y.o. female who presents for colonoscopy for evaluation of colon cancer screening.  Patient was most recently seen in the Gastroenterology Clinic on 02/15/23.  No interval change in medical history since that appointment. Please refer to that note for full details regarding GI history and clinical presentation.   Past Medical History:  Diagnosis Date   Acid reflux    Allergy    Anal fissure    Anxiety    Arthritis    Barrett's esophagus    Bipolar disorder (HCC)    Cervical herniated disc    Chronic headaches    Chronic pain    Depression    Dermatomyositis (HCC)    GERD (gastroesophageal reflux disease)    H/O vitamin D deficiency    Hypercholesterolemia    IBS (irritable bowel syndrome)    Ocular migraine    Osteoporosis     Past Surgical History:  Procedure Laterality Date   APPENDECTOMY     ESOPHAGOGASTRIC FUNDOPLICATION     GALLBLADDER SURGERY  2001   NERVE SURGERY Right 03/07/2023   [facility]- Right ARM   RETINAL DETACHMENT SURGERY Right 11/29/2021   TONSILLECTOMY  1981   toupetfundoplicatio  2000   TUBAL LIGATION      Prior to Admission medications   Medication Sig Start Date End Date Taking? Authorizing Provider  CYMBALTA 60 MG capsule 2 capsules Orally Once a day 02/09/23  Yes [provider]  estradiol (ESTRACE) 0.1 MG/GM vaginal cream Place 0.5 g vaginally 2 (two) times a week. Place 0.5g nightly for two weeks then  twice a week after 08/24/22  Yes Marguerita Beards, MD  hydroxychloroquine (PLAQUENIL) 200 MG tablet Take 200 mg by mouth 2 (two) times daily. 1 tab by mouth in AM, 1 tab by mouth in PM   Yes [provider]  hydrOXYzine (ATARAX) 25 MG tablet Take 25 mg by mouth daily as needed.   Yes [provider]  loratadine-pseudoephedrine (CLARITIN-D 24 HOUR) 10-240 MG 24 hr tablet 1 tablet as needed Orally Once a day for 30 day(s)   Yes [provider]  methenamine (HIPREX) 1 g tablet Take 1 tablet (1 g total) by mouth 2 (two) times daily with a meal. 01/15/23  Yes Zuleta, Joan Mayans, NP  Multiple Vitamins-Minerals (MULTI ADULT GUMMIES PO) Take by mouth.   Yes [provider]  naratriptan (AMERGE) 2.5 MG tablet TAKE 1 TABLET BY MOUTH AT ONSET OF HEADACHE. IF RETURNS OR DOES NOT RESOLVE, MAY REPEAT AFTER 4 HOURS. DO NOT EXCEED 2 TABS IN 24 HOURS 11/06/22  Yes Ocie Doyne, MD  pantoprazole (PROTONIX) 40 MG tablet TAKE 1 TABLET BY MOUTH EVERY DAY 12/01/22  Yes Allwardt, Alyssa M, PA-C  Probiotic Product (PROBIOTIC BLEND PO) Take by mouth. Darrold Junker Prebiotic   Yes [provider]  traMADol (ULTRAM) 50 MG tablet Take 1 tablet (50 mg total) by mouth every 8 (eight) hours as needed. 03/26/23 04/25/23 Yes Allwardt, Crist Infante, PA-C  AMBULATORY NON FORMULARY MEDICATION Medication Name: Diltiazem 2% cream w/Lidocaine 5 % cream - Using your index finger, apply a small amount of medication inside the rectum up to your first knuckle/joint three times daily x 6-8 weeks. 02/15/23   Zehr, Princella Pellegrini, PA-C  clonazePAM (KLONOPIN) 1 MG tablet Take 1 mg by mouth at bedtime as needed.    [provider]  inFLIXimab (REMICADE) 100 MG injection 7mg /kg    [provider]  ondansetron (ZOFRAN) 4 MG tablet Take 1 tablet (4 mg total) by mouth as directed. Take 30 mins prior to drinking prep to prevent nausea 03/28/23   Imogene Burn, MD  sucralfate (CARAFATE) 1 GM/10ML suspension  TAKE 10 MLS (1 G TOTAL) BY MOUTH 2 (TWO) TIMES DAILY. 03/12/23   Zehr, Princella Pellegrini, PA-C  valACYclovir (VALTREX) 1000 MG tablet Take by mouth. 10/17/21   [provider]    Current Outpatient Medications  Medication Sig Dispense Refill   CYMBALTA 60 MG capsule 2 capsules Orally Once a day     estradiol (ESTRACE) 0.1 MG/GM vaginal cream Place 0.5 g vaginally 2 (two) times a week. Place 0.5g nightly for two weeks then twice a week after 30 g 11   hydroxychloroquine (PLAQUENIL) 200 MG tablet Take 200 mg by mouth 2 (two) times daily. 1 tab by mouth in AM, 1 tab by mouth in PM     hydrOXYzine (ATARAX) 25 MG tablet Take 25 mg by mouth daily as needed.     loratadine-pseudoephedrine (CLARITIN-D 24 HOUR) 10-240 MG 24 hr tablet 1 tablet as needed Orally Once a day for 30 day(s)     methenamine (HIPREX) 1 g tablet Take 1 tablet (1 g total) by mouth 2 (two) times daily with a meal. 180 tablet 3   Multiple Vitamins-Minerals (MULTI ADULT GUMMIES PO) Take by mouth.     naratriptan (AMERGE) 2.5 MG tablet TAKE 1 TABLET BY MOUTH AT ONSET OF HEADACHE. IF RETURNS OR DOES NOT RESOLVE, MAY REPEAT AFTER 4 HOURS. DO NOT EXCEED 2 TABS IN 24 HOURS 9 tablet 5   pantoprazole (PROTONIX) 40 MG tablet TAKE 1 TABLET BY MOUTH EVERY DAY 90 tablet 1   Probiotic Product (PROBIOTIC BLEND PO) Take by mouth. Darrold Junker Prebiotic     traMADol (ULTRAM) 50 MG tablet Take 1 tablet (50 mg total) by mouth every 8 (eight) hours as needed. 90 tablet 0   AMBULATORY NON FORMULARY MEDICATION Medication Name: Diltiazem 2% cream w/Lidocaine 5 % cream - Using your index finger, apply a small amount of medication inside the rectum up to your first knuckle/joint three times daily x 6-8 weeks. 30 g 1   clonazePAM (KLONOPIN) 1 MG tablet Take 1 mg by mouth at bedtime as needed.     inFLIXimab (REMICADE) 100 MG injection 7mg /kg     ondansetron (ZOFRAN) 4 MG tablet Take 1 tablet (4 mg total) by mouth as directed. Take 30 mins prior to drinking prep to  prevent nausea 5 tablet 0   sucralfate (CARAFATE) 1 GM/10ML suspension TAKE 10 MLS (1 G TOTAL) BY MOUTH 2 (TWO) TIMES DAILY. 180 mL 1   valACYclovir (VALTREX) 1000 MG tablet Take by mouth.     Current Facility-Administered Medications  Medication Dose Route Frequency Provider Last Rate Last Admin   0.9 %  sodium chloride infusion  500 mL Intravenous Once Imogene Burn, MD        Allergies as of 04/12/2023 - Review Complete 04/12/2023  Allergen Reaction Noted  Codeine Nausea And Vomiting 01/18/2018   Garlic Nausea And Vomiting 01/18/2018   Other Nausea And Vomiting 01/18/2018   Sertraline Rash 12/12/2016   Methotrexate  01/29/2022    Family History  Problem Relation Age of Onset   Cancer Mother    Lung cancer Mother    COPD Father    Cancer Father        breast   Bipolar disorder Father    Anxiety disorder Father    Depression Father    Paranoid behavior Father    Heart disease Father    Stroke Maternal Grandmother    Hypertension Maternal Grandmother    Bipolar disorder Maternal Grandmother    Dementia Maternal Grandmother    Hypertension Maternal Grandfather    Stroke Paternal Grandmother    Hypertension Paternal Grandmother    Bipolar disorder Paternal Grandmother    Dementia Paternal Grandmother    Hypertension Paternal Grandfather    Stomach cancer Neg Hx    Colon cancer Neg Hx    Esophageal cancer Neg Hx    Rectal cancer Neg Hx     Social History   Socioeconomic History   Marital status: Married    Spouse name: Not on file   Number of children: 2   Years of education: Not on file   Highest education level: Bachelor's degree (e.g., BA, AB, BS)  Occupational History   Occupation: retired  Tobacco Use   Smoking status: Never   Smokeless tobacco: Never  Vaping Use   Vaping status: Never Used  Substance and Sexual Activity   Alcohol use: Yes    Alcohol/week: 2.0 standard drinks of alcohol    Types: 2 Glasses of wine per week    Comment: occasional  glass of wine   Drug use: Never   Sexual activity: Yes    Partners: Male    Birth control/protection: None  Other Topics Concern   Not on file  Social History Narrative   Lives with husband   Social Determinants of Health   Financial Resource Strain: Low Risk  (02/22/2023)   Overall Financial Resource Strain (CARDIA)    Difficulty of Paying Living Expenses: Not hard at all  Food Insecurity: No Food Insecurity (02/22/2023)   Hunger Vital Sign    Worried About Running Out of Food in the Last Year: Never true    Ran Out of Food in the Last Year: Never true  Transportation Needs: No Transportation Needs (02/22/2023)   PRAPARE - Administrator, Civil Service (Medical): No    Lack of Transportation (Non-Medical): No  Physical Activity: Sufficiently Active (02/22/2023)   Exercise Vital Sign    Days of Exercise per Week: 5 days    Minutes of Exercise per Session: 90 min  Stress: No Stress Concern Present (02/22/2023)   [employer] of Occupational Health - Occupational Stress Questionnaire    Feeling of Stress : Only a little  Social Connections: Socially Integrated (02/22/2023)   Social Connection and Isolation Panel [NHANES]    Frequency of Communication with Friends and Family: Three times a week    Frequency of Social Gatherings with Friends and Family: Once a week    Attends Religious Services: 1 to 4 times per year    Active Member of [employer] or Organizations: Yes    Attends Banker Meetings: More than 4 times per year    Marital Status: Married  Catering manager Violence: Not At Risk (06/02/2022)   Humiliation, Afraid, Rape, and Kick  questionnaire    Fear of Current or Ex-Partner: No    Emotionally Abused: No    Physically Abused: No    Sexually Abused: No    Physical Exam: Vital signs in last 24 hours: BP (!) 157/86   Pulse 71   Temp (!) 96 F (35.6 C) (Temporal)   Ht 5\' 7"  (1.702 m)   Wt 159 lb (72.1 kg)   SpO2 98%   BMI 24.90 kg/m  GEN:  NAD EYE: Sclerae anicteric ENT: MMM CV: Non-tachycardic Pulm: No increased WOB GI: Soft NEURO:  Alert & Oriented   Eulah Pont, MD [facility]   04/12/2023 9:28 AM

## 2023-04-13 ENCOUNTER — Telehealth: Payer: Self-pay | Admitting: *Deleted

## 2023-04-13 NOTE — Telephone Encounter (Signed)
Left message on f/u call 

## 2023-04-16 ENCOUNTER — Encounter: Payer: Self-pay | Admitting: Internal Medicine

## 2023-04-16 ENCOUNTER — Ambulatory Visit (INDEPENDENT_AMBULATORY_CARE_PROVIDER_SITE_OTHER): Payer: Medicare Other | Admitting: Adult Health

## 2023-04-16 ENCOUNTER — Encounter: Payer: Self-pay | Admitting: Adult Health

## 2023-04-16 DIAGNOSIS — G43701 Chronic migraine without aura, not intractable, with status migrainosus: Secondary | ICD-10-CM

## 2023-04-16 LAB — SURGICAL PATHOLOGY

## 2023-04-16 MED ORDER — ONABOTULINUMTOXINA 200 UNITS IJ SOLR
155.0000 [IU] | Freq: Once | INTRAMUSCULAR | Status: AC
  Administered 2023-04-16: 155 [IU] via INTRAMUSCULAR

## 2023-04-16 MED ORDER — NARATRIPTAN HCL 2.5 MG PO TABS: 2.5000 mg | ORAL_TABLET | ORAL | 11 refills | Status: DC | PRN

## 2023-04-16 NOTE — Progress Notes (Signed)
Botox- 200 units x 1 vial Lot: [id number] Expiration: 06/24/2025 NDC: 4782-9562-13  Bacteriostatic 0.9% Sodium Chloride- * mL  Lot: [id number] Expiration: 09/25/2023 NDC: 4696-2952-84  Dx: G43.701 S/P OR B/B Witnessed by Cheron Every CMA

## 2023-04-16 NOTE — Progress Notes (Signed)
Patient is being seen for initial Botox injection. Reports continued daily headaches with a couple severe migraines per month. Use of naratriptan for rescue with benefit, requests refill. Tolerated procedure well today. Will return in 3 months for repeat injection.        Consent Form Botulism Toxin Injection For Chronic Migraine    Reviewed orally with patient, additionally signature is on file:  Botulism toxin has been approved by the Federal drug administration for treatment of chronic migraine. Botulism toxin does not cure chronic migraine and it may not be effective in some patients.  The administration of botulism toxin is accomplished by injecting a small amount of toxin into the muscles of the neck and head. Dosage must be titrated for each individual. Any benefits resulting from botulism toxin tend to wear off after 3 months with a repeat injection required if benefit is to be maintained. Injections are usually done every 3-4 months with maximum effect peak achieved by about 2 or 3 weeks. Botulism toxin is expensive and you should be sure of what costs you will incur resulting from the injection.  The side effects of botulism toxin use for chronic migraine may include:   -Transient, and usually mild, facial weakness with facial injections  -Transient, and usually mild, head or neck weakness with head/neck injections  -Reduction or loss of forehead facial animation due to forehead muscle weakness  -Eyelid drooping  -Dry eye  -Pain at the site of injection or bruising at the site of injection  -Double vision  -Potential unknown long term risks   Contraindications: You should not have Botox if you are pregnant, nursing, allergic to albumin, have an infection, skin condition, or muscle weakness at the site of the injection, or have myasthenia gravis, Lambert-Eaton syndrome, or ALS.  It is also possible that as with any injection, there may be an allergic reaction or no  effect from the medication. Reduced effectiveness after repeated injections is sometimes seen and rarely infection at the injection site may occur. All care will be taken to prevent these side effects. If therapy is given over a long time, atrophy and wasting in the muscle injected may occur. Occasionally the patient's become refractory to treatment because they develop antibodies to the toxin. In this event, therapy needs to be modified.  I have read the above information and consent to the administration of botulism toxin.    BOTOX PROCEDURE NOTE FOR MIGRAINE HEADACHE  Contraindications and precautions discussed with patient(above). Aseptic procedure was observed and patient tolerated procedure. Procedure performed by Ihor Austin, AGNP-BC.   The condition has existed for more than 6 months, and pt does not have a diagnosis of ALS, Myasthenia Gravis or Lambert-Eaton Syndrome.  Risks and benefits of injections discussed and pt agrees to proceed with the procedure.  Written consent obtained  These injections are medically necessary. Pt  receives good benefits from these injections. These injections do not cause sedations or hallucinations which the oral therapies may cause.   Description of procedure:  The patient was placed in a sitting position. The standard protocol was used for Botox as follows, with 5 units of Botox injected at each site:  -Procerus muscle, midline injection  -Corrugator muscle, bilateral injection  -Frontalis muscle, bilateral injection, with 2 sites each side, medial injection was performed in the upper one third of the frontalis muscle, in the region vertical from the medial inferior edge of the superior orbital rim. The lateral injection was again in  the upper one third of the forehead vertically above the lateral limbus of the cornea, 1.5 cm lateral to the medial injection site.  -Temporalis muscle injection, 4 sites, bilaterally. The first injection was 3 cm  above the tragus of the ear, second injection site was 1.5 cm to 3 cm up from the first injection site in line with the tragus of the ear. The third injection site was 1.5-3 cm forward between the first 2 injection sites. The fourth injection site was 1.5 cm posterior to the second injection site. 5th site laterally in the temporalis  muscleat the level of the outer canthus.  -Occipitalis muscle injection, 3 sites, bilaterally. The first injection was done one half way between the occipital protuberance and the tip of the mastoid process behind the ear. The second injection site was done lateral and superior to the first, 1 fingerbreadth from the first injection. The third injection site was 1 fingerbreadth superiorly and medially from the first injection site.  -Cervical paraspinal muscle injection, 2 sites, bilaterally. The first injection site was 1 cm from the midline of the cervical spine, 3 cm inferior to the lower border of the occipital protuberance. The second injection site was 1.5 cm superiorly and laterally to the first injection site.  -Trapezius muscle injection was performed at 3 sites, bilaterally. The first injection site was in the upper trapezius muscle halfway between the inflection point of the neck, and the acromion. The second injection site was one half way between the acromion and the first injection site. The third injection was done between the first injection site and the inflection point of the neck.    A total of 200 units of Botox was prepared, 155 units of Botox was injected as documented above, any Botox not injected was wasted. The patient tolerated the procedure well, there were no complications of the above procedure.   Ihor Austin, AGNP-BC  [facility] [address] [place], [place] [address]  Phone [phone] Fax [phone] Note: This document was prepared with digital dictation and possible smart phrase technology. Any  transcriptional errors that result from this process are unintentional.

## 2023-04-18 ENCOUNTER — Encounter: Payer: Self-pay | Admitting: Internal Medicine

## 2023-04-18 ENCOUNTER — Ambulatory Visit: Payer: Medicare Other | Admitting: Internal Medicine

## 2023-04-18 ENCOUNTER — Other Ambulatory Visit: Payer: Self-pay

## 2023-04-18 VITALS — BP 140/86 | HR 79 | Temp 98.0°F | Resp 16 | Ht 67.72 in | Wt 157.4 lb

## 2023-04-18 DIAGNOSIS — B369 Superficial mycosis, unspecified: Secondary | ICD-10-CM

## 2023-04-18 DIAGNOSIS — L299 Pruritus, unspecified: Secondary | ICD-10-CM

## 2023-04-18 DIAGNOSIS — M3313 Other dermatomyositis without myopathy: Secondary | ICD-10-CM

## 2023-04-18 MED ORDER — TRIAMCINOLONE ACETONIDE 0.1 % EX OINT: TOPICAL_OINTMENT | CUTANEOUS | 1 refills | Status: DC

## 2023-04-18 MED ORDER — FLUCONAZOLE 150 MG PO TABS: 150.0000 mg | ORAL_TABLET | ORAL | 0 refills | Status: DC

## 2023-04-18 NOTE — Patient Instructions (Addendum)
Pruritus Fungal Dermatitis - I am not convinced this is drug related (5-FU) since the rash has been diffuse and not just located under the eye and even with quick discontinuation, it has not improved  - Do a daily soaking tub bath in warm water for 10-15 minutes.  - Use a gentle, unscented cleanser at the end of the bath (such as Dove unscented bar or baby wash, or Aveeno sensitive body wash). Then rinse, pat half-way dry, and apply a gentle, unscented moisturizer cream or ointment (Cerave, Cetaphil, Eucerin, Aveeno)  all over while still damp. Dry skin makes the itching worse. The skin should be moisturized with a gentle, unscented moisturizer at least twice daily.  - Use only unscented liquid laundry detergent. - Will skin test at next visit.  Hold all anti histamines (including Claritin) for 3 days prior to next visit.  - Take Fluconazole 150mg  weekly for 2 weeks.  - Use triamcinolone 0.1% twice daily as needed.  Maximum use 10 days at a time.   - If no improvement, will refer to [facility].  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   Follow up: 10/30 at 8:30 AM for skin testing 1-55.

## 2023-04-18 NOTE — Progress Notes (Signed)
NEW PATIENT  Date of Service/Encounter:  04/18/23  Consult requested by: Allwardt, Crist Infante, PA-C   Subjective:   Tina Pena (DOB: 09-Mar-1956) is a 67 y.o. female who presents to the clinic on 04/18/2023 with a chief complaint of Establish Care, Allergic Reaction (To lotion that dermatologist gave her with rash and swelling wide spread over body./), Pruritus, Rash, and Angioedema .    History obtained from: chart review and patient.   Onset of rash in early August.  Initially used a cream 5-FU under the eye for a precancerous lesion on R side and then started breaking out under the eye bilaterally followed by arms.  The rashes are red and very itchy.  Also just feels itchy in general.  Has been on multiple oral prednisone courses with minimal relief.  Rash is no longer on the face but on arms.  Saw Dermatology and started on cream for tinea; has been on this for over a week without improvement.  Does note the rash is worse with exposure to sun.  Has dermatomyositis, on Remicade since 2019 with [facility].  Reports doing very well with this.  Denies any muscle weakness, joint pain.   Denies use of any new products. On Claritin D for migraines but denies chronic rhinitis, congestion, sneezing, itchy watery eyes.  Never had allergy testing in the past.    Past Medical History: Past Medical History:  Diagnosis Date   Acid reflux    Allergy    Anal fissure    Anxiety    Arthritis    Barrett's esophagus    Bipolar disorder (HCC)    Cervical herniated disc    Chronic headaches    Chronic pain    Depression    Dermatomyositis (HCC)    GERD (gastroesophageal reflux disease)    H/O vitamin D deficiency    Hypercholesterolemia    IBS (irritable bowel syndrome)    Ocular migraine    Osteoporosis    Past Surgical History: Past Surgical History:  Procedure Laterality Date   APPENDECTOMY     ESOPHAGOGASTRIC FUNDOPLICATION     GALLBLADDER SURGERY  2001   NERVE SURGERY  Right 03/07/2023   [facility]- Right ARM   RETINAL DETACHMENT SURGERY Right 11/29/2021   TONSILLECTOMY  1981   toupetfundoplicatio  2000   TUBAL LIGATION      Family History: Family History  Problem Relation Age of Onset   Cancer Mother    Lung cancer Mother    COPD Father    Cancer Father        breast   Bipolar disorder Father    Anxiety disorder Father    Depression Father    Paranoid behavior Father    Heart disease Father    Stroke Maternal Grandmother    Hypertension Maternal Grandmother    Bipolar disorder Maternal Grandmother    Dementia Maternal Grandmother    Hypertension Maternal Grandfather    Stroke Paternal Grandmother    Hypertension Paternal Grandmother    Bipolar disorder Paternal Grandmother    Dementia Paternal Grandmother    Hypertension Paternal Grandfather    Stomach cancer Neg Hx    Colon cancer Neg Hx    Esophageal cancer Neg Hx    Rectal cancer Neg Hx     Social History:  Flooring in bedroom: Engineer, civil (consulting) Pets: none Tobacco use/exposure: none  Job: retired  Medication List:  Allergies as of 04/18/2023       Reactions   Codeine Nausea And  Vomiting   Other reaction(s): extreme stomach upset, GI Intolerance   Garlic Nausea And Vomiting   Other Nausea And Vomiting   Onions    Sertraline Rash   Other reaction(s): Confusion Other reaction(s): confusion, Other (See Comments) Other reaction(s): Confusion   Methotrexate    Other reaction(s): elevated LFT's Other reaction(s): elevated LFT's        Medication List        Accurate as of April 18, 2023 11:08 AM. If you have any questions, ask your nurse or doctor.          AMBULATORY NON FORMULARY MEDICATION Medication Name: Diltiazem 2% cream w/Lidocaine 5 % cream - Using your index finger, apply a small amount of medication inside the rectum up to your first knuckle/joint three times daily x 6-8 weeks.   Claritin-D 24 Hour 10-240 MG 24 hr tablet Generic drug:  loratadine-pseudoephedrine 1 tablet as needed Orally Once a day for 30 day(s)   clonazePAM 1 MG tablet Commonly known as: KLONOPIN Take 1 mg by mouth at bedtime as needed.   Cymbalta 60 MG capsule Generic drug: DULoxetine 2 capsules Orally Once a day   estradiol 0.1 MG/GM vaginal cream Commonly known as: ESTRACE Place 0.5 g vaginally 2 (two) times a week. Place 0.5g nightly for two weeks then twice a week after   fluconazole 150 MG tablet Commonly known as: DIFLUCAN Take 1 tablet (150 mg total) by mouth once a week. Started by: Birder Robson   hydrOXYzine 25 MG tablet Commonly known as: ATARAX Take 25 mg by mouth daily as needed.   inFLIXimab 100 MG injection Commonly known as: REMICADE 7mg /kg   methenamine 1 g tablet Commonly known as: HIPREX Take 1 tablet (1 g total) by mouth 2 (two) times daily with a meal.   MULTI ADULT GUMMIES PO Take by mouth.   naratriptan 2.5 MG tablet Commonly known as: AMERGE Take 1 tablet (2.5 mg total) by mouth as needed for migraine. Take one (1) tablet at onset of headache; if returns or does not resolve, may repeat after 4 hours; do not exceed five (5) mg in 24 hours.   ondansetron 4 MG tablet Commonly known as: ZOFRAN Take 1 tablet (4 mg total) by mouth as directed. Take 30 mins prior to drinking prep to prevent nausea   pantoprazole 40 MG tablet Commonly known as: PROTONIX TAKE 1 TABLET BY MOUTH EVERY DAY   PROBIOTIC BLEND PO Take by mouth. Darrold Junker Prebiotic   sucralfate 1 GM/10ML suspension Commonly known as: CARAFATE TAKE 10 MLS (1 G TOTAL) BY MOUTH 2 (TWO) TIMES DAILY.   traMADol 50 MG tablet Commonly known as: ULTRAM Take 1 tablet (50 mg total) by mouth every 8 (eight) hours as needed.   triamcinolone ointment 0.1 % Commonly known as: KENALOG Apply twice daily for flare ups below neck, maximum 10 days. Started by: Birder Robson   valACYclovir 1000 MG tablet Commonly known as: VALTREX Take by mouth.          REVIEW OF SYSTEMS: Pertinent positives and negatives discussed in HPI.   Objective:   Physical Exam: BP (!) 140/86   Pulse 79   Temp 98 F (36.7 C)   Resp 16   Ht 5' 7.72" (1.72 m)   Wt 157 lb 6.4 oz (71.4 kg)   SpO2 99%   BMI 24.13 kg/m  Body mass index is 24.13 kg/m. GEN: alert, well developed HEENT: clear conjunctiva, nose without inferior turbinate hypertrophy, pink nasal  mucosa, no rhinorrhea HEART: regular rate and rhythm, no murmur LUNGS: clear to auscultation bilaterally, no coughing, unlabored respiration ABDOMEN: soft, non distended  SKIN: pink patches with some scaling on bilateral arms, few papular red lesions on abdomen   Reviewed:  04/01/2023: seen by Dr Girtha [facility] for actinic keratosis, possible drug reaction to 5 FU, tinea corporis.  Started on ketoconazole topical.   04/02/2023: seen by Neurology McCue NP for chronic migraines. Discussed starting Botox, Triptan PRN.   03/27/2023: normal AEC, normal WBC/Hgb/Plt  02/2023: normal TSH, normal rena/liver function Assessment:   1. Pruritus   2. Fungal dermatitis     Plan/Recommendations:  Pruritus Fungal Dermatitis Dermatomyositis  - I am not convinced this is drug related (5-FU) since the rash has been diffuse and not just located under the eye and even with quick discontinuation, it has not improved  - Due to persistent pruritus without clear reason, will perform skin testing at next visit. - Do a daily soaking tub bath in warm water for 10-15 minutes.  - Use a gentle, unscented cleanser at the end of the bath (such as Dove unscented bar or baby wash, or Aveeno sensitive body wash). Then rinse, pat half-way dry, and apply a gentle, unscented moisturizer cream or ointment (Cerave, Cetaphil, Eucerin, Aveeno)  all over while still damp. Dry skin makes the itching worse. The skin should be moisturized with a gentle, unscented moisturizer at least twice daily.  - Use only unscented liquid laundry  detergent. - Will skin test at next visit.  Hold all anti histamines (including Claritin) for 3 days prior to next visit.  - Take Fluconazole 150mg  weekly for 2 weeks.  No improvement with topical creams.  - Use triamcinolone 0.1% twice daily as needed.  Maximum use 10 days at a time.   - If no improvement, will refer to [facility] or recommend seeing her dermatologist back for further workup, possible biopsy.  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   Follow up: 10/30 at 8:30 AM for skin testing 1-55.    No follow-ups on file.  Alesia Morin, MD [facility]

## 2023-04-24 ENCOUNTER — Other Ambulatory Visit: Payer: Self-pay | Admitting: Physician Assistant

## 2023-04-24 DIAGNOSIS — G8929 Other chronic pain: Secondary | ICD-10-CM

## 2023-04-25 ENCOUNTER — Ambulatory Visit (INDEPENDENT_AMBULATORY_CARE_PROVIDER_SITE_OTHER): Payer: Medicare Other | Admitting: Internal Medicine

## 2023-04-25 ENCOUNTER — Encounter: Payer: Self-pay | Admitting: Internal Medicine

## 2023-04-25 DIAGNOSIS — L299 Pruritus, unspecified: Secondary | ICD-10-CM | POA: Diagnosis not present

## 2023-04-25 NOTE — Patient Instructions (Addendum)
Pruritus Rash - SPT 03/2023: negative to environmental allergens (pollens, molds, cats, dogs, dust mites, cockroach)  - I am not convinced this is drug related (5-FU) since the rash has been diffuse and not just located under the eye and even with quick discontinuation, it has not improved. The localized irritation under the eye may have been related.   - Tried oral anti fungal Fluconazole x2 weeks without response.   - Do a daily soaking tub bath in warm water for 10-15 minutes.  - Use a gentle, unscented cleanser at the end of the bath (such as Dove unscented bar or baby wash, or Aveeno sensitive body wash). Then rinse, pat half-way dry, and apply a gentle, unscented moisturizer cream or ointment (Cerave, Cetaphil, Eucerin, Aveeno)  all over while still damp. Dry skin makes the itching worse. The skin should be moisturized with a gentle, unscented moisturizer at least twice daily.  - Use only unscented liquid laundry detergent. - Follow back up with Dermatology.  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 - Okay to restart Claritin 10mg  daily for migraine prevention.

## 2023-04-25 NOTE — Progress Notes (Signed)
FOLLOW UP Date of Service/Encounter:  04/25/23   Subjective:  Tina Pena (DOB: 10-Jan-1956) is a 67 y.o. female who [facility] for follow up for skin testing.   History obtained from: chart review and patient.  Anti histamines held.   Past Medical History: Past Medical History:  Diagnosis Date   Acid reflux    Allergy    Anal fissure    Anxiety    Arthritis    Barrett's esophagus    Bipolar disorder (HCC)    Cervical herniated disc    Chronic headaches    Chronic pain    Depression    Dermatomyositis (HCC)    GERD (gastroesophageal reflux disease)    H/O vitamin D deficiency    Hypercholesterolemia    IBS (irritable bowel syndrome)    Ocular migraine    Osteoporosis     Objective:  There were no vitals taken for this visit. There is no height or weight on file to calculate BMI. Physical Exam: GEN: alert, well developed HEENT: clear conjunctiva, MMM LUNGS: no coughing, unlabored respiration  Skin Testing:  Skin prick testing was placed, which includes aeroallergens/foods, histamine control, and saline control.  Verbal consent was obtained prior to placing test.  Patient tolerated procedure well.  Allergy testing results were read and interpreted by myself, documented by clinical staff. Adequate positive and negative control.  Positive results to:  Results discussed with patient/family.  Airborne Adult Perc - 04/25/23 0845     Time Antigen Placed 0845    Allergen Manufacturer Waynette Buttery    Location Back    Number of Test 55    1. Control-Buffer 50% Glycerol Negative    2. Control-Histamine 3+    3. Bahia Negative    4. French Southern Territories Negative    5. Johnson Negative    6. [place] Blue Negative    7. Meadow Fescue Negative    8. Perennial Rye Negative    9. Timothy Negative    10. Ragweed Mix Negative    11. Cocklebur Negative    12. Plantain,  English Negative    13. Baccharis Negative    14. Dog Fennel Negative     15. Russian Thistle Negative    16. Lamb's Quarters Negative    17. Sheep Sorrell Negative    18. Rough Pigweed Negative    19. Marsh Elder, Rough Negative    20. Mugwort, Common Negative    21. Box, Elder Negative    22. Cedar, red Negative    23. Sweet Gum Negative    24. Pecan Pollen Negative    25. Pine Mix Negative    26. Walnut, Black Pollen Negative    27. Red Mulberry Negative    28. Ash Mix Negative    29. Birch Mix Negative    30. Beech American Negative    31. Cottonwood, Guinea-Bissau Negative    32. Hickory, White Negative    33. Maple Mix Negative    34. Oak, Guinea[place] Mix Negative    35. Sycamore Eastern Negative    36. Alternaria Alternata Negative    37. Cladosporium Herbarum Negative    38. Aspergillus Mix Negative    39. Penicillium Mix Negative    40. Bipolaris Sorokiniana (Helminthosporium) Negative    41. Drechslera Spicifera (Curvularia) Negative    42. Mucor Plumbeus Negative    43. Fusarium Moniliforme Negative    44. Aureobasidium Pullulans (pullulara) Negative    45. Rhizopus Oryzae  Negative    46. Botrytis Cinera Negative    47. Epicoccum Nigrum Negative    48. Phoma Betae Negative    49. Dust Mite Mix Negative    50. Cat Hair 10,000 BAU/ml Negative    51.  Dog Epithelia Negative    52. Mixed Feathers Negative    53. Horse Epithelia Negative    54. Cockroach, German Negative    55. Tobacco Leaf Negative              Assessment:   1. Pruritus     Plan/Recommendations:  Pruritus Rash - SPT 03/2023: negative to environmental allergens (pollens, molds, cats, dogs, dust mites, cockroach).  - I am not convinced this is drug related (5-FU) since the rash has been diffuse and not just located under the eye and even with quick discontinuation, it has not improved. The localized irritation under the eye may have been related.   - Tried oral anti fungal Fluconazole x2 weeks without response.   - Do a daily soaking tub bath in warm water for  10-15 minutes.  - Use a gentle, unscented cleanser at the end of the bath (such as Dove unscented bar or baby wash, or Aveeno sensitive body wash). Then rinse, pat half-way dry, and apply a gentle, unscented moisturizer cream or ointment (Cerave, Cetaphil, Eucerin, Aveeno)  all over while still damp. Dry skin makes the itching worse. The skin should be moisturized with a gentle, unscented moisturizer at least twice daily.  - Use only unscented liquid laundry detergent. - Follow back up with Dermatology.  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 - Okay to restart Claritin 10mg  daily for migraine prevention.     Return if symptoms worsen or fail to improve.  Alesia Morin, MD [facility]

## 2023-04-30 ENCOUNTER — Ambulatory Visit: Payer: Medicare Other

## 2023-05-01 ENCOUNTER — Ambulatory Visit (INDEPENDENT_AMBULATORY_CARE_PROVIDER_SITE_OTHER): Payer: Medicare Other

## 2023-05-01 ENCOUNTER — Other Ambulatory Visit (HOSPITAL_COMMUNITY)
Admission: RE | Admit: 2023-05-01 | Discharge: 2023-05-01 | Disposition: A | Discharge status: Home / Self Care | Payer: Medicare Other | Source: Other Acute Inpatient Hospital | Attending: Obstetrics and Gynecology | Admitting: Obstetrics and Gynecology

## 2023-05-01 VITALS — BP 117/69 | HR 81

## 2023-05-01 DIAGNOSIS — R350 Frequency of micturition: Secondary | ICD-10-CM

## 2023-05-01 DIAGNOSIS — R82998 Other abnormal findings in urine: Secondary | ICD-10-CM | POA: Insufficient documentation

## 2023-05-01 LAB — POCT URINALYSIS DIPSTICK
Bilirubin, UA: NEGATIVE
Blood, UA: NEGATIVE
Glucose, UA: NEGATIVE
Ketones, UA: NEGATIVE
Nitrite, UA: POSITIVE
Protein, UA: NEGATIVE
Spec Grav, UA: 1.02 (ref 1.010–1.025)
Urobilinogen, UA: 0.2 U/dL
pH, UA: 7.5 (ref 5.0–8.0)

## 2023-05-01 MED ORDER — SULFAMETHOXAZOLE-TRIMETHOPRIM 800-160 MG PO TABS: 1.0000 | ORAL_TABLET | Freq: Two times a day (BID) | ORAL | 0 refills | Status: DC

## 2023-05-01 NOTE — Patient Instructions (Signed)
Your Urine dip that was done in office was positive. I am sending the urine off for culture and you can take AZO over the counter for your discomfort.  We have also ordered a Bactrim for you to take while we wait for your culture results, hopefully this gives you some relief. We will contact you when the results are back between 3-5 days. If a different antibiotic is needed we will sent the order to the pharmacy and you will be notified. If you have any questions or concerns please feel free to call us at [phone]

## 2023-05-01 NOTE — Progress Notes (Signed)
Tina Pena is a 67 y.o. female  arrived today with UTI sx.  Per Dr. Jari Favre protocol: A urine specimen was collected and POCT Urine was done and urine culture sent to the lab. POCT Urine was positive  Pt was notified and prescription sent to the preferred pharmacy.

## 2023-05-03 LAB — URINE CULTURE: Culture: >=100000 — AB

## 2023-05-03 MED ORDER — NITROFURANTOIN MONOHYD MACRO 100 MG PO CAPS: 100.0000 mg | ORAL_CAPSULE | Freq: Two times a day (BID) | ORAL | 0 refills | Status: AC

## 2023-05-03 NOTE — Addendum Note (Signed)
Addended by: Marguerita Beards on: 05/03/2023 10:20 AM   Modules accepted: Orders

## 2023-05-03 NOTE — Progress Notes (Signed)
Called and LVM for patient.

## 2023-05-08 NOTE — Progress Notes (Signed)
Patient has been notified

## 2023-05-17 ENCOUNTER — Ambulatory Visit: Payer: Medicare Other | Admitting: Family

## 2023-05-17 ENCOUNTER — Encounter: Payer: Self-pay | Admitting: Family

## 2023-05-17 VITALS — BP 138/82 | HR 74 | Temp 98.0°F | Ht 67.0 in | Wt 160.0 lb

## 2023-05-17 DIAGNOSIS — R21 Rash and other nonspecific skin eruption: Secondary | ICD-10-CM

## 2023-05-17 MED ORDER — METHYLPREDNISOLONE ACETATE 80 MG/ML IJ SUSP: 80.0000 mg | Freq: Once | INTRAMUSCULAR | Status: AC

## 2023-05-17 MED ORDER — PREDNISONE 10 MG PO TABS: 10.0000 mg | ORAL_TABLET | ORAL | 0 refills | Status: DC

## 2023-05-17 NOTE — Progress Notes (Signed)
Patient ID: Tina Pena, female    DOB: 15-Apr-1956, 67 y.o.   MRN: [id number]  Chief Complaint  Patient presents with   Rash    Pt c/o rash all over her body, Has been to derm and allergist. Has tried tacromilus and Triamolone which did not help sx.    Discussed the use of AI scribe software for clinical note transcription with the patient, who gave verbal consent to proceed.  History of Present Illness   The patient, with a history of recurrent rashes, presents with swelling and redness starting under her right eye and ongoing rashes on various parts of her body, including trunk, bilateral arms. The rashes are itchy and have been unresponsive to previous treatments including topical and oral steroids, and eczema medication. The patient has seen an allergist and a dermatologist previously, and has been diagnosed with eczema, but the treatments have not been effective. The patient also mentions having had a bladder infection and a recent surgery which required a cast. The patient is scheduled to see a dermatologist again in December and is hoping to get biopsies done. Pt states she is going out of town this weekend and worried rash will worsen again.    Assessment & Plan:     Chronic Rash - Persistent despite multiple treatments including topical steroids, oral steroids, and Protopic. Possible diagnoses include eczema, allergic reaction, or skin infection. Itching is severe and affecting sleep. -Continue Protopic and Triamcinolone as prescribed. -Continue Hydroxyzine or Benadryl up to 50mg  at night for itching. -Continue using Hypoallergenic detergents, lotions and washes at home.  -Try Aveeno or generic oatmeal baths for sx relief. -Recommend biopsy by dermatologist to confirm diagnosis. -Plan to administer Depo Medrol 80mg  shot today for immediate relief. -Start Prednisone low dose taper pack tomorrow. -RTO precautions provided.     Subjective:    Outpatient Medications Prior to  Visit  Medication Sig Dispense Refill   AMBULATORY NON FORMULARY MEDICATION Medication Name: Diltiazem 2% cream w/Lidocaine 5 % cream - Using your index finger, apply a small amount of medication inside the rectum up to your first knuckle/joint three times daily x 6-8 weeks. 30 g 1   clonazePAM (KLONOPIN) 1 MG tablet Take 1 mg by mouth at bedtime as needed.     CYMBALTA 60 MG capsule 2 capsules Orally Once a day     estradiol (ESTRACE) 0.1 MG/GM vaginal cream Place 0.5 g vaginally 2 (two) times a week. Place 0.5g nightly for two weeks then twice a week after 30 g 11   fluconazole (DIFLUCAN) 150 MG tablet Take 1 tablet (150 mg total) by mouth once a week. 2 tablet 0   hydrOXYzine (ATARAX) 25 MG tablet Take 25 mg by mouth daily as needed.     inFLIXimab (REMICADE) 100 MG injection 7mg /kg     loratadine-pseudoephedrine (CLARITIN-D 24 HOUR) 10-240 MG 24 hr tablet 1 tablet as needed Orally Once a day for 30 day(s)     methenamine (HIPREX) 1 g tablet Take 1 tablet (1 g total) by mouth 2 (two) times daily with a meal. 180 tablet 3   Multiple Vitamins-Minerals (MULTI ADULT GUMMIES PO) Take by mouth.     naratriptan (AMERGE) 2.5 MG tablet Take 1 tablet (2.5 mg total) by mouth as needed for migraine. Take one (1) tablet at onset of headache; if returns or does not resolve, may repeat after 4 hours; do not exceed five (5) mg in 24 hours. 9 tablet 11   pantoprazole (PROTONIX) 40  MG tablet TAKE 1 TABLET BY MOUTH EVERY DAY 90 tablet 1   Probiotic Product (PROBIOTIC BLEND PO) Take by mouth. Darrold Junker Prebiotic     sucralfate (CARAFATE) 1 GM/10ML suspension TAKE 10 MLS (1 G TOTAL) BY MOUTH 2 (TWO) TIMES DAILY. 180 mL 1   sulfamethoxazole-trimethoprim (BACTRIM DS) 800-160 MG tablet Take 1 tablet by mouth 2 (two) times daily. 6 tablet 0   tacrolimus (PROTOPIC) 0.1 % ointment Apply to affected ares Externally Once a day for 30 days     traMADol (ULTRAM) 50 MG tablet TAKE 1 TABLET BY MOUTH EVERY 8 HOURS AS NEEDED. 90  tablet 0   triamcinolone ointment (KENALOG) 0.1 % Apply twice daily for flare ups below neck, maximum 10 days. 80 g 1   valACYclovir (VALTREX) 1000 MG tablet Take by mouth.     ondansetron (ZOFRAN) 4 MG tablet Take 1 tablet (4 mg total) by mouth as directed. Take 30 mins prior to drinking prep to prevent nausea 5 tablet 0   No facility-administered medications prior to visit.   Past Medical History:  Diagnosis Date   Acid reflux    Allergy    Anal fissure    Anxiety    Arthritis    Barrett's esophagus    Bipolar disorder (HCC)    Cervical herniated disc    Chronic headaches    Chronic pain    Depression    Dermatomyositis (HCC)    GERD (gastroesophageal reflux disease)    H/O vitamin D deficiency    Hypercholesterolemia    IBS (irritable bowel syndrome)    Ocular migraine    Osteoporosis    Past Surgical History:  Procedure Laterality Date   APPENDECTOMY     ESOPHAGOGASTRIC FUNDOPLICATION     GALLBLADDER SURGERY  2001   NERVE SURGERY Right 03/07/2023   [facility]- Right ARM   RETINAL DETACHMENT SURGERY Right 11/29/2021   TONSILLECTOMY  1981   toupetfundoplicatio  2000   TUBAL LIGATION     Allergies  Allergen Reactions   Codeine Nausea And Vomiting    Other reaction(s): extreme stomach upset, GI Intolerance   Garlic Nausea And Vomiting   Other Nausea And Vomiting    Onions    Sertraline Rash    Other reaction(s): Confusion Other reaction(s): confusion, Other (See Comments) Other reaction(s): Confusion    Methotrexate     Other reaction(s): elevated LFT's Other reaction(s): elevated LFT's       Objective:    Physical Exam Vitals and nursing note reviewed.  Constitutional:      Appearance: Normal appearance.  Cardiovascular:     Rate and Rhythm: Normal rate and regular rhythm.  Pulmonary:     Effort: Pulmonary effort is normal.     Breath sounds: Normal breath sounds.  Musculoskeletal:        General: Normal range of motion.  Skin:    General:  Skin is warm and dry.     Findings: Lesion (red bumps, on bilateral lower legs and arms, trunk) and rash (small red bumps on bilateral arms) present.  Neurological:     Mental Status: She is alert.  Psychiatric:        Mood and Affect: Mood normal.        Behavior: Behavior normal.    BP 138/82 (BP Location: Left Arm, Patient Position: Sitting, Cuff Size: Normal)   Pulse 74   Temp 98 F (36.7 C) (Temporal)   Ht 5\' 7"  (1.702 m)   Wt 160 lb (  72.6 kg)   SpO2 98%   BMI 25.06 kg/m  Wt Readings from Last 3 Encounters:  05/17/23 160 lb (72.6 kg)  04/18/23 157 lb 6.4 oz (71.4 kg)  04/12/23 159 lb (72.1 kg)      Dulce Sellar, NP

## 2023-05-25 ENCOUNTER — Other Ambulatory Visit: Payer: Self-pay | Admitting: Physician Assistant

## 2023-05-25 DIAGNOSIS — G8929 Other chronic pain: Secondary | ICD-10-CM

## 2023-05-29 ENCOUNTER — Ambulatory Visit (INDEPENDENT_AMBULATORY_CARE_PROVIDER_SITE_OTHER): Payer: Medicare Other | Admitting: Family Medicine

## 2023-05-29 VITALS — BP 164/102 | Temp 98.3°F | Resp 16 | Ht 67.5 in | Wt 159.0 lb

## 2023-05-29 DIAGNOSIS — R21 Rash and other nonspecific skin eruption: Secondary | ICD-10-CM

## 2023-05-29 MED ORDER — DOXYCYCLINE HYCLATE 100 MG PO TABS: 100.0000 mg | ORAL_TABLET | Freq: Two times a day (BID) | ORAL | 0 refills | Status: DC

## 2023-05-29 MED ORDER — HYDROXYZINE HCL 25 MG PO TABS: 25.0000 mg | ORAL_TABLET | Freq: Every day | ORAL | 1 refills | Status: DC | PRN

## 2023-05-29 NOTE — Progress Notes (Signed)
Subjective:     Patient ID: Tina Pena, female    DOB: Jul 30, 1955, 67 y.o.   MRN: [id number]  Chief Complaint  Patient presents with   Red Spots    Red pimple with pus on left calf/leg, has spread to chest, hands and arms, really painful and itching last week She believe it started as bug bites in June, has a new rash on hands Has tried ketoconazole cream, triamcinolone cream and tacrolimus cream with no relief    HPI rash Bug bites in May/June on legs-had been gardening.  Never resolved.  Getting new spots.past 1 wk, the LLE spots burst and clear fluid.  Getting spots on torso.  Very itchy past 1 wk.  No f/c.  No abx. No new meds/soaps, etc.   Has had steroid shots and pred a couple of times for other things, no help for this.  Concerned staph infection Skin issues on hands  Has seen derm-told fungal on hands- resolved but back this am.   In August-saw derm-effudex for skin ca and reaction same day. Some dots and swelling.  Seen at primary care and shot steroids.   Has seen allergist as well  Has RA-on remicaid.  Stopped plaquenil 2 mo ago d/t detached retina  Health Maintenance Due  Topic Date Due   MAMMOGRAM  Never done   Medicare Annual Wellness (AWV)  06/03/2023    Past Medical History:  Diagnosis Date   Acid reflux    Allergy    Anal fissure    Anxiety    Arthritis    Barrett's esophagus    Bipolar disorder (HCC)    Cervical herniated disc    Chronic headaches    Chronic pain    Depression    Dermatomyositis (HCC)    GERD (gastroesophageal reflux disease)    H/O vitamin D deficiency    Hypercholesterolemia    IBS (irritable bowel syndrome)    Ocular migraine    Osteoporosis     Past Surgical History:  Procedure Laterality Date   APPENDECTOMY     ESOPHAGOGASTRIC FUNDOPLICATION     GALLBLADDER SURGERY  2001   NERVE SURGERY Right 03/07/2023   [facility]- Right ARM   RETINAL DETACHMENT SURGERY Right 11/29/2021   TONSILLECTOMY  1981    toupetfundoplicatio  2000   TUBAL LIGATION       Current Outpatient Medications:    AMBULATORY NON FORMULARY MEDICATION, Medication Name: Diltiazem 2% cream w/Lidocaine 5 % cream - Using your index finger, apply a small amount of medication inside the rectum up to your first knuckle/joint three times daily x 6-8 weeks., Disp: 30 g, Rfl: 1   clonazePAM (KLONOPIN) 1 MG tablet, Take 1 mg by mouth at bedtime as needed., Disp: , Rfl:    CYMBALTA 60 MG capsule, 2 capsules Orally Once a day, Disp: , Rfl:    doxycycline (VIBRA-TABS) 100 MG tablet, Take 1 tablet (100 mg total) by mouth 2 (two) times daily., Disp: 20 tablet, Rfl: 0   estradiol (ESTRACE) 0.1 MG/GM vaginal cream, Place 0.5 g vaginally 2 (two) times a week. Place 0.5g nightly for two weeks then twice a week after, Disp: 30 g, Rfl: 11   inFLIXimab (REMICADE) 100 MG injection, 7mg /kg, Disp: , Rfl:    loratadine-pseudoephedrine (CLARITIN-D 24 HOUR) 10-240 MG 24 hr tablet, 1 tablet as needed Orally Once a day for 30 day(s), Disp: , Rfl:    Multiple Vitamins-Minerals (MULTI ADULT GUMMIES PO), Take by mouth., Disp: , Rfl:  naratriptan (AMERGE) 2.5 MG tablet, Take 1 tablet (2.5 mg total) by mouth as needed for migraine. Take one (1) tablet at onset of headache; if returns or does not resolve, may repeat after 4 hours; do not exceed five (5) mg in 24 hours., Disp: 9 tablet, Rfl: 11   pantoprazole (PROTONIX) 40 MG tablet, TAKE 1 TABLET BY MOUTH EVERY DAY, Disp: 90 tablet, Rfl: 1   Probiotic Product (PROBIOTIC BLEND PO), Take by mouth. [place] Prebiotic, Disp: , Rfl:    sucralfate (CARAFATE) 1 GM/10ML suspension, TAKE 10 MLS (1 G TOTAL) BY MOUTH 2 (TWO) TIMES DAILY., Disp: 180 mL, Rfl: 1   traMADol (ULTRAM) 50 MG tablet, TAKE 1 TABLET BY MOUTH EVERY 8 HOURS AS NEEDED, Disp: 90 tablet, Rfl: 0   valACYclovir (VALTREX) 1000 MG tablet, Take by mouth., Disp: , Rfl:    hydrOXYzine (ATARAX) 25 MG tablet, Take 1 tablet (25 mg total) by mouth daily as needed.,  Disp: 30 tablet, Rfl: 1  Current Facility-Administered Medications:    methylPREDNISolone acetate (DEPO-MEDROL) injection 80 mg, 80 mg, Intramuscular, Once,   Allergies  Allergen Reactions   Codeine Nausea And Vomiting    Other reaction(s): extreme stomach upset, GI Intolerance   Garlic Nausea And Vomiting   Other Nausea And Vomiting    Onions    Sertraline Rash    Other reaction(s): Confusion Other reaction(s): confusion, Other (See Comments) Other reaction(s): Confusion    Methotrexate     Other reaction(s): elevated LFT's Other reaction(s): elevated LFT's    ROS neg/noncontributory except as noted HPI/below Migraine today so that is why bp is up.  Usu ok     Objective:     BP (!) 164/102 (BP Location: Left Arm, Patient Position: Sitting, Cuff Size: Normal)   Temp 98.3 F (36.8 C) (Temporal)   Resp 16   Ht 5' 7.5" (1.715 m)   Wt 159 lb (72.1 kg)   SpO2 99%   BMI 24.54 kg/m  Wt Readings from Last 3 Encounters:  05/29/23 159 lb (72.1 kg)  05/17/23 160 lb (72.6 kg)  04/18/23 157 lb 6.4 oz (71.4 kg)    Physical Exam   Gen: WDWN NAD HEENT: NCAT, conjunctiva not injected, sclera nonicteric EXT:  no edema MSK: no gross abnormalities.  NEURO: A&O x3.  CN II-XII intact.  PSYCH: normal mood. Good eye contact LLE-rash as below.  Similar on RLE, arms, trunk.   On hands on dorsum in first web area-raised, pink, annular rash.      Reviewd notes Assessment & Plan:  Rash -     Sedimentation rate -     CBC with Differential/Platelet -     Comprehensive metabolic panel -     RPR -     Alpha-Gal Panel  Other orders -     Doxycycline Hyclate; Take 1 tablet (100 mg total) by mouth 2 (two) times daily.  Dispense: 20 tablet; Refill: 0 -     hydrOXYzine HCl; Take 1 tablet (25 mg total) by mouth daily as needed.  Dispense: 30 tablet; Refill: 1   Rash-not sure of etiology.  I am not convinced it is all bug bites that are still present from May.  Not sure if it is an  autoimmune thing, other reaction, infectious, other.  She continues to get new spots on other areas of the body.  Will trial doxycycline 100 mg twice daily.  Will check sed rate, CBC, CMP, syphilis, alpha gal.  Also hydroxyzine 25 mg should  be 3 times daily as needed.  She has follow-up with dermatology.  Question need for biopsy.  There is also a different rash on her dorsum of her hands.  Antifungal cream worked previously which was prescribed by dermatology.  Advised to retrial it.  I do not want to do more steroids as those did not seem to help before.  Return if symptoms worsen or fail to improve.  Angelena Sole, MD

## 2023-05-29 NOTE — Patient Instructions (Signed)
Doxycycline send and hydoxyzine.

## 2023-05-30 LAB — CBC WITH DIFFERENTIAL/PLATELET
Basophils Absolute: 0.1 10*3/uL (ref 0.0–0.1)
Basophils Relative: 1.4 % (ref 0.0–3.0)
Eosinophils Absolute: 0.3 10*3/uL (ref 0.0–0.7)
Eosinophils Relative: 4.7 % (ref 0.0–5.0)
HCT: 40.5 % (ref 36.0–46.0)
Hemoglobin: 13.2 g/dL (ref 12.0–15.0)
Lymphocytes Relative: 17.5 % (ref 12.0–46.0)
Lymphs Abs: 1.1 10*3/uL (ref 0.7–4.0)
MCHC: 32.5 g/dL (ref 30.0–36.0)
MCV: 89.2 fL (ref 78.0–100.0)
Monocytes Absolute: 0.5 10*3/uL (ref 0.1–1.0)
Monocytes Relative: 7.5 % (ref 3.0–12.0)
Neutro Abs: 4.4 10*3/uL (ref 1.4–7.7)
Neutrophils Relative %: 68.9 % (ref 43.0–77.0)
Platelets: 314 10*3/uL (ref 150.0–400.0)
RBC: 4.54 Mil/uL (ref 3.87–5.11)
RDW: 12.6 % (ref 11.5–15.5)
WBC: 6.4 10*3/uL (ref 4.0–10.5)

## 2023-05-30 LAB — COMPREHENSIVE METABOLIC PANEL
ALT: 22 U/L (ref 0–35)
AST: 23 U/L (ref 0–37)
Albumin: 4.2 g/dL (ref 3.5–5.2)
Alkaline Phosphatase: 73 U/L (ref 39–117)
BUN: 18 mg/dL (ref 6–23)
CO2: 31 meq/L (ref 19–32)
Calcium: 9 mg/dL (ref 8.4–10.5)
Chloride: 100 meq/L (ref 96–112)
Creatinine, Ser: 0.76 mg/dL (ref 0.40–1.20)
GFR: 80.87 mL/min (ref >60.00)
Glucose, Bld: 88 mg/dL (ref 70–99)
Potassium: 3.9 meq/L (ref 3.5–5.1)
Sodium: 137 meq/L (ref 135–145)
Total Bilirubin: 0.4 mg/dL (ref 0.2–1.2)
Total Protein: 6.9 g/dL (ref 6.0–8.3)

## 2023-05-30 LAB — SEDIMENTATION RATE: Sed Rate: 4 mm/h (ref 0–30)

## 2023-05-30 NOTE — Progress Notes (Signed)
Labs are normal.  Alpha gall is still pending

## 2023-06-01 ENCOUNTER — Other Ambulatory Visit: Payer: Self-pay | Admitting: Physician Assistant

## 2023-06-01 DIAGNOSIS — K2970 Gastritis, unspecified, without bleeding: Secondary | ICD-10-CM

## 2023-06-01 LAB — ALPHA-GAL PANEL
Allergen, Mutton, f88: <0.1 kU/L
Allergen, Pork, f26: <0.1 kU/L
Beef: <0.1 kU/L
CLASS: 0
CLASS: 0
Class: 0
GALACTOSE-ALPHA-1,3-GALACTOSE IGE*: <0.1 kU/L (ref <0.10)

## 2023-06-01 LAB — RPR: RPR Ser Ql: NONREACTIVE

## 2023-06-01 LAB — INTERPRETATION:

## 2023-06-01 NOTE — Progress Notes (Signed)
Alpha gal negative.  Needs to see derm

## 2023-06-29 ENCOUNTER — Telehealth: Payer: Self-pay | Admitting: Adult Health

## 2023-06-29 NOTE — Telephone Encounter (Signed)
 Pt called needing a refill on her  naratriptan (AMERGE) 2.5 MG tablet and is needing it sent to the [facility] on [facility]

## 2023-07-01 ENCOUNTER — Other Ambulatory Visit: Payer: Self-pay | Admitting: Physician Assistant

## 2023-07-01 DIAGNOSIS — G8929 Other chronic pain: Secondary | ICD-10-CM

## 2023-07-02 ENCOUNTER — Other Ambulatory Visit: Payer: Self-pay | Admitting: Family Medicine

## 2023-07-02 NOTE — Telephone Encounter (Signed)
 Last OV: 05/29/23  Next OV: None  Last Filled: 05/28/23  Quantity: 90

## 2023-07-02 NOTE — Telephone Encounter (Signed)
Called patient and LVM to give us a call back

## 2023-07-03 NOTE — Telephone Encounter (Signed)
 Called pt and she informed me that she went to pick up her Rx yesterday and she knows she has refills. Pt verbalized understanding. Pt had no questions at this time but was encouraged to call back if questions arise.

## 2023-07-11 ENCOUNTER — Ambulatory Visit (INDEPENDENT_AMBULATORY_CARE_PROVIDER_SITE_OTHER): Payer: Medicare Other | Admitting: Adult Health

## 2023-07-11 DIAGNOSIS — G43701 Chronic migraine without aura, not intractable, with status migrainosus: Secondary | ICD-10-CM

## 2023-07-11 DIAGNOSIS — G43109 Migraine with aura, not intractable, without status migrainosus: Secondary | ICD-10-CM

## 2023-07-11 MED ORDER — ONABOTULINUMTOXINA 200 UNITS IJ SOLR
155.0000 [IU] | Freq: Once | INTRAMUSCULAR | Status: AC
  Administered 2023-07-11: 155 [IU] via INTRAMUSCULAR

## 2023-07-11 NOTE — Progress Notes (Signed)
 Patient returns for repeat botox . Prior injection on 04/16/2023.  She reports great benefit with Botox  injection.  She has only had 1 migraine headache since receiving injection and was not as severe.  Previously experiencing daily headaches.  Use of naratriptan  with benefit.  Tolerated procedure well today.  Return in 3 months for repeat injection.         Consent Form Botulism Toxin Injection For Chronic Migraine    Reviewed orally with patient, additionally signature is on file:  Botulism toxin has been approved by the Federal drug administration for treatment of chronic migraine. Botulism toxin does not cure chronic migraine and it may not be effective in some patients.  The administration of botulism toxin is accomplished by injecting a small amount of toxin into the muscles of the neck and head. Dosage must be titrated for each individual. Any benefits resulting from botulism toxin tend to wear off after 3 months with a repeat injection required if benefit is to be maintained. Injections are usually done every 3-4 months with maximum effect peak achieved by about 2 or 3 weeks. Botulism toxin is expensive and you should be sure of what costs you will incur resulting from the injection.  The side effects of botulism toxin use for chronic migraine may include:   -Transient, and usually mild, facial weakness with facial injections  -Transient, and usually mild, head or neck weakness with head/neck injections  -Reduction or loss of forehead facial animation due to forehead muscle weakness  -Eyelid drooping  -Dry eye  -Pain at the site of injection or bruising at the site of injection  -Double vision  -Potential unknown long term risks   Contraindications: You should not have Botox  if you are pregnant, nursing, allergic to albumin, have an infection, skin condition, or muscle weakness at the site of the injection, or have myasthenia gravis, Lambert-Eaton syndrome, or  ALS.  It is also possible that as with any injection, there may be an allergic reaction or no effect from the medication. Reduced effectiveness after repeated injections is sometimes seen and rarely infection at the injection site may occur. All care will be taken to prevent these side effects. If therapy is given over a long time, atrophy and wasting in the muscle injected may occur. Occasionally the patient's become refractory to treatment because they develop antibodies to the toxin. In this event, therapy needs to be modified.  I have read the above information and consent to the administration of botulism toxin.    BOTOX  PROCEDURE NOTE FOR MIGRAINE HEADACHE  Contraindications and precautions discussed with patient(above). Aseptic procedure was observed and patient tolerated procedure. Procedure performed by Johny Nap, AGNP-BC.   The condition has existed for more than 6 months, and pt does not have a diagnosis of ALS, Myasthenia Gravis or Lambert-Eaton Syndrome.  Risks and benefits of injections discussed and pt agrees to proceed with the procedure.  Written consent obtained  These injections are medically necessary. Pt  receives good benefits from these injections. These injections do not cause sedations or hallucinations which the oral therapies may cause.   Description of procedure:  The patient was placed in a sitting position. The standard protocol was used for Botox  as follows, with 5 units of Botox  injected at each site:  -Procerus muscle, midline injection  -Corrugator muscle, bilateral injection  -Frontalis muscle, bilateral injection, with 2 sites each side, medial injection was performed in the upper one third of the frontalis muscle, in the  region vertical from the medial inferior edge of the superior orbital rim. The lateral injection was again in the upper one third of the forehead vertically above the lateral limbus of the cornea, 1.5 cm lateral to the medial  injection site.  -Temporalis muscle injection, 4 sites, bilaterally. The first injection was 3 cm above the tragus of the ear, second injection site was 1.5 cm to 3 cm up from the first injection site in line with the tragus of the ear. The third injection site was 1.5-3 cm forward between the first 2 injection sites. The fourth injection site was 1.5 cm posterior to the second injection site. 5th site laterally in the temporalis  muscleat the level of the outer canthus.  -Occipitalis muscle injection, 3 sites, bilaterally. The first injection was done one half way between the occipital protuberance and the tip of the mastoid process behind the ear. The second injection site was done lateral and superior to the first, 1 fingerbreadth from the first injection. The third injection site was 1 fingerbreadth superiorly and medially from the first injection site.  -Cervical paraspinal muscle injection, 2 sites, bilaterally. The first injection site was 1 cm from the midline of the cervical spine, 3 cm inferior to the lower border of the occipital protuberance. The second injection site was 1.5 cm superiorly and laterally to the first injection site.  -Trapezius muscle injection was performed at 3 sites, bilaterally. The first injection site was in the upper trapezius muscle halfway between the inflection point of the neck, and the acromion. The second injection site was one half way between the acromion and the first injection site. The third injection was done between the first injection site and the inflection point of the neck.    A total of 200 units of Botox  was prepared, 155 units of Botox  was injected as documented above, any Botox  not injected was wasted. The patient tolerated the procedure well, there were no complications of the above procedure.   Johny Nap, AGNP-BC  [facility] [address] [place], [place] [address]  Phone [phone] Fax  [phone] Note: This document was prepared with digital dictation and possible smart phrase technology. Any transcriptional errors that result from this process are unintentional.

## 2023-07-11 NOTE — Progress Notes (Signed)
 Botox - 200 units x 1 vial Lot: [id number] Expiration: 03//[id number] NDC: 1610-9604-54  Bacteriostatic 0.9% Sodium Chloride - * mL  Lot: [id number] Expiration: 08/11/2023 NDC: 1478-2956-21  Dx: H08.657 B/B Witnessed by Santana Cue, CMA

## 2023-07-17 ENCOUNTER — Other Ambulatory Visit (HOSPITAL_COMMUNITY)
Admission: RE | Admit: 2023-07-17 | Discharge: 2023-07-17 | Disposition: A | Discharge status: Home / Self Care | Payer: Medicare Other | Source: Other Acute Inpatient Hospital | Attending: Obstetrics and Gynecology | Admitting: Obstetrics and Gynecology

## 2023-07-17 ENCOUNTER — Ambulatory Visit (INDEPENDENT_AMBULATORY_CARE_PROVIDER_SITE_OTHER): Payer: Medicare Other

## 2023-07-17 VITALS — BP 126/66 | HR 85

## 2023-07-17 DIAGNOSIS — R82998 Other abnormal findings in urine: Secondary | ICD-10-CM | POA: Insufficient documentation

## 2023-07-17 DIAGNOSIS — R350 Frequency of micturition: Secondary | ICD-10-CM

## 2023-07-17 LAB — URINALYSIS, ROUTINE W REFLEX MICROSCOPIC
Bilirubin Urine: NEGATIVE
Glucose, UA: NEGATIVE mg/dL
Hgb urine dipstick: NEGATIVE
Ketones, ur: NEGATIVE mg/dL
Nitrite: NEGATIVE
Protein, ur: 30 mg/dL — AB
Specific Gravity, Urine: 1.024 (ref 1.005–1.030)
WBC, UA: >50 WBC/hpf (ref 0–5)
pH: 5 (ref 5.0–8.0)

## 2023-07-17 LAB — POCT URINALYSIS DIPSTICK
Glucose, UA: NEGATIVE
Nitrite, UA: NEGATIVE
Protein, UA: POSITIVE — AB
Spec Grav, UA: >=1.03 — AB (ref 1.010–1.025)
Urobilinogen, UA: 0.2 U/dL
pH, UA: 6 (ref 5.0–8.0)

## 2023-07-17 MED ORDER — NITROFURANTOIN MONOHYD MACRO 100 MG PO CAPS: 100.0000 mg | ORAL_CAPSULE | Freq: Two times a day (BID) | ORAL | 0 refills | Status: DC

## 2023-07-17 NOTE — Patient Instructions (Signed)
Your Urine dip that was done in office was Positive. I am sending the urine off for culture and you can take AZO over the counter for your discomfort.  We will contact you when the results are back between 3-5 days. If a different antibiotic is needed we will sent the order to the pharmacy and you will be notified. If you have any questions or concerns please feel free to call us at [phone]

## 2023-07-17 NOTE — Progress Notes (Signed)
Tina Pena is a 68 y.o. female arrived today with UTI sx.  Per Dr. Jari Favre protocol: A urine specimen was collected and POCT Urine was done and urine culture sent to the lab. POCT Urine was Positive  Pt was notified and prescription sent to the preferred pharmacy.

## 2023-07-19 LAB — URINE CULTURE: Culture: 30000 — AB

## 2023-07-31 ENCOUNTER — Ambulatory Visit: Payer: Medicare Other | Admitting: Internal Medicine

## 2023-08-07 ENCOUNTER — Other Ambulatory Visit: Payer: Self-pay | Admitting: Physician Assistant

## 2023-08-08 ENCOUNTER — Other Ambulatory Visit: Payer: Self-pay | Admitting: Physician Assistant

## 2023-08-08 DIAGNOSIS — G8929 Other chronic pain: Secondary | ICD-10-CM

## 2023-08-08 NOTE — Telephone Encounter (Signed)
Last OV: 05/29/23  Next OV: None  Last Filled: 07/02/23  Quantity: 90

## 2023-08-13 ENCOUNTER — Telehealth: Payer: Self-pay | Admitting: Adult Health

## 2023-08-13 MED ORDER — NURTEC 75 MG PO TBDP
75.0000 mg | ORAL_TABLET | ORAL | 11 refills | Status: DC | PRN
Start: 2023-08-13 — End: 2023-08-13

## 2023-08-13 MED ORDER — UBRELVY 100 MG PO TABS: 100.0000 mg | ORAL_TABLET | ORAL | 5 refills | Status: DC | PRN

## 2023-08-13 NOTE — Telephone Encounter (Signed)
 Called the patient back. She states that she has had day 3 of migraine. Its not as bad as they used to be prior to starting botox but its causing difficulty with concentrating, vision difficulties and pain. She has taken the naratriptan 3x, and it has not knocked it out. She has tried and failed a lot of medications and nurtec worked really well for her but insurance denied before she came here. She has not tried [place] yet  I have gotten her RX insurance information In case would like to try a different medication to take as needed.   [employer] ID- [id number] RX BIN: [id number] RX PCN- [id number] RX GROUP- [id number] Phone # [phone]

## 2023-08-13 NOTE — Telephone Encounter (Signed)
 PA completed for nurtec sent to [facility]. Completed on CMM KEY: [id number] Will await determination

## 2023-08-13 NOTE — Telephone Encounter (Signed)
 Pt reports she is having a 3 day migraine, the naratriptan (AMERGE) 2.5 MG tablet is not working , she is asking if something else can be called in for her, please call.

## 2023-08-13 NOTE — Addendum Note (Signed)
 Addended by: Judi Cong on: 08/13/2023 05:35 PM   Modules accepted: Orders

## 2023-08-13 NOTE — Telephone Encounter (Signed)
 Lets see if we can get Nurtec approved now as this was previously helpful. If they still decline, can consider Ubrelvy. Please advise patient to not take more than 1 tab in 24 hours or more than 2-3x per week. Order will be placed. Thank you.

## 2023-08-14 MED ORDER — UBRELVY 100 MG PO TABS: 100.0000 mg | ORAL_TABLET | ORAL | Status: DC | PRN

## 2023-08-14 NOTE — Addendum Note (Signed)
 Addended by: Judi Cong on: 08/14/2023 07:37 AM   Modules accepted: Orders

## 2023-08-14 NOTE — Telephone Encounter (Signed)
 Approval on ubrelvy for the patient until 06/25/2024

## 2023-08-27 LAB — HM MAMMOGRAPHY

## 2023-08-27 NOTE — Patient Instructions (Incomplete)
 Pruritus Rash - SPT 03/2023: negative to environmental allergens (pollens, molds, cats, dogs, dust mites, cockroach)  - I am not convinced this is drug related (5-FU) since the rash has been diffuse and not just located under the eye and even with quick discontinuation, it has not improved. The localized irritation under the eye may have been related.   - Tried oral anti fungal Fluconazole x2 weeks without response.   - Do a daily soaking tub bath in warm water for 10-15 minutes.  - Use a gentle, unscented cleanser at the end of the bath (such as Dove unscented bar or baby wash, or Aveeno sensitive body wash). Then rinse, pat half-way dry, and apply a gentle, unscented moisturizer cream or ointment (Cerave, Cetaphil, Eucerin, Aveeno)  all over while still damp. Dry skin makes the itching worse. The skin should be moisturized with a gentle, unscented moisturizer at least twice daily.  - Use only unscented liquid laundry detergent. - Follow back up with Dermatology.  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 -Continue Claritin 10mg  daily for migraine prevention.

## 2023-08-29 ENCOUNTER — Encounter: Payer: Self-pay | Admitting: Family

## 2023-08-29 ENCOUNTER — Ambulatory Visit (INDEPENDENT_AMBULATORY_CARE_PROVIDER_SITE_OTHER): Payer: Medicare Other | Admitting: Family

## 2023-08-29 ENCOUNTER — Other Ambulatory Visit: Payer: Self-pay

## 2023-08-29 VITALS — BP 108/64 | HR 87 | Temp 97.5°F | Resp 15

## 2023-08-29 DIAGNOSIS — T781XXD Other adverse food reactions, not elsewhere classified, subsequent encounter: Secondary | ICD-10-CM | POA: Diagnosis not present

## 2023-08-29 DIAGNOSIS — T781XXA Other adverse food reactions, not elsewhere classified, initial encounter: Secondary | ICD-10-CM

## 2023-08-29 MED ORDER — EPINEPHRINE 0.3 MG/0.3ML IJ SOAJ: 0.3000 mg | INTRAMUSCULAR | 1 refills | Status: DC | PRN

## 2023-08-29 NOTE — Progress Notes (Signed)
 [address] [place] [address] Dept: [phone]  FOLLOW UP NOTE  Patient ID: Tina Pena, female    DOB: 03-Dec-1955  Age: 68 y.o. MRN: [id number] Date of Office Visit: 08/29/2023  Assessment  Chief Complaint: Pruritus and Advice Only  HPI Tina Pena is a 68 year old female who presents today for possible food allergy.  She was last seen on April 25, 2023 by Dr. Allena Katz for pruritus/rash.  She denies any new diagnosis or surgeries since her last office visit.  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  She removed these foods from her diet and did not have any testing.  She then questions maybe she has an aversion to these foods.  She does not have an epinephrine autoinjector device.  She reports if she smells mostly garlic she will get a severe headache.  She will  pass out, have nausea, and vomiting if she ingests garlic and onion.  She mentions that these symptoms occur more with garlic though.  She is leaving the end of April and will be gone for approximately 3 weeks due to going on a vacation to [place] and [place]. She is worried about their food containing garlic and onion and not know what she should do. She reports that does have a piece of paper in Spanish saying that she is allergic to garlic and onion.  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   Drug Allergies:  Allergies  Allergen Reactions   Codeine Nausea And Vomiting    Other reaction(s): extreme stomach upset, GI Intolerance   Garlic Nausea And Vomiting   Other Nausea And Vomiting    Onions    Sertraline Rash    Other reaction(s): Confusion Other reaction(s): confusion, Other (See Comments) Other reaction(s): Confusion    Methotrexate     Other reaction(s): elevated LFT's Other reaction(s): elevated LFT's     Review of  Systems: Negative except as per HPI   Physical Exam: BP 108/64 (BP Location: Left Arm, Patient Position: Sitting, Cuff Size: Normal)   Pulse 87   Temp (!) 97.5 F (36.4 C) (Temporal)   Resp 15   SpO2 100%    Physical Exam Constitutional:      Appearance: Normal appearance.  HENT:     Head: Normocephalic and atraumatic.     Comments: Pharynx normal. Eyes normal. Ears normal. Nose normal    Right Ear: Tympanic membrane, ear canal and external ear normal.     Left Ear: Tympanic membrane, ear canal and external ear normal.     Nose: Nose normal.     Mouth/Throat:     Mouth: Mucous membranes are moist.     Pharynx: Oropharynx is clear.  Eyes:     Conjunctiva/sclera: Conjunctivae normal.  Cardiovascular:     Rate and Rhythm: Regular rhythm.     Heart sounds: Normal heart sounds.  Pulmonary:     Effort: Pulmonary effort is normal.     Breath sounds: Normal breath sounds.     Comments: Lungs clear to auscultation Musculoskeletal:     Cervical back: Neck supple.  Skin:    General: Skin is warm.  Neurological:     Mental Status: She is alert and oriented to person, place, and time.  Psychiatric:        Mood and Affect: Mood normal.  Behavior: Behavior normal.        Thought Content: Thought content normal.        Judgment: Judgment normal.     Diagnostics:  None  Assessment and Plan: 1. Adverse food reaction, initial encounter     Meds ordered this encounter  Medications   EPINEPHrine 0.3 mg/0.3 mL IJ SOAJ injection    Sig: Inject 0.3 mg into the muscle as needed.    Dispense:  2 each    Refill:  1    Patient Instructions   Possible food allergy We will get lab work due to recent antihistamine use. We will call you with results once they are back. Continue to strictly avoid garlic and onion. In case of an allergic reaction, give Benadryl 4 teaspoonfuls every 4 hours, and if life-threatening symptoms occur, inject with EpiPen 0.3 mg. Emergency Action Plan  given and reviewed EpiPen demonstration given Form for EpiPen on flight given  Follow up in 6 months or sooner if needed   Return in about 6 months (around 02/29/2024), or if symptoms worsen or fail to improve.    Thank you for the opportunity to care for this patient.  Please do not hesitate to contact me with questions.  Nehemiah Reino, FNP [facility]

## 2023-09-01 LAB — ALLERGEN, GARLIC, F47: Allergen Garlic IgE: 0.7 kU/L — AB

## 2023-09-01 LAB — ALLERGEN, ONION, F48: Allergen Onion IgE: 0.3 kU/L — AB

## 2023-09-02 NOTE — Progress Notes (Signed)
 Please let Nayely know that her lab results came back.  Garlic: came back moderately elevated. Continue to avoid garlic and have access to your epinephrine auto injector device at all times.   Onion: came back low. Continue to avoid onion and have access to your epinephrine auto injector device at all times.

## 2023-09-07 ENCOUNTER — Ambulatory Visit (INDEPENDENT_AMBULATORY_CARE_PROVIDER_SITE_OTHER): Payer: Medicare Other | Admitting: Physician Assistant

## 2023-09-07 ENCOUNTER — Encounter: Payer: Self-pay | Admitting: Physician Assistant

## 2023-09-07 VITALS — BP 118/74 | HR 78 | Temp 97.3°F | Ht 67.5 in | Wt 160.8 lb

## 2023-09-07 DIAGNOSIS — G8929 Other chronic pain: Secondary | ICD-10-CM | POA: Diagnosis not present

## 2023-09-07 DIAGNOSIS — K219 Gastro-esophageal reflux disease without esophagitis: Secondary | ICD-10-CM | POA: Diagnosis not present

## 2023-09-07 DIAGNOSIS — M542 Cervicalgia: Secondary | ICD-10-CM

## 2023-09-07 DIAGNOSIS — L989 Disorder of the skin and subcutaneous tissue, unspecified: Secondary | ICD-10-CM

## 2023-09-07 MED ORDER — TRAMADOL HCL 50 MG PO TABS: 50.0000 mg | ORAL_TABLET | Freq: Three times a day (TID) | ORAL | 0 refills | Status: DC | PRN

## 2023-09-07 MED ORDER — SUCRALFATE 1 G PO TABS: 1.0000 g | ORAL_TABLET | Freq: Two times a day (BID) | ORAL | 1 refills | Status: AC

## 2023-09-07 NOTE — Progress Notes (Signed)
 Patient ID: Tina Pena, female    DOB: 1956-06-20, 68 y.o.   MRN: [id number]   Assessment & Plan:  Neck pain, chronic -     traMADol HCl; Take 1 tablet (50 mg total) by mouth every 8 (eight) hours as needed.  Dispense: 90 tablet; Refill: 0  Gastroesophageal reflux disease, unspecified whether esophagitis present  Other orders -     Sucralfate; Take 1 tablet (1 g total) by mouth 2 (two) times daily.  Dispense: 180 tablet; Refill: 1   Assessment and Plan    Medication Management for Tramadol Stable - chronic neck - 50 mg up to TID prn mod -severe pain Current prescription due for refill. She has leftover medication from the last refill. - Refill tramadol prescription with 90 tablets for the trip. - Instruct her to message around April 14th or 15th for refill before the trip. - PDMP reviewed today, no red flags, filling appropriately.    Chronic Foot Lesion Chronic blister or cyst-like lesion on left foot heel, not a wart. Advised against procedures before the trip to avoid complications. - Advise using extra cushioning around the lesion during the trip. - Consider referral to podiatry for evaluation and possible removal after the trip if desired.  GERD Stable, chronic - Prescribe Carafate tablets, 1 gram twice a day, with a 90-day supply for ease of travel.         Return in about 4 months (around 01/07/2024) for recheck/follow-up.    Subjective:    Chief Complaint  Patient presents with   Medication Refill    Pt in office for medication refill pt is going on vacation and wants to make sure she has enough meds for her trip. Needing tramadol as well as Carafate pills instead of liquid.    Medication Refill   Discussed the use of AI scribe software for clinical note transcription with the patient, who gave verbal consent to proceed.  History of Present Illness   Tina Pena is a 68 year old female who presents for medication management before a  trip.  She is planning a trip to [place] and [place] to walk the [place], a pilgrimage route, from April 24th to May 17th. She is currently training for the trip by walking five days a week.  She experiences knee and neck pain due to increased physical activity, though it is not severe. She uses tramadol for pain management, taking it less than three times a day, and has plenty left from her last prescription. She is concerned about the prescription expiring on April 14th and wants to ensure she has enough for her trip.  She has a history of gastroesophageal reflux disease (GERD) and uses Carafate for management. She prefers tablets over liquid for ease of travel.  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 She experiences severe reactions to garlic, including sickness and vomiting, and has a card in [place] and Spanish to communicate her allergies while traveling. She has arranged for meals that accommodate her dietary restrictions.  She mentions a persistent blister or cyst on her left foot, which sometimes pops and sometimes does not. It has been present for a long time and does not cause significant issues, even after walking over a hundred miles in her shoes. She plans to use extra cushioning to manage it during her trip.       Past Medical History:  Diagnosis Date   Acid reflux    Allergy  Anal fissure    Anxiety    Arthritis    Barrett's esophagus    Bipolar disorder (HCC)    Cervical herniated disc    Chronic headaches    Chronic pain    Depression    Dermatomyositis (HCC)    GERD (gastroesophageal reflux disease)    H/O vitamin D deficiency    Hypercholesterolemia    IBS (irritable bowel syndrome)    Ocular migraine    Osteoporosis     Past Surgical History:  Procedure Laterality Date   APPENDECTOMY     ESOPHAGOGASTRIC FUNDOPLICATION     GALLBLADDER SURGERY  2001   NERVE SURGERY Right 03/07/2023   [facility]- Right ARM   RETINAL  DETACHMENT SURGERY Right 11/29/2021   TONSILLECTOMY  1981   toupetfundoplicatio  2000   TUBAL LIGATION      Family History  Problem Relation Age of Onset   Cancer Mother    Lung cancer Mother    COPD Father    Cancer Father        breast   Bipolar disorder Father    Anxiety disorder Father    Depression Father    Paranoid behavior Father    Heart disease Father    Stroke Maternal Grandmother    Hypertension Maternal Grandmother    Bipolar disorder Maternal Grandmother    Dementia Maternal Grandmother    Hypertension Maternal Grandfather    Stroke Paternal Grandmother    Hypertension Paternal Grandmother    Bipolar disorder Paternal Grandmother    Dementia Paternal Grandmother    Hypertension Paternal Grandfather    Stomach cancer Neg Hx    Colon cancer Neg Hx    Esophageal cancer Neg Hx    Rectal cancer Neg Hx     Social History   Tobacco Use   Smoking status: Never    Passive exposure: Past   Smokeless tobacco: Never  Vaping Use   Vaping status: Never Used  Substance Use Topics   Alcohol use: Yes    Alcohol/week: 2.0 standard drinks of alcohol    Types: 2 Glasses of wine per week    Comment: occasional glass of wine   Drug use: Never     Allergies  Allergen Reactions   Codeine Nausea And Vomiting    Other reaction(s): extreme stomach upset, GI Intolerance   Garlic Nausea And Vomiting   Other Nausea And Vomiting    Onions    Sertraline Rash    Other reaction(s): Confusion Other reaction(s): confusion, Other (See Comments) Other reaction(s): Confusion    Methotrexate     Other reaction(s): elevated LFT's Other reaction(s): elevated LFT's     Review of Systems NEGATIVE UNLESS OTHERWISE INDICATED IN HPI      Objective:     BP 118/74   Pulse 78   Temp (!) 97.3 F (36.3 C) (Temporal)   Ht 5' 7.5" (1.715 m)   Wt 160 lb 12.8 oz (72.9 kg)   SpO2 98%   BMI 24.81 kg/m   Wt Readings from Last 3 Encounters:  09/07/23 160 lb 12.8 oz (72.9 kg)   05/29/23 159 lb (72.1 kg)  05/17/23 160 lb (72.6 kg)    BP Readings from Last 3 Encounters:  09/07/23 118/74  08/29/23 108/64  07/17/23 126/66     Physical Exam Vitals and nursing note reviewed.  Constitutional:      Appearance: Normal appearance.  Eyes:     Extraocular Movements: Extraocular movements intact.     Conjunctiva/sclera:  Conjunctivae normal.     Pupils: Pupils are equal, round, and reactive to light.  Cardiovascular:     Rate and Rhythm: Normal rate and regular rhythm.  Pulmonary:     Effort: Pulmonary effort is normal.     Breath sounds: Normal breath sounds.  Skin:    Findings: Lesion (left posterior heel there is a less than 0.5 cm mobile, soft, non-tender subcutaneous cyst like structure) present.  Neurological:     Mental Status: She is alert.  Psychiatric:        Mood and Affect: Mood normal.        Behavior: Behavior normal.       Jenner Rosier M Kennedee Kitzmiller, PA-C

## 2023-09-12 ENCOUNTER — Encounter: Payer: Self-pay | Admitting: Obstetrics and Gynecology

## 2023-09-12 ENCOUNTER — Ambulatory Visit (INDEPENDENT_AMBULATORY_CARE_PROVIDER_SITE_OTHER): Payer: Medicare Other

## 2023-09-12 VITALS — Ht 68.0 in | Wt 160.0 lb

## 2023-09-12 DIAGNOSIS — Z0000 Encounter for general adult medical examination without abnormal findings: Secondary | ICD-10-CM

## 2023-09-12 NOTE — Progress Notes (Signed)
 Subjective:   Tina Pena is a 68 y.o. who presents for a Medicare Wellness preventive visit.  Visit Complete: Virtual I connected with  [name] on 09/12/23 by a audio enabled telemedicine application and verified that I am speaking with the correct person using two identifiers.  Patient Location: Home  Provider Location: Home Office  I discussed the limitations of evaluation and management by telemedicine. The patient expressed understanding and agreed to proceed.  Vital Signs: Because this visit was a virtual/telehealth visit, some criteria may be missing or patient reported. Any vitals not documented were not able to be obtained and vitals that have been documented are patient reported.  VideoDeclined- This patient declined Librarian, academic. Therefore the visit was completed with audio only.  Persons Participating in Visit: Patient.  AWV Questionnaire: Yes: Patient Medicare AWV questionnaire was completed by the patient on 09/05/23; I have confirmed that all information answered by patient is correct and no changes since this date.  Cardiac Risk Factors include: advanced age (>48men, >13 women)     Objective:    Today's Vitals   09/12/23 1017 09/12/23 1018  Weight: 160 lb (72.6 kg)   Height: 5\' 8"  (1.727 m)   PainSc:  6    Body mass index is 24.33 kg/m.     07/17/2022    3:40 PM 06/02/2022   12:40 PM 03/24/2022   12:29 PM 01/17/2018    2:51 PM  Advanced Directives  Does Patient Have a Medical Advance Directive? Yes Yes Yes   Type of Estate agent of [place];Living will Healthcare Power of [place];Living will    Does patient want to make changes to medical advance directive? No - Patient declined     Copy of Healthcare Power of Attorney in Chart? No - copy requested No - copy requested       Information is confidential and restricted. Go to Review Flowsheets to unlock data.    Current Medications  (verified) Outpatient Encounter Medications as of 09/12/2023  Medication Sig   AMBULATORY NON FORMULARY MEDICATION Medication Name: Diltiazem 2% cream w/Lidocaine 5 % cream - Using your index finger, apply a small amount of medication inside the rectum up to your first knuckle/joint three times daily x 6-8 weeks.   atomoxetine (STRATTERA) 40 MG capsule Take 40 mg by mouth every morning.   clonazePAM (KLONOPIN) 1 MG tablet Take 1 mg by mouth at bedtime as needed.   CYMBALTA 60 MG capsule 2 capsules Orally Once a day   estradiol (ESTRACE) 0.1 MG/GM vaginal cream Place 0.5 g vaginally 2 (two) times a week. Place 0.5g nightly for two weeks then twice a week after   hydroxychloroquine (PLAQUENIL) 200 MG tablet Take 200 mg by mouth 2 (two) times daily.   hydrOXYzine (ATARAX) 25 MG tablet TAKE 1 TABLET BY MOUTH DAILY AS NEEDED.   inFLIXimab (REMICADE) 100 MG injection 7mg /kg   loratadine-pseudoephedrine (CLARITIN-D 24 HOUR) 10-240 MG 24 hr tablet 1 tablet as needed Orally Once a day for 30 day(s)   Multiple Vitamins-Minerals (MULTI ADULT GUMMIES PO) Take by mouth.   pantoprazole (PROTONIX) 40 MG tablet TAKE 1 TABLET BY MOUTH EVERY DAY   Probiotic Product (PROBIOTIC BLEND PO) Take by mouth. Darrold Junker Prebiotic   sucralfate (CARAFATE) 1 g tablet Take 1 tablet (1 g total) by mouth 2 (two) times daily.   traMADol (ULTRAM) 50 MG tablet Take 1 tablet (50 mg total) by mouth every 8 (eight) hours as needed.  Ubrogepant (UBRELVY) 100 MG TABS Take 1 tablet (100 mg total) by mouth as needed (Take 1 tab at onset of migraine, may repeat additional tablet in 2 hours if needed (do not exceed more than 2 tab in 24 hrs)).   valACYclovir (VALTREX) 1000 MG tablet Take by mouth as needed.   EPINEPHrine 0.3 mg/0.3 mL IJ SOAJ injection Inject 0.3 mg into the muscle as needed. (Patient not taking: Reported on 09/12/2023)   Facility-Administered Encounter Medications as of 09/12/2023  Medication   methylPREDNISolone acetate  (DEPO-MEDROL) injection 80 mg    Allergies (verified) Codeine, Garlic, Other, Sertraline, and Methotrexate   History: Past Medical History:  Diagnosis Date   Acid reflux    Allergy    Anal fissure    Anxiety    Arthritis    Barrett's esophagus    Bipolar disorder (HCC)    Cervical herniated disc    Chronic headaches    Chronic pain    Depression    Dermatomyositis (HCC)    GERD (gastroesophageal reflux disease)    H/O vitamin D deficiency    Hypercholesterolemia    IBS (irritable bowel syndrome)    Ocular migraine    Osteoporosis    Past Surgical History:  Procedure Laterality Date   APPENDECTOMY     ESOPHAGOGASTRIC FUNDOPLICATION     GALLBLADDER SURGERY  2001   NERVE SURGERY Right 03/07/2023   [facility]- Right ARM   RETINAL DETACHMENT SURGERY Right 11/29/2021   TONSILLECTOMY  1981   toupetfundoplicatio  2000   TUBAL LIGATION     Family History  Problem Relation Age of Onset   Cancer Mother    Lung cancer Mother    COPD Father    Cancer Father        breast   Bipolar disorder Father    Anxiety disorder Father    Depression Father    Paranoid behavior Father    Heart disease Father    Stroke Maternal Grandmother    Hypertension Maternal Grandmother    Bipolar disorder Maternal Grandmother    Dementia Maternal Grandmother    Hypertension Maternal Grandfather    Stroke Paternal Grandmother    Hypertension Paternal Grandmother    Bipolar disorder Paternal Grandmother    Dementia Paternal Grandmother    Hypertension Paternal Grandfather    Stomach cancer Neg Hx    Colon cancer Neg Hx    Esophageal cancer Neg Hx    Rectal cancer Neg Hx    Social History   Socioeconomic History   Marital status: Married    Spouse name: Not on file   Number of children: 2   Years of education: Not on file   Highest education level: Bachelor's degree (e.g., BA, AB, BS)  Occupational History   Occupation: retired  Tobacco Use   Smoking status: Never    Passive  exposure: Past   Smokeless tobacco: Never  Vaping Use   Vaping status: Never Used  Substance and Sexual Activity   Alcohol use: Yes    Alcohol/week: 2.0 standard drinks of alcohol    Types: 2 Glasses of wine per week    Comment: occasional glass of wine   Drug use: Never   Sexual activity: Yes    Partners: Male    Birth control/protection: None  Other Topics Concern   Not on file  Social History Narrative   Lives with husband   Social Drivers of Health   Financial Resource Strain: Low Risk  (09/05/2023)  Overall Financial Resource Strain (CARDIA)    Difficulty of Paying Living Expenses: Not hard at all  Food Insecurity: No Food Insecurity (09/05/2023)   Hunger Vital Sign    Worried About Running Out of Food in the Last Year: Never true    Ran Out of Food in the Last Year: Never true  Transportation Needs: No Transportation Needs (09/05/2023)   PRAPARE - Administrator, Civil Service (Medical): No    Lack of Transportation (Non-Medical): No  Physical Activity: Sufficiently Active (09/05/2023)   Exercise Vital Sign    Days of Exercise per Week: 5 days    Minutes of Exercise per Session: 150+ min  Stress: No Stress Concern Present (09/05/2023)   [employer] of Occupational Health - Occupational Stress Questionnaire    Feeling of Stress : Only a little  Social Connections: Socially Integrated (09/05/2023)   Social Connection and Isolation Panel [NHANES]    Frequency of Communication with Friends and Family: More than three times a week    Frequency of Social Gatherings with Friends and Family: Once a week    Attends Religious Services: 1 to 4 times per year    Active Member of [employer] or Organizations: Yes    Attends Engineer, structural: More than 4 times per year    Marital Status: Married    Tobacco Counseling Counseling given: Not Answered    Clinical Intake:  Pre-visit preparation completed: Yes  Pain : 0-10 Pain Score: 6  Pain Type:  Chronic pain Pain Location: Generalized Pain Descriptors / Indicators: Aching Pain Onset: More than a month ago     BMI - recorded: 24.33 Nutritional Status: BMI of 19-24  Normal Nutritional Risks: None Diabetes: No  Lab Results  Component Value Date   HGBA1C 5.2 12/22/2021     How often do you need to have someone help you when you read instructions, pamphlets, or other written materials from your doctor or pharmacy?: 1 - Never  Interpreter Needed?: No  Information entered by :: Lanier Ensign, LPN   Activities of Daily Living     09/12/2023   10:19 AM  In your present state of health, do you have any difficulty performing the following activities:  Hearing? 0  Vision? 0  Difficulty concentrating or making decisions? 0  Walking or climbing stairs? 0  Dressing or bathing? 0  Doing errands, shopping? 0  Preparing Food and eating ? N  Using the Toilet? N  In the past six months, have you accidently leaked urine? N  Do you have problems with loss of bowel control? N  Managing your Medications? N  Managing your Finances? N  Housekeeping or managing your Housekeeping? N    Patient Care Team: Allward[place], Crist Infante, PA-C as PCP - General (Physician Assistant)  Indicate any recent Medical Services you may have received from other than [facility] providers in the past year (date may be approximate).     Assessment:   This is a routine wellness examination for [place].  Hearing/Vision screen Hearing Screening - Comments:: Pt denies any hearing issues  Vision Screening - Comments:: Pt follows up with [facility] for annual eye exams    Goals Addressed             This Visit's Progress    Patient Stated       Continue exercise regimen       Depression Screen     09/12/2023   10:23 AM 03/23/2023  11:38 AM 08/07/2022    1:40 PM 06/02/2022   12:39 PM 12/22/2021   11:20 AM 10/20/2021    9:41 AM 02/04/2018   10:58 AM  PHQ 2/9 Scores  PHQ - 2 Score 0 0 0 0 0  0   PHQ- 9 Score 0  0         Information is confidential and restricted. Go to Review Flowsheets to unlock data.    Fall Risk     09/12/2023   10:25 AM 03/23/2023   11:38 AM 08/07/2022    1:40 PM 06/02/2022   12:41 PM 05/29/2022    5:55 PM  Fall Risk   Falls in the past year? 0 0 0 0 0  Number falls in past yr: 0 0 0 0 0  Injury with Fall? 0 0 0 0 0  Risk for fall due to : No Fall Risks No Fall Risks No Fall Risks Impaired vision   Follow up Falls prevention discussed Falls evaluation completed Falls evaluation completed Falls prevention discussed     MEDICARE RISK AT HOME:  Medicare Risk at Home Any stairs in or around the home?: Yes If so, are there any without handrails?: No Home free of loose throw rugs in walkways, pet beds, electrical cords, etc?: Yes Adequate lighting in your home to reduce risk of falls?: Yes Life alert?: No Use of a cane, walker or w/c?: No Grab bars in the bathroom?: No Shower chair or bench in shower?: No Elevated toilet seat or a handicapped toilet?: No  TIMED UP AND GO:  Was the test performed?  No  Cognitive Function: 6CIT completed        09/12/2023   10:26 AM 06/02/2022   12:42 PM  6CIT Screen  What Year? 0 points 0 points  What month? 0 points 0 points  What time? 0 points 0 points  Count back from 20 0 points 0 points  Months in reverse 0 points 0 points  Repeat phrase 0 points 0 points  Total Score 0 points 0 points    Immunizations Immunization History  Administered Date(s) Administered   Fluad Quad(high Dose 65+) 04/03/2022   Fluad Trivalent(High Dose 65+) 03/23/2023   Influenza-Unspecified 04/03/2022   Moderna Covid-19 Fall Seasonal Vaccine 76yrs & older 04/03/2022, 09/29/2022, 03/23/2023   Moderna Covid-19 Vaccine Bivalent Booster 8yrs & up 04/19/2021, 04/03/2022   Moderna Sars-Covid-2 Vaccination 09/12/2019, 10/14/2019, 02/26/2020   PNEUMOCOCCAL CONJUGATE-20 04/03/2022   Tdap 01/06/2022   Zoster Recombinant(Shingrix)  09/29/2022, 01/26/2023    Screening Tests Health Maintenance  Topic Date Due   MAMMOGRAM  Never done   Hepatitis C Screening  03/22/2024 (Originally 08/10/1973)   COVID-19 Vaccine (8 - 2024-25 season) 09/20/2023   Medicare Annual Wellness (AWV)  09/11/2024   Colonoscopy  04/11/2026   DTaP/Tdap/Td (2 - Td or Tdap) 01/07/2032   Pneumonia Vaccine 14+ Years old  Completed   INFLUENZA VACCINE  Completed   DEXA SCAN  Completed   Zoster Vaccines- Shingrix  Completed   HPV VACCINES  Aged Out    Health Maintenance  Health Maintenance Due  Topic Date Due   MAMMOGRAM  Never done   Health Maintenance Items Addressed: See Nurse Notes  Additional Screening:  Vision Screening: Recommended annual ophthalmology exams for early detection of glaucoma and other disorders of the eye.  Dental Screening: Recommended annual dental exams for proper oral hygiene  Community Resource Referral / Chronic Care Management: CRR required this visit?  No   CCM required  this visit?  No     Plan:     I have personally reviewed and noted the following in the patient's chart:   Medical and social history Use of alcohol, tobacco or illicit drugs  Current medications and supplements including opioid prescriptions. Patient is currently taking opioid prescriptions. Information provided to patient regarding non-opioid alternatives. Patient advised to discuss non-opioid treatment plan with their provider. Functional ability and status Nutritional status Physical activity Advanced directives List of other physicians Hospitalizations, surgeries, and ER visits in previous 12 months Vitals Screenings to include cognitive, depression, and falls Referrals and appointments  In addition, I have reviewed and discussed with patient certain preventive protocols, quality metrics, and best practice recommendations. A written personalized care plan for preventive services as well as general preventive health  recommendations were provided to patient.     Marzella Schlein, LPN   08/24/6008   After Visit Summary: (MyChart) Due to this being a telephonic visit, the after visit summary with patients personalized plan was offered to patient via MyChart   Notes: Nothing significant to report at this time.

## 2023-10-03 ENCOUNTER — Ambulatory Visit (INDEPENDENT_AMBULATORY_CARE_PROVIDER_SITE_OTHER): Admitting: Obstetrics and Gynecology

## 2023-10-03 ENCOUNTER — Encounter: Payer: Self-pay | Admitting: Obstetrics and Gynecology

## 2023-10-03 ENCOUNTER — Other Ambulatory Visit (HOSPITAL_COMMUNITY)
Admission: RE | Admit: 2023-10-03 | Discharge: 2023-10-03 | Disposition: A | Discharge status: Home / Self Care | Source: Other Acute Inpatient Hospital | Attending: Obstetrics and Gynecology | Admitting: Obstetrics and Gynecology

## 2023-10-03 VITALS — BP 131/82 | HR 86

## 2023-10-03 DIAGNOSIS — R350 Frequency of micturition: Secondary | ICD-10-CM

## 2023-10-03 DIAGNOSIS — R319 Hematuria, unspecified: Secondary | ICD-10-CM | POA: Diagnosis present

## 2023-10-03 DIAGNOSIS — M25562 Pain in left knee: Secondary | ICD-10-CM | POA: Insufficient documentation

## 2023-10-03 LAB — POCT URINALYSIS DIPSTICK
Bilirubin, UA: NEGATIVE
Blood, UA: NEGATIVE
Glucose, UA: NEGATIVE
Ketones, UA: NEGATIVE
Leukocytes, UA: NEGATIVE
Nitrite, UA: NEGATIVE
Protein, UA: POSITIVE — AB
Spec Grav, UA: >=1.03 — AB (ref 1.010–1.025)
Urobilinogen, UA: 0.2 U/dL
pH, UA: 5.5 (ref 5.0–8.0)

## 2023-10-03 MED ORDER — NITROFURANTOIN MONOHYD MACRO 100 MG PO CAPS: 100.0000 mg | ORAL_CAPSULE | Freq: Every day | ORAL | 1 refills | Status: DC

## 2023-10-03 NOTE — Progress Notes (Signed)
 [facility] Return Visit  SUBJECTIVE  History of Present Illness: Tina Pena is a 68 y.o. female seen in follow-up for rUTI. Patient has not been taking hiprex and reports today she saw blood in her urine, has previously been on prophylaxis. Hx of antibiotic resistance UTI.     Past Medical History: Patient  has a past medical history of Acid reflux, Allergy, Anal fissure, Anxiety, Arthritis, Barrett's esophagus, Bipolar disorder (HCC), Cervical herniated disc, Chronic headaches, Chronic pain, Depression, Dermatomyositis (HCC), GERD (gastroesophageal reflux disease), H/O vitamin D deficiency, Hypercholesterolemia, IBS (irritable bowel syndrome), Ocular migraine, and Osteoporosis.   Past Surgical History: She  has a past surgical history that includes Appendectomy; Tonsillectomy (1981); Gallbladder surgery (2001); Esophagogastric fundoplication; toupetfundoplicatio (2000); Tubal ligation; Retinal detachment surgery (Right, 11/29/2021); and Nerve Surgery (Right, 03/07/2023).   Medications: She has a current medication list which includes the following prescription(s): atomoxetine, clonazepam, cymbalta, epinephrine, estradiol, hydroxychloroquine, hydroxyzine, infliximab, claritin-d 24 hour, multiple vitamins-minerals, nitrofurantoin (macrocrystal-monohydrate), pantoprazole, probiotic product, sucralfate, tramadol, ubrelvy, valacyclovir, and AMBULATORY NON FORMULARY MEDICATION, and the following Facility-Administered Medications: methylprednisolone acetate.   Allergies: Patient is allergic to codeine, garlic, other, sertraline, and methotrexate.   Social History: Patient  reports that she has never smoked. She has been exposed to tobacco smoke. She has never used smokeless tobacco. She reports current alcohol use of about 2.0 standard drinks of alcohol per week. She reports that she does not use drugs.     OBJECTIVE    Lab Results  Component Value Date   COLORU yellow  10/03/2023   CLARITYU clear 10/03/2023   GLUCOSEUR Negative 10/03/2023   BILIRUBINUR negative 10/03/2023   KETONESU negative 10/03/2023   SPECGRAV >=1.030 (A) 10/03/2023   RBCUR negative 10/03/2023   PHUR 5.5 10/03/2023   PROTEINUR Positive (A) 10/03/2023   UROBILINOGEN 0.2 10/03/2023   LEUKOCYTESUR Negative 10/03/2023    Physical Exam: Vitals:   10/03/23 1526  BP: 131/82  Pulse: 86   Gen: No apparent distress, A&O x 3.  Detailed Urogynecologic Evaluation:  External vulvar appears red with white patches. Patient has hx of lichens.  Posterior fourchette has a split spot that looks erythematous and at the same time dry. No other obvious signs of bleeding. No urethral caruncle, no mass, or bleeding noted.     ASSESSMENT AND PLAN    Ms. Thieme is a 68 y.o. with:  1. Urinary frequency   2. Hematuria, unspecified type    Cathed urine sample to be sent for culture to rule out UTI. Patient to re-start her Macrobid prophylaxis as this would be her 3rd breakthrough UTI. No blood noted on cath urine specimen. Patient does have some Lichen's that is irritated on the vulvar lips and has a spot at the posterior fourchette that looks irritated. Encouraged her to use her estrogen cream 2-3 times weekly and do clobetasol cream.   Patient to follow up in 6 months or sooner if needed.   Selmer Dominion, NP

## 2023-10-03 NOTE — Patient Instructions (Signed)
 We will send your urine for culture  Please start back on the prophylaxis.   Use the estrogen cream 2-3 times per week  Use the clobetasol cream on the external vaginal region where it was itchy  Please use a silicon base/blend lubrication for intercourse.   For the nights not using estrogen consider vitamin E, aloe vera, or coconut oil in the vagina for lubrication support.

## 2023-10-04 ENCOUNTER — Ambulatory Visit: Payer: Medicare Other | Admitting: Adult Health

## 2023-10-04 VITALS — BP 126/75 | HR 90

## 2023-10-04 DIAGNOSIS — G43701 Chronic migraine without aura, not intractable, with status migrainosus: Secondary | ICD-10-CM | POA: Diagnosis not present

## 2023-10-04 MED ORDER — ONABOTULINUMTOXINA 200 UNITS IJ SOLR
155.0000 [IU] | Freq: Once | INTRAMUSCULAR | Status: AC
  Administered 2023-10-04: 155 [IU] via INTRAMUSCULAR

## 2023-10-04 NOTE — Progress Notes (Signed)
 Update 10/04/2023 JM: Patient returns for repeat botox. Prior injection on 07/11/2023.  She reports great benefit with Botox injection.  She has only had 2 migraine headaches over the past 3 months and less severe.  She was switched to [place] which has been beneficial for migraine rescue.  Tolerated procedure well today.  Return in 3 months for repeat injection.        Consent Form Botulism Toxin Injection For Chronic Migraine    Reviewed orally with patient, additionally signature is on file:  Botulism toxin has been approved by the Federal drug administration for treatment of chronic migraine. Botulism toxin does not cure chronic migraine and it may not be effective in some patients.  The administration of botulism toxin is accomplished by injecting a small amount of toxin into the muscles of the neck and head. Dosage must be titrated for each individual. Any benefits resulting from botulism toxin tend to wear off after 3 months with a repeat injection required if benefit is to be maintained. Injections are usually done every 3-4 months with maximum effect peak achieved by about 2 or 3 weeks. Botulism toxin is expensive and you should be sure of what costs you will incur resulting from the injection.  The side effects of botulism toxin use for chronic migraine may include:   -Transient, and usually mild, facial weakness with facial injections  -Transient, and usually mild, head or neck weakness with head/neck injections  -Reduction or loss of forehead facial animation due to forehead muscle weakness  -Eyelid drooping  -Dry eye  -Pain at the site of injection or bruising at the site of injection  -Double vision  -Potential unknown long term risks   Contraindications: You should not have Botox if you are pregnant, nursing, allergic to albumin, have an infection, skin condition, or muscle weakness at the site of the injection, or have myasthenia gravis, Lambert-Eaton syndrome, or  ALS.  It is also possible that as with any injection, there may be an allergic reaction or no effect from the medication. Reduced effectiveness after repeated injections is sometimes seen and rarely infection at the injection site may occur. All care will be taken to prevent these side effects. If therapy is given over a long time, atrophy and wasting in the muscle injected may occur. Occasionally the patient's become refractory to treatment because they develop antibodies to the toxin. In this event, therapy needs to be modified.  I have read the above information and consent to the administration of botulism toxin.    BOTOX PROCEDURE NOTE FOR MIGRAINE HEADACHE  Contraindications and precautions discussed with patient(above). Aseptic procedure was observed and patient tolerated procedure. Procedure performed by Ihor Austin, AGNP-BC.   The condition has existed for more than 6 months, and pt does not have a diagnosis of ALS, Myasthenia Gravis or Lambert-Eaton Syndrome.  Risks and benefits of injections discussed and pt agrees to proceed with the procedure.  Written consent obtained  These injections are medically necessary. Pt  receives good benefits from these injections. These injections do not cause sedations or hallucinations which the oral therapies may cause.   Description of procedure:  The patient was placed in a sitting position. The standard protocol was used for Botox as follows, with 5 units of Botox injected at each site:  -Procerus muscle, midline injection  -Corrugator muscle, bilateral injection  -Frontalis muscle, bilateral injection, with 2 sites each side, medial injection was performed in the upper one third of the  frontalis muscle, in the region vertical from the medial inferior edge of the superior orbital rim. The lateral injection was again in the upper one third of the forehead vertically above the lateral limbus of the cornea, 1.5 cm lateral to the medial  injection site.  -Temporalis muscle injection, 4 sites, bilaterally. The first injection was 3 cm above the tragus of the ear, second injection site was 1.5 cm to 3 cm up from the first injection site in line with the tragus of the ear. The third injection site was 1.5-3 cm forward between the first 2 injection sites. The fourth injection site was 1.5 cm posterior to the second injection site. 5th site laterally in the temporalis  muscleat the level of the outer canthus.  -Occipitalis muscle injection, 3 sites, bilaterally. The first injection was done one half way between the occipital protuberance and the tip of the mastoid process behind the ear. The second injection site was done lateral and superior to the first, 1 fingerbreadth from the first injection. The third injection site was 1 fingerbreadth superiorly and medially from the first injection site.  -Cervical paraspinal muscle injection, 2 sites, bilaterally. The first injection site was 1 cm from the midline of the cervical spine, 3 cm inferior to the lower border of the occipital protuberance. The second injection site was 1.5 cm superiorly and laterally to the first injection site.  -Trapezius muscle injection was performed at 3 sites, bilaterally. The first injection site was in the upper trapezius muscle halfway between the inflection point of the neck, and the acromion. The second injection site was one half way between the acromion and the first injection site. The third injection was done between the first injection site and the inflection point of the neck.    A total of 200 units of Botox was prepared, 155 units of Botox was injected as documented above, any Botox not injected was wasted. The patient tolerated the procedure well, there were no complications of the above procedure.   Ihor Austin, AGNP-BC  [facility] [address] [place], [place] [address]  Phone [phone] Fax  [phone] Note: This document was prepared with digital dictation and possible smart phrase technology. Any transcriptional errors that result from this process are unintentional.

## 2023-10-04 NOTE — Progress Notes (Signed)
 Botox- 200 units x 1 vial Lot: [id number] Expiration: 09/2025 NDC: 1610-9604-54   Bacteriostatic 0.9% Sodium Chloride- * mL  Lot: [id number] Expiration: 04/26/2024 NDC: 1478-2956-21   Dx: G43.109 BB Witnessed [id number]

## 2023-10-05 ENCOUNTER — Encounter: Payer: Self-pay | Admitting: Internal Medicine

## 2023-10-05 ENCOUNTER — Ambulatory Visit (INDEPENDENT_AMBULATORY_CARE_PROVIDER_SITE_OTHER): Payer: Medicare Other | Admitting: Internal Medicine

## 2023-10-05 VITALS — BP 118/70 | HR 81 | Ht 68.0 in | Wt 163.0 lb

## 2023-10-05 DIAGNOSIS — K5900 Constipation, unspecified: Secondary | ICD-10-CM

## 2023-10-05 DIAGNOSIS — K219 Gastro-esophageal reflux disease without esophagitis: Secondary | ICD-10-CM | POA: Diagnosis not present

## 2023-10-05 DIAGNOSIS — Z860100 Personal history of colon polyps, unspecified: Secondary | ICD-10-CM

## 2023-10-05 DIAGNOSIS — K5909 Other constipation: Secondary | ICD-10-CM

## 2023-10-05 LAB — URINE CULTURE

## 2023-10-05 MED ORDER — LUBIPROSTONE 24 MCG PO CAPS: 24.0000 ug | ORAL_CAPSULE | Freq: Two times a day (BID) | ORAL | 3 refills | Status: DC

## 2023-10-05 NOTE — Progress Notes (Signed)
 10/05/2023 Tina Pena [id number] 1955/07/08   HISTORY OF PRESENT ILLNESS: This is a 68 year old female with history of GERD, bipolar disorder, arthritis, and osteoporosis presents for follow up of acid reflux, chronic constipation, and anal fissure  Interval History: She has been taking her Amitiza 8 mcg BID, which is not working well for keeping her constipation under control. She is having one BM once every 4 days. She drinks plenty of water and walks 2-8 miles per day. She eats plenty of fibrous foods. Endorses ab pain that is in the lower abdomen worse on the right. Endorses occasional bloating. Ab pain does not improve after having a stool. Ab pain did not improve after her recent colonoscopy. Anal fissure is better. Acid reflux is okay on medications.   Past Medical History:  Diagnosis Date   Acid reflux    Allergy    Anal fissure    Anxiety    Arthritis    Barrett's esophagus    Bipolar disorder (HCC)    Cervical herniated disc    Chronic headaches    Chronic pain    Depression    Dermatomyositis (HCC)    GERD (gastroesophageal reflux disease)    H/O vitamin D deficiency    Hypercholesterolemia    IBS (irritable bowel syndrome)    Ocular migraine    Osteoporosis    Past Surgical History:  Procedure Laterality Date   APPENDECTOMY     ESOPHAGOGASTRIC FUNDOPLICATION     GALLBLADDER SURGERY  2001   NERVE SURGERY Right 03/07/2023   [facility]- Right ARM   RETINAL DETACHMENT SURGERY Right 11/29/2021   TONSILLECTOMY  1981   toupetfundoplicatio  2000   TUBAL LIGATION      reports that she has never smoked. She has been exposed to tobacco smoke. She has never used smokeless tobacco. She reports current alcohol use of about 2.0 standard drinks of alcohol per week. She reports that she does not use drugs. family history includes Anxiety disorder in her father; Bipolar disorder in her father, maternal grandmother, and paternal grandmother; COPD in her father;  Cancer in her father and mother; Dementia in her maternal grandmother and paternal grandmother; Depression in her father; Heart disease in her father; Hypertension in her maternal grandfather, maternal grandmother, paternal grandfather, and paternal grandmother; Lung cancer in her mother; Paranoid behavior in her father; Stroke in her maternal grandmother and paternal grandmother. Allergies  Allergen Reactions   Codeine Nausea And Vomiting    Other reaction(s): extreme stomach upset, GI Intolerance   Garlic Nausea And Vomiting   Other Nausea And Vomiting    Onions    Sertraline Rash    Other reaction(s): Confusion Other reaction(s): confusion, Other (See Comments) Other reaction(s): Confusion    Methotrexate     Other reaction(s): elevated LFT's Other reaction(s): elevated LFT's       Outpatient Encounter Medications as of 10/05/2023  Medication Sig   atomoxetine (STRATTERA) 40 MG capsule Take 40 mg by mouth every morning.   clonazePAM (KLONOPIN) 1 MG tablet Take 1 mg by mouth at bedtime as needed.   CYMBALTA 60 MG capsule 2 capsules Orally Once a day   EPINEPHrine 0.3 mg/0.3 mL IJ SOAJ injection Inject 0.3 mg into the muscle as needed.   estradiol (ESTRACE) 0.1 MG/GM vaginal cream Place 0.5 g vaginally 2 (two) times a week. Place 0.5g nightly for two weeks then twice a week after   hydroxychloroquine (PLAQUENIL) 200 MG tablet Take 200 mg by mouth  2 (two) times daily.   hydrOXYzine (ATARAX) 25 MG tablet TAKE 1 TABLET BY MOUTH DAILY AS NEEDED.   inFLIXimab (REMICADE) 100 MG injection 7mg /kg   loratadine-pseudoephedrine (CLARITIN-D 24 HOUR) 10-240 MG 24 hr tablet 1 tablet as needed Orally Once a day for 30 day(s)   Multiple Vitamins-Minerals (MULTI ADULT GUMMIES PO) Take by mouth.   nitrofurantoin, macrocrystal-monohydrate, (MACROBID) 100 MG capsule Take 1 capsule (100 mg total) by mouth daily.   pantoprazole (PROTONIX) 40 MG tablet TAKE 1 TABLET BY MOUTH EVERY DAY   Probiotic Product  (PROBIOTIC BLEND PO) Take by mouth. Uqora Prebiotic   sucralfate (CARAFATE) 1 g tablet Take 1 tablet (1 g total) by mouth 2 (two) times daily.   traMADol (ULTRAM) 50 MG tablet Take 1 tablet (50 mg total) by mouth every 8 (eight) hours as needed.   Ubrogepant (UBRELVY) 100 MG TABS Take 1 tablet (100 mg total) by mouth as needed (Take 1 tab at onset of migraine, may repeat additional tablet in 2 hours if needed (do not exceed more than 2 tab in 24 hrs)).   valACYclovir (VALTREX) 1000 MG tablet Take by mouth as needed.   AMBULATORY NON FORMULARY MEDICATION Medication Name: Diltiazem 2% cream w/Lidocaine 5 % cream - Using your index finger, apply a small amount of medication inside the rectum up to your first knuckle/joint three times daily x 6-8 weeks.   Facility-Administered Encounter Medications as of 10/05/2023  Medication   methylPREDNISolone acetate (DEPO-MEDROL) injection 80 mg    PHYSICAL EXAM: BP 118/70   Pulse 81   Ht 5\' 8"  (1.727 m)   Wt 163 lb (73.9 kg)   BMI 24.78 kg/m  General: Well developed white female in no acute distress Head: Normocephalic and atraumatic Eyes:  Sclerae anicteric, conjunctiva pink. Ears: Normal auditory acuity Lungs: Clear throughout to auscultation; no W/R/R. Heart: Regular rate and rhythm; no M/R/G. Abdomen: Soft, non-distended.  Quiet bowel sounds. Mildly tender in the lower abdomen Rectal: No external hemorrhoids noted. Internal hemorrhoids palpate.d Musculoskeletal: Symmetrical with no gross deformities  Skin: No lesions on visible extremities Extremities: No edema  Neurological: Alert oriented x 4, grossly non-focal Psychological:  Alert and cooperative. Normal mood and affect  Labs 05/2023: CBC nml. CMP nml. ESR nml. Alpha gal panel negative.  Colonoscopy 04/12/23: - The examined portion of the ileum was normal. - Diverticulosis in the entire examined colon. - One 12 mm polyp in the ascending colon, removed with a cold snare. Resected and  retrieved. - Non- bleeding hemorrhoids Path: 1. Surgical [P], colon, ascending, polyp (1) :       - TUBULAR ADENOMA.       - NO HIGH GRADE DYSPLASIA OR MALIGNANCY.   ASSESSMENT AND PLAN: History of colon polyps Constipation Anal fissure - resolved GERD Amitiza does not seem to working well enough at the lower dosage. Thus will go ahead and increase her Amitiza to the higher dose to see if this helps with her constipation. Could consider pelvic floor PT in the future. Previously Movantik did not seem to work, MiraLAX causes her a lot of bloating, Linzess worked too well for her and caused her diarrhea. Her previous anal fissure has now resolved - Increase Amitiza 24 mcg BID - Could consider pelvic floor PT in the future - Continue Gas-X PRN - Cont pantoprazole 40 mg every day. Sucralfate PRN - Next colonoscopy due in 03/2026 for polyp surveillance - RTC 3 months  I spent 30 minutes of time, including in  depth chart review, independent review of results as outlined above, communicating results with the patient directly, face-to-face time with the patient, coordinating care, and ordering studies and medications as appropriate, and documentation.

## 2023-10-05 NOTE — Patient Instructions (Signed)
 We have sent the following medications to your pharmacy for you to pick up at your convenience: Amitiza    If your blood pressure at your visit was 140/90 or greater, please contact your primary care physician to follow up on this.  _______________________________________________________  If you are age 68 or older, your body mass index should be between 23-30. Your Body mass index is 24.78 kg/m. If this is out of the aforementioned range listed, please consider follow up with your Primary Care Provider.  If you are age 61 or younger, your body mass index should be between 19-25. Your Body mass index is 24.78 kg/m. If this is out of the aformentioned range listed, please consider follow up with your Primary Care Provider.   ________________________________________________________  The [facility] providers would like to encourage you to use [facility] to communicate with providers for non-urgent requests or questions.  Due to long hold times on the telephone, sending your provider a message by [facility] may be a faster and more efficient way to get a response.  Please allow 48 business hours for a response.  Please remember that this is for non-urgent requests.  _______________________________________________________  Thank you for entrusting me with your care and for choosing [facility],  Dr. Eulah Pont

## 2023-10-07 ENCOUNTER — Other Ambulatory Visit: Payer: Self-pay | Admitting: Obstetrics and Gynecology

## 2023-10-07 DIAGNOSIS — N390 Urinary tract infection, site not specified: Secondary | ICD-10-CM

## 2023-10-09 ENCOUNTER — Telehealth: Payer: Self-pay

## 2023-10-09 ENCOUNTER — Encounter: Payer: Self-pay | Admitting: Physician Assistant

## 2023-10-09 NOTE — Telephone Encounter (Signed)
 Copied from CRM [id number]. Topic: General - Other >> Oct 08, 2023  1:35 PM Adonis Hoot wrote: Reason for CRM: [place] going [place] and she's prescribed tramadol,and because it is a controlled substance she would need a letter stating that. She stated that having it placed on her mychart would be fine.  Please see pt message requesting letter to be sent to her MyChart and advise.

## 2023-10-09 NOTE — Telephone Encounter (Signed)
 Letter completed and sent via MyChart per patient request and PCP approval

## 2023-10-11 ENCOUNTER — Encounter: Payer: Self-pay | Admitting: Obstetrics and Gynecology

## 2023-10-12 ENCOUNTER — Other Ambulatory Visit: Payer: Self-pay | Admitting: Physician Assistant

## 2023-10-12 DIAGNOSIS — G8929 Other chronic pain: Secondary | ICD-10-CM

## 2023-10-15 ENCOUNTER — Ambulatory Visit: Payer: Medicare Other | Admitting: Adult Health

## 2023-10-16 ENCOUNTER — Other Ambulatory Visit: Payer: Self-pay | Admitting: Physician Assistant

## 2023-10-16 DIAGNOSIS — G8929 Other chronic pain: Secondary | ICD-10-CM

## 2023-10-16 MED ORDER — TRAMADOL HCL 50 MG PO TABS: 50.0000 mg | ORAL_TABLET | Freq: Three times a day (TID) | ORAL | 0 refills | Status: DC | PRN

## 2023-10-16 NOTE — Telephone Encounter (Signed)
 Last OV: 09/07/23  Next OV: 01/07/24  Last Filled: 09/07/23  Quantity: 90

## 2023-10-16 NOTE — Telephone Encounter (Signed)
 Copied from CRM [id number]. Topic: Clinical - Medication Refill >> Oct 16, 2023 10:39 AM Earnestine Goes B wrote: Most Recent Primary Care Visit:  Provider: Bruno Capri  Department: [facility]  Visit Type: ANNUAL WELL VISIT, SEQUENTIAL  Date: 09/12/2023  Medication: traMADol  (ULTRAM ) 50 MG tablet  Has the patient contacted their pharmacy? Yes (Agent: If no, request that the patient contact the pharmacy for the refill. If patient does not wish to contact the pharmacy document the reason why and proceed with request.) (Agent: If yes, when and what did the pharmacy advise?)  Is this the correct pharmacy for this prescription? Yes If no, delete pharmacy and type the correct one.  This is the patient's preferred pharmacy:  [id number] - [place], [place] - [address] [place]  [address] Phone: [phone] Fax: [phone]   Has the prescription been filled recently? Yes  Is the patient out of the medication? Yes  Has the patient been seen for an appointment in the last year OR does the patient have an upcoming appointment? Yes  Can we respond through MyChart? Yes  Agent: Please be advised that Rx refills may take up to 3 business days. We ask that you follow-up with your pharmacy.

## 2023-11-21 ENCOUNTER — Other Ambulatory Visit: Payer: Self-pay | Admitting: Physician Assistant

## 2023-11-21 DIAGNOSIS — G8929 Other chronic pain: Secondary | ICD-10-CM

## 2023-11-22 NOTE — Telephone Encounter (Signed)
 Last OV: 09/07/23  Next OV: 01/07/24  Last Filled: 10/16/23  Quantity: 90

## 2023-11-29 ENCOUNTER — Other Ambulatory Visit: Payer: Self-pay

## 2023-11-29 ENCOUNTER — Telehealth: Payer: Self-pay

## 2023-11-29 DIAGNOSIS — L989 Disorder of the skin and subcutaneous tissue, unspecified: Secondary | ICD-10-CM

## 2023-11-29 NOTE — Telephone Encounter (Signed)
 Copied from CRM [id number]. Topic: Referral - Request for Referral >> Nov 29, 2023  2:40 PM Marlan Silva wrote: Did the patient discuss referral with their provider in the last year? Yes (If No - schedule appointment) (If Yes - send message)  Appointment offered? No  Type of order/referral and detailed reason for visit: Patient disucssed prior with provider and was informed to call back with Podiatrist.  Preference of office, provider, location: Dr. Alva Jewels with [facility]  If referral order, have you been seen by this specialty before? Yes (If Yes, this issue or another issue? When? Where?  Can we respond through MyChart? Yes  Patient is requesting a call back [phone]   Returned pt call and advised referral being placed to [facility] and showing noted in last OV with PCP; Not showing a Dr Alva Jewels in Epic or [employer] for podiatry so advised that any provider will be fine. Advised pt to allow 7-10 business days to be called to schedule initial appt. Pt verbalized understanding.

## 2023-11-30 ENCOUNTER — Other Ambulatory Visit: Payer: Self-pay | Admitting: Physician Assistant

## 2023-11-30 DIAGNOSIS — K2970 Gastritis, unspecified, without bleeding: Secondary | ICD-10-CM

## 2023-12-06 ENCOUNTER — Ambulatory Visit: Admitting: Podiatry

## 2023-12-13 ENCOUNTER — Ambulatory Visit: Admitting: Podiatry

## 2023-12-31 ENCOUNTER — Other Ambulatory Visit: Payer: Self-pay | Admitting: Physician Assistant

## 2023-12-31 DIAGNOSIS — G8929 Other chronic pain: Secondary | ICD-10-CM

## 2023-12-31 NOTE — Telephone Encounter (Signed)
 Pt requesting refill for Tramadol .   Last OV: 09/07/2023  Next OV: 01/07/2024

## 2024-01-03 ENCOUNTER — Ambulatory Visit (INDEPENDENT_AMBULATORY_CARE_PROVIDER_SITE_OTHER): Admitting: Adult Health

## 2024-01-03 VITALS — BP 123/69 | HR 83

## 2024-01-03 DIAGNOSIS — G43701 Chronic migraine without aura, not intractable, with status migrainosus: Secondary | ICD-10-CM

## 2024-01-03 DIAGNOSIS — G43109 Migraine with aura, not intractable, without status migrainosus: Secondary | ICD-10-CM

## 2024-01-03 MED ORDER — ONABOTULINUMTOXINA 200 UNITS IJ SOLR
155.0000 [IU] | Freq: Once | INTRAMUSCULAR | Status: AC
  Administered 2024-01-03: 155 [IU] via INTRAMUSCULAR

## 2024-01-03 NOTE — Progress Notes (Signed)
 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

## 2024-01-03 NOTE — Progress Notes (Signed)
 Update 01/03/2024 JM: Patient returns for repeat botox . Prior injection on 10/04/2023.  She reports great benefit with Botox  injection.  Migraines remain very well-controlled.  She has only had a few milder headaches which would resolve after use of Ubrelvy  and is not progressing to a migraine headache. Tolerated procedure well today.  Return in 3 months for repeat injection.        Consent Form Botulism Toxin Injection For Chronic Migraine    Reviewed orally with patient, additionally signature is on file:  Botulism toxin has been approved by the Federal drug administration for treatment of chronic migraine. Botulism toxin does not cure chronic migraine and it may not be effective in some patients.  The administration of botulism toxin is accomplished by injecting a small amount of toxin into the muscles of the neck and head. Dosage must be titrated for each individual. Any benefits resulting from botulism toxin tend to wear off after 3 months with a repeat injection required if benefit is to be maintained. Injections are usually done every 3-4 months with maximum effect peak achieved by about 2 or 3 weeks. Botulism toxin is expensive and you should be sure of what costs you will incur resulting from the injection.  The side effects of botulism toxin use for chronic migraine may include:   -Transient, and usually mild, facial weakness with facial injections  -Transient, and usually mild, head or neck weakness with head/neck injections  -Reduction or loss of forehead facial animation due to forehead muscle weakness  -Eyelid drooping  -Dry eye  -Pain at the site of injection or bruising at the site of injection  -Double vision  -Potential unknown long term risks   Contraindications: You should not have Botox  if you are pregnant, nursing, allergic to albumin, have an infection, skin condition, or muscle weakness at the site of the injection, or have myasthenia gravis, Lambert-Eaton  syndrome, or ALS.  It is also possible that as with any injection, there may be an allergic reaction or no effect from the medication. Reduced effectiveness after repeated injections is sometimes seen and rarely infection at the injection site may occur. All care will be taken to prevent these side effects. If therapy is given over a long time, atrophy and wasting in the muscle injected may occur. Occasionally the patient's become refractory to treatment because they develop antibodies to the toxin. In this event, therapy needs to be modified.  I have read the above information and consent to the administration of botulism toxin.    BOTOX  PROCEDURE NOTE FOR MIGRAINE HEADACHE  Contraindications and precautions discussed with patient(above). Aseptic procedure was observed and patient tolerated procedure. Procedure performed by Harlene Bogaert, AGNP-BC.   The condition has existed for more than 6 months, and pt does not have a diagnosis of ALS, Myasthenia Gravis or Lambert-Eaton Syndrome.  Risks and benefits of injections discussed and pt agrees to proceed with the procedure.  Written consent obtained  These injections are medically necessary. Pt  receives good benefits from these injections. These injections do not cause sedations or hallucinations which the oral therapies may cause.   Description of procedure:  The patient was placed in a sitting position. The standard protocol was used for Botox  as follows, with 5 units of Botox  injected at each site:  -Procerus muscle, midline injection  -Corrugator muscle, bilateral injection  -Frontalis muscle, bilateral injection, with 2 sites each side, medial injection was performed in the upper one third of the frontalis  muscle, in the region vertical from the medial inferior edge of the superior orbital rim. The lateral injection was again in the upper one third of the forehead vertically above the lateral limbus of the cornea, 1.5 cm lateral to the  medial injection site.  -Temporalis muscle injection, 4 sites, bilaterally. The first injection was 3 cm above the tragus of the ear, second injection site was 1.5 cm to 3 cm up from the first injection site in line with the tragus of the ear. The third injection site was 1.5-3 cm forward between the first 2 injection sites. The fourth injection site was 1.5 cm posterior to the second injection site. 5th site laterally in the temporalis  muscleat the level of the outer canthus.  -Occipitalis muscle injection, 3 sites, bilaterally. The first injection was done one half way between the occipital protuberance and the tip of the mastoid process behind the ear. The second injection site was done lateral and superior to the first, 1 fingerbreadth from the first injection. The third injection site was 1 fingerbreadth superiorly and medially from the first injection site.  -Cervical paraspinal muscle injection, 2 sites, bilaterally. The first injection site was 1 cm from the midline of the cervical spine, 3 cm inferior to the lower border of the occipital protuberance. The second injection site was 1.5 cm superiorly and laterally to the first injection site.  -Trapezius muscle injection was performed at 3 sites, bilaterally. The first injection site was in the upper trapezius muscle halfway between the inflection point of the neck, and the acromion. The second injection site was one half way between the acromion and the first injection site. The third injection was done between the first injection site and the inflection point of the neck.    A total of 200 units of Botox  was prepared, 155 units of Botox  was injected as documented above, any Botox  not injected was wasted. The patient tolerated the procedure well, there were no complications of the above procedure.   Harlene Bogaert, AGNP-BC  [facility] [address] [place], [place] [address]  Phone [phone] Fax  [phone] Note: This document was prepared with digital dictation and possible smart phrase technology. Any transcriptional errors that result from this process are unintentional.

## 2024-01-07 ENCOUNTER — Ambulatory Visit: Admitting: Physician Assistant

## 2024-01-07 ENCOUNTER — Ambulatory Visit: Admitting: Internal Medicine

## 2024-01-08 ENCOUNTER — Ambulatory Visit (INDEPENDENT_AMBULATORY_CARE_PROVIDER_SITE_OTHER): Admitting: Podiatry

## 2024-01-08 ENCOUNTER — Encounter: Payer: Self-pay | Admitting: Podiatry

## 2024-01-08 DIAGNOSIS — L209 Atopic dermatitis, unspecified: Secondary | ICD-10-CM | POA: Insufficient documentation

## 2024-01-08 DIAGNOSIS — R748 Abnormal levels of other serum enzymes: Secondary | ICD-10-CM | POA: Insufficient documentation

## 2024-01-08 DIAGNOSIS — M19049 Primary osteoarthritis, unspecified hand: Secondary | ICD-10-CM | POA: Insufficient documentation

## 2024-01-08 DIAGNOSIS — M1991 Primary osteoarthritis, unspecified site: Secondary | ICD-10-CM | POA: Insufficient documentation

## 2024-01-08 DIAGNOSIS — I781 Nevus, non-neoplastic: Secondary | ICD-10-CM | POA: Insufficient documentation

## 2024-01-08 DIAGNOSIS — D239 Other benign neoplasm of skin, unspecified: Secondary | ICD-10-CM | POA: Diagnosis not present

## 2024-01-08 DIAGNOSIS — M3313 Other dermatomyositis without myopathy: Secondary | ICD-10-CM | POA: Insufficient documentation

## 2024-01-08 DIAGNOSIS — Z79899 Other long term (current) drug therapy: Secondary | ICD-10-CM | POA: Insufficient documentation

## 2024-01-08 DIAGNOSIS — R5382 Chronic fatigue, unspecified: Secondary | ICD-10-CM | POA: Insufficient documentation

## 2024-01-08 DIAGNOSIS — L820 Inflamed seborrheic keratosis: Secondary | ICD-10-CM | POA: Insufficient documentation

## 2024-01-08 MED ORDER — DEXAMETHASONE SODIUM PHOSPHATE 120 MG/30ML IJ SOLN
2.0000 mg | Freq: Once | INTRAMUSCULAR | Status: AC
  Administered 2024-01-08: 2 mg via INTRA_ARTICULAR

## 2024-01-08 NOTE — Progress Notes (Signed)
 Subjective:  Patient ID: Tina Pena, female    DOB: April 18, 1956,  MRN: [id number] HPI Chief Complaint  Patient presents with   Foot Pain    Posterior heel left - cyst like knot x 1 year, changes in size periodically, tender when pressed, PCP eval-referred here   New Patient (Initial Visit)    Est pt 10/2020    68 y.o. female presents with the above complaint.   ROS: Denies fever chills nausea muscle aches pains calf pain back pain chest pain shortness of breath.  Past Medical History:  Diagnosis Date   Acid reflux    Allergy     Anal fissure    Anxiety    Arthritis    Barrett's esophagus    Bipolar disorder (HCC)    Cervical herniated disc    Chronic headaches    Chronic pain    Depression    Dermatomyositis (HCC)    GERD (gastroesophageal reflux disease)    H/O vitamin D deficiency    Hypercholesterolemia    IBS (irritable bowel syndrome)    Ocular migraine    Osteoporosis    Past Surgical History:  Procedure Laterality Date   APPENDECTOMY     ESOPHAGOGASTRIC FUNDOPLICATION     GALLBLADDER SURGERY  2001   NERVE SURGERY Right 03/07/2023   [facility]- Right ARM   RETINAL DETACHMENT SURGERY Right 11/29/2021   TONSILLECTOMY  1981   toupetfundoplicatio  2000   TUBAL LIGATION      Current Outpatient Medications:    loteprednol (LOTEMAX) 0.5 % ophthalmic suspension, INSTILL 1 DROP INTO LEFT EYE FOUR TIMES A DAY AS DIRECTED QID OS X 1 WK, BID OS X 1 WK, Disp: , Rfl:    atomoxetine (STRATTERA) 40 MG capsule, Take 40 mg by mouth every morning., Disp: , Rfl:    clonazePAM  (KLONOPIN ) 1 MG tablet, Take 1 mg by mouth at bedtime as needed., Disp: , Rfl:    CYMBALTA  60 MG capsule, 2 capsules Orally Once a day, Disp: , Rfl:    EPINEPHrine  0.3 mg/0.3 mL IJ SOAJ injection, Inject 0.3 mg into the muscle as needed., Disp: 2 each, Rfl: 1   estradiol  (ESTRACE ) 0.1 MG/GM vaginal cream, PLACE 0.5G NIGHTLY FOR TWO WEEKS THEN TWICE A WEEK AFTER, Disp: 42.5 g, Rfl: 11    hydroxychloroquine (PLAQUENIL) 200 MG tablet, Take 200 mg by mouth 2 (two) times daily., Disp: , Rfl:    hydrOXYzine  (ATARAX ) 25 MG tablet, TAKE 1 TABLET BY MOUTH DAILY AS NEEDED., Disp: 90 tablet, Rfl: 1   inFLIXimab (REMICADE) 100 MG injection, 7mg /kg, Disp: , Rfl:    loratadine-pseudoephedrine (CLARITIN-D 24 HOUR) 10-240 MG 24 hr tablet, 1 tablet as needed Orally Once a day for 30 day(s), Disp: , Rfl:    Multiple Vitamins-Minerals (MULTI ADULT GUMMIES PO), Take by mouth., Disp: , Rfl:    pantoprazole  (PROTONIX ) 40 MG tablet, TAKE 1 TABLET BY MOUTH EVERY DAY, Disp: 90 tablet, Rfl: 1   Probiotic Product (PROBIOTIC BLEND PO), Take by mouth. Uqora Prebiotic, Disp: , Rfl:    sucralfate  (CARAFATE ) 1 g tablet, Take 1 tablet (1 g total) by mouth 2 (two) times daily., Disp: 180 tablet, Rfl: 1   traMADol  (ULTRAM ) 50 MG tablet, TAKE 1 TABLET BY MOUTH EVERY 8 HOURS AS NEEDED, Disp: 90 tablet, Rfl: 0   Ubrogepant  (UBRELVY ) 100 MG TABS, Take 1 tablet (100 mg total) by mouth as needed (Take 1 tab at onset of migraine, may repeat additional tablet in 2 hours if needed (  do not exceed more than 2 tab in 24 hrs))., Disp: 10 tablet, Rfl: 5   valACYclovir (VALTREX) 1000 MG tablet, Take by mouth as needed., Disp: , Rfl:   Current Facility-Administered Medications:    methylPREDNISolone  acetate (DEPO-MEDROL ) injection 80 mg, 80 mg, Intramuscular, Once,   Allergies  Allergen Reactions   Codeine Nausea And Vomiting    Other reaction(s): extreme stomach upset, GI Intolerance   Garlic Nausea And Vomiting   Other Nausea And Vomiting    Onions    Sertraline Rash    Other reaction(s): Confusion Other reaction(s): confusion, Other (See Comments) Other reaction(s): Confusion    Methotrexate     Other reaction(s): elevated LFT's Other reaction(s): elevated LFT's    Review of Systems Objective:  There were no vitals filed for this visit.  General: Well developed, nourished, in no acute distress, alert and  oriented x3   Dermatological: Skin is warm, dry and supple bilateral. Nails x 10 are well maintained; remaining integument appears unremarkable at this time. There are no open sores, no preulcerative lesions, no rash or signs of infection present.  Small neurovascular lesion posterior medial aspect of the left heel.  Blanches clear and turns purple with pressure from surrounding tissue.  Measures less than 4 mm in diameter  Vascular: Dorsalis Pedis artery and Posterior Tibial artery pedal pulses are 2/4 bilateral with immedate capillary fill time. Pedal hair growth present. No varicosities and no lower extremity edema present bilateral.   Neruologic: Grossly intact via light touch bilateral. Vibratory intact via tuning fork bilateral. Protective threshold with Semmes Wienstein monofilament intact to all pedal sites bilateral. Patellar and Achilles deep tendon reflexes 2+ bilateral. No Babinski or clonus noted bilateral.   Musculoskeletal: No gross boney pedal deformities bilateral. No pain, crepitus, or limitation noted with foot and ankle range of motion bilateral. Muscular strength 5/5 in all groups tested bilateral.  Gait: Unassisted, Nonantalgic.    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     Tina Pena [facility]. [place], [place]

## 2024-01-22 DIAGNOSIS — S62397A Other fracture of fifth metacarpal bone, left hand, initial encounter for closed fracture: Secondary | ICD-10-CM | POA: Insufficient documentation

## 2024-01-22 DIAGNOSIS — S62307A Unspecified fracture of fifth metacarpal bone, left hand, initial encounter for closed fracture: Secondary | ICD-10-CM | POA: Insufficient documentation

## 2024-02-03 ENCOUNTER — Other Ambulatory Visit: Payer: Self-pay | Admitting: Physician Assistant

## 2024-02-03 DIAGNOSIS — G8929 Other chronic pain: Secondary | ICD-10-CM

## 2024-02-04 NOTE — Telephone Encounter (Signed)
 Last Visit: 09/07/23  Next Visit: 09/17/24  Last Filled: 01/01/24  Quantity: 90

## 2024-02-11 ENCOUNTER — Encounter: Payer: Self-pay | Admitting: Physician Assistant

## 2024-02-11 ENCOUNTER — Ambulatory Visit (INDEPENDENT_AMBULATORY_CARE_PROVIDER_SITE_OTHER): Admitting: Physician Assistant

## 2024-02-11 VITALS — BP 136/78 | HR 85 | Temp 97.3°F | Ht 68.0 in | Wt 158.0 lb

## 2024-02-11 DIAGNOSIS — M818 Other osteoporosis without current pathological fracture: Secondary | ICD-10-CM

## 2024-02-11 DIAGNOSIS — Z9189 Other specified personal risk factors, not elsewhere classified: Secondary | ICD-10-CM

## 2024-02-11 DIAGNOSIS — M542 Cervicalgia: Secondary | ICD-10-CM | POA: Diagnosis not present

## 2024-02-11 DIAGNOSIS — R233 Spontaneous ecchymoses: Secondary | ICD-10-CM | POA: Diagnosis not present

## 2024-02-11 DIAGNOSIS — R296 Repeated falls: Secondary | ICD-10-CM

## 2024-02-11 DIAGNOSIS — G43701 Chronic migraine without aura, not intractable, with status migrainosus: Secondary | ICD-10-CM | POA: Diagnosis not present

## 2024-02-11 DIAGNOSIS — G8929 Other chronic pain: Secondary | ICD-10-CM

## 2024-02-11 DIAGNOSIS — H547 Unspecified visual loss: Secondary | ICD-10-CM | POA: Diagnosis not present

## 2024-02-11 DIAGNOSIS — M3313 Other dermatomyositis without myopathy: Secondary | ICD-10-CM

## 2024-02-11 LAB — CBC WITH DIFFERENTIAL/PLATELET
Basophils Absolute: 0.1 K/uL (ref 0.0–0.1)
Basophils Relative: 1.4 % (ref 0.0–3.0)
Eosinophils Absolute: 0.2 K/uL (ref 0.0–0.7)
Eosinophils Relative: 4.7 % (ref 0.0–5.0)
HCT: 39.2 % (ref 36.0–46.0)
Hemoglobin: 12.8 g/dL (ref 12.0–15.0)
Lymphocytes Relative: 18.6 % (ref 12.0–46.0)
Lymphs Abs: 1 K/uL (ref 0.7–4.0)
MCHC: 32.6 g/dL (ref 30.0–36.0)
MCV: 86.3 fl (ref 78.0–100.0)
Monocytes Absolute: 0.3 K/uL (ref 0.1–1.0)
Monocytes Relative: 6.6 % (ref 3.0–12.0)
Neutro Abs: 3.5 K/uL (ref 1.4–7.7)
Neutrophils Relative %: 68.7 % (ref 43.0–77.0)
Platelets: 276 K/uL (ref 150.0–400.0)
RBC: 4.55 Mil/uL (ref 3.87–5.11)
RDW: 13.5 % (ref 11.5–15.5)
WBC: 5.2 K/uL (ref 4.0–10.5)

## 2024-02-11 LAB — COMPREHENSIVE METABOLIC PANEL WITH GFR
ALT: 30 U/L (ref 0–35)
AST: 29 U/L (ref 0–37)
Albumin: 4.1 g/dL (ref 3.5–5.2)
Alkaline Phosphatase: 67 U/L (ref 39–117)
BUN: 20 mg/dL (ref 6–23)
CO2: 31 meq/L (ref 19–32)
Calcium: 8.7 mg/dL (ref 8.4–10.5)
Chloride: 101 meq/L (ref 96–112)
Creatinine, Ser: 0.74 mg/dL (ref 0.40–1.20)
GFR: 83.08 mL/min (ref >60.00)
Glucose, Bld: 73 mg/dL (ref 70–99)
Potassium: 4.1 meq/L (ref 3.5–5.1)
Sodium: 140 meq/L (ref 135–145)
Total Bilirubin: 0.3 mg/dL (ref 0.2–1.2)
Total Protein: 6.9 g/dL (ref 6.0–8.3)

## 2024-02-11 LAB — VITAMIN D 25 HYDROXY (VIT D DEFICIENCY, FRACTURES): VITD: 28.97 ng/mL — ABNORMAL LOW (ref 30.00–100.00)

## 2024-02-11 LAB — VITAMIN B12: Vitamin B-12: 521 pg/mL (ref 211–911)

## 2024-02-11 NOTE — Patient Instructions (Signed)
  VISIT SUMMARY: During your visit, we discussed your frequent falls, which are likely due to your vision issues from retinal detachment and cataracts. We also reviewed your chronic conditions, including dermatomyositis, cervical disc herniation, and migraines, and planned further evaluations and treatments.  YOUR PLAN: RECURRENT FALLS: You have been experiencing frequent falls, likely due to your vision issues and possibly medication side effects. -Stop taking tramadol  temporarily to see if it affects your balance. -We may refer you to a neurologist to check for balance issues. -Consider getting a brain MRI to look at your cerebellum and balance center.  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  DERMATOMYOSITIS: This chronic condition affects your healing process, but you have noticed some improvement recently. -We will do blood tests to check for vitamin deficiencies and your platelet count.  CERVICAL DISC HERNIATION WITH PAIN AND LIMITED MOVEMENT: You have chronic neck pain and limited movement due to herniated discs. -We may refer you to a neurosurgeon to explore non-surgical treatments like injections. -We will discuss if you need updated imaging.  MIGRAINE WITH CHRONIC HEADACHE: You have chronic migraines that have improved with Botox  treatments. -Continue with your Botox  treatments for migraines. -We will talk to your neurologist about possibly getting imaging for reassurance.  EASY BRUISING AND SLOW WOUND HEALING: You have noticed easy bruising and slow healing, which might be related to your medications or other conditions. -We will do blood tests to check your platelet count and clotting factors.  POLYPHARMACY: You are taking multiple medications, which might be causing side effects like dizziness and falls. -We will refer  you to a clinical pharmacist to review all your medications and check for interactions.                      Contains text generated by Abridge.                                 Contains text generated by Abridge.

## 2024-02-11 NOTE — Progress Notes (Signed)
 Patient ID: Tina Pena, female    DOB: [birth year], 68 y.o.   MRN: [id number]   Assessment & Plan:  Recurrent falls -     AMB Referral [facility]I Care Management -     CBC with Differential/Platelet -     PT and PTT -     Comprehensive metabolic panel with GFR -     VITAMIN D  25 Hydroxy (Vit-D Deficiency, Fractures) -     Vitamin B12  Neck pain, chronic  Easy bruising -     CBC with Differential/Platelet -     PT and PTT -     Comprehensive metabolic panel with GFR -     VITAMIN D  25 Hydroxy (Vit-D Deficiency, Fractures) -     Vitamin B12  Chronic migraine without aura with status migrainosus, not intractable  Visual impairment -     CBC with Differential/Platelet -     PT and PTT -     Comprehensive metabolic panel with GFR -     VITAMIN D  25 Hydroxy (Vit-D Deficiency, Fractures) -     Vitamin B12  Dermatomyositis (HCC)  At risk for polypharmacy -     AMB Referral VBCI Care Management  Age-related osteoporosis without fracture -     VITAMIN D  25 Hydroxy (Vit-D Deficiency, Fractures) -     Vitamin B12      Assessment and Plan Assessment & Plan Recurrent falls Recurrent falls over the past four months, attributed to visual impairment and potential balance issues. No consistent dizziness or chest pain reported. Previous falls resulted in minor injuries such as knee and hand impacts. No major fractures despite falls, indicating potentially adequate bone density. Differential includes visual impairment and medication side effects. 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  Dermatomyositis Chronic condition with improved healing noted. Concerns about prolonged healing times and potential vitamin deficiencies. 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 Previous suggestion of surgery declined by her. No recent imaging performed. - Consider referral to neurosurgeon for evaluation and potential non-surgical interventions such as injections - Discuss updating imaging if needed  Migraine with chronic headache Chronic migraines with reduced frequency due to Botox  treatment every three months. Migraines started in 2014, likely related to cervical disc issues. - Continue Botox  treatment for migraines - Discuss with neurologist about potential imaging for reassurance  Easy bruising and slow wound healing Reports of easy bruising and slow wound healing, possibly related to medication side effects or underlying conditions. Previous low platelet count noted but resolved. - Order blood work to assess platelet count and clotting factors  Polypharmacy Concerns about potential medication interactions and side effects contributing to symptoms such as dizziness and falls. Current medications include Plaquenil, Ubrelvy , Remicade, Strattera, Klonopin , and occasional steroids. - Refer to clinical pharmacist for comprehensive medication review to assess for polypharmacy and interactions      Return in about 8 weeks (around 04/07/2024) for recheck/follow-up.    Subjective:    Chief Complaint  Patient  presents with   Bleeding/Bruising    Pt in office for concerns with bruising and bleeding easily; pt states also due for 3 mon follow up as well for refills; pt has bruising over BIL UE and also BIL LE; if bumping a door handle or anything is bruising; wondering if this is a  vitamin deficiency etc    HPI Discussed the use of AI scribe software for clinical note transcription with the patient, who gave verbal consent to proceed.  History of Present Illness Tina Pena is a 68 year old female with retinal detachment and cataracts who presents with frequent falls.  She experiences frequent falls, which she attributes to her eyesight issues rather than balance problems. She has a history of retinal detachment in 2023, resulting in loss of depth perception, and currently has cataracts. In June, she missed a step at [employer], resulting in a fall on her knees. She also fell on the stairs at home two weeks ago and on a greenway, where she broke a finger. Despite these falls, she did not experience any falls during a recent long mile hike in [place] and [place].  She has dermatomyositis, which she believes affects her healing process, as injuries take a long time to heal. However, she feels she is healing better recently. She is concerned about potential vitamin deficiencies or excesses and mentions a past incident of low platelet count, which later normalized.  Her current medications include Plaquenil, Ubrelvy  for headaches, Remicade infusions, Strattera for ADHD, and Klonopin  for anxiety, which she uses sparingly. She also uses Botox  for migraines, which has reduced the frequency and severity of her headaches. She uses tramadol , ibuprofen, and ice for pain management, noting that Excedrin causes ear ringing.  She has a history of migraines since 2014, managed with Botox  for the past two years. She experiences migraines approximately once every week or two. She also has herniated discs in her neck, which are becoming more compressed, contributing to pain and limited movement.  No recent chest pain, dizziness, or syncope. She recalls an incident two years ago where she felt faint due to dehydration. Occasional headaches but no consistent dizziness or balance issues prior to  the recent falls. No bleeding from gums, stool, or urine.     Past Medical History:  Diagnosis Date   Acid reflux    Allergy     Anal fissure    Anxiety    Arthritis    Barrett's esophagus    Bipolar disorder (HCC)    Cervical herniated disc    Chronic headaches    Chronic pain    Depression    Dermatomyositis (HCC)    GERD (gastroesophageal reflux disease)    H/O vitamin D  deficiency    Hypercholesterolemia    IBS (irritable bowel syndrome)    Ocular migraine    Osteoporosis     Past Surgical History:  Procedure Laterality Date   APPENDECTOMY     ESOPHAGOGASTRIC FUNDOPLICATION     GALLBLADDER SURGERY  2001   NERVE SURGERY Right 03/07/2023   [facility]- Right ARM   RETINAL DETACHMENT SURGERY Right 11/29/2021   TONSILLECTOMY  1981   toupetfundoplicatio  2000   TUBAL LIGATION      Family History  Problem Relation Age of Onset   Cancer Mother    Lung cancer Mother    COPD Father    Cancer Father        breast   Bipolar disorder Father    Anxiety disorder Father  Depression Father    Paranoid behavior Father    Heart disease Father    Stroke Maternal Grandmother    Hypertension Maternal Grandmother    Bipolar disorder Maternal Grandmother    Dementia Maternal Grandmother    Hypertension Maternal Grandfather    Stroke Paternal Grandmother    Hypertension Paternal Grandmother    Bipolar disorder Paternal Grandmother    Dementia Paternal Grandmother    Hypertension Paternal Grandfather    Stomach cancer Neg Hx    Colon cancer Neg Hx    Esophageal cancer Neg Hx    Rectal cancer Neg Hx     Social History   Tobacco Use   Smoking status: Never    Passive exposure: Past   Smokeless tobacco: Never  Vaping Use   Vaping status: Never Used  Substance Use Topics   Alcohol use: Yes    Alcohol/week: 2.0 standard drinks of alcohol    Types: 2 Glasses of wine per week    Comment: occasional glass of wine   Drug use: Never     Allergies  Allergen  Reactions   Codeine Nausea And Vomiting    Other reaction(s): extreme stomach upset, GI Intolerance   Garlic Nausea And Vomiting   Other Nausea And Vomiting    Onions    Sertraline Rash and Other (See Comments)    Other reaction(s): Confusion  Other reaction(s): confusion, Other (See Comments)    Review of Systems NEGATIVE UNLESS OTHERWISE INDICATED IN HPI      Objective:     BP 136/78 (BP Location: Left Arm, Patient Position: Sitting, Cuff Size: Normal)   Pulse 85   Temp (!) 97.3 F (36.3 C) (Temporal)   Ht 5' 8 (1.727 m)   Wt 158 lb (71.7 kg)   SpO2 98%   BMI 24.02 kg/m   Wt Readings from Last 3 Encounters:  02/11/24 158 lb (71.7 kg)  10/05/23 163 lb (73.9 kg)  09/12/23 160 lb (72.6 kg)    BP Readings from Last 3 Encounters:  02/11/24 136/78  01/03/24 123/69  10/05/23 118/70     Physical Exam Vitals and nursing note reviewed.  Constitutional:      Appearance: Normal appearance.  Eyes:     Extraocular Movements: Extraocular movements intact.     Conjunctiva/sclera: Conjunctivae normal.     Pupils: Pupils are equal, round, and reactive to light.  Cardiovascular:     Rate and Rhythm: Normal rate and regular rhythm.     Pulses: Normal pulses.     Heart sounds: Normal heart sounds. No murmur heard. Pulmonary:     Effort: Pulmonary effort is normal.     Breath sounds: Normal breath sounds.  Skin:    Findings: Bruising and rash present.  Neurological:     General: No focal deficit present.     Mental Status: She is alert and oriented to person, place, and time.     Cranial Nerves: No cranial nerve deficit.     Sensory: No sensory deficit.     Motor: No weakness.     Coordination: Romberg sign negative. Coordination normal.     Gait: Gait normal.  Psychiatric:        Mood and Affect: Mood normal.             Siddarth Hsiung M Trayven Lumadue, PA-C

## 2024-02-12 ENCOUNTER — Telehealth: Payer: Self-pay

## 2024-02-12 ENCOUNTER — Ambulatory Visit: Payer: Self-pay | Admitting: Physician Assistant

## 2024-02-12 LAB — PT AND PTT
INR: 0.9 (ref 0.9–1.2)
Prothrombin Time: 9.8 s (ref 9.1–12.0)
aPTT: 25 s (ref 24–33)

## 2024-02-12 NOTE — Telephone Encounter (Signed)
 Copied from CRM #[id number]. Topic: Clinical - Medication Question >> Feb 11, 2024  3:12 PM Tina Pena wrote: Reason for CRM: Patient would like to know if a medication Lunesta can be called in to help with sleep. Pharmacy confirmed.  Please see pt request for refill on Lunesta. Not showing on patient current med list. Please advise

## 2024-02-13 ENCOUNTER — Telehealth: Payer: Self-pay | Admitting: Adult Health

## 2024-02-13 NOTE — Telephone Encounter (Signed)
 Pt called to inform that Ubrogepant  (UBRELVY ) 100 MG TABS  is not working for her  . Pt headaches are not going away at all . Pt wanted to know if there is anything else she can take .

## 2024-02-14 NOTE — Telephone Encounter (Signed)
 Please call for additional information.  Recently saw for Botox  injection and reported migraines very well-controlled with only a few milder headaches and would take Ubrelvy  without progression to migraine headache.  Have migraines worsened over the past month where she is needing Ubrelvy  more frequently or are migraines still stable but just seems Ubrelvy  is not as effective as it was?

## 2024-02-21 ENCOUNTER — Ambulatory Visit (INDEPENDENT_AMBULATORY_CARE_PROVIDER_SITE_OTHER): Admitting: Podiatry

## 2024-02-21 ENCOUNTER — Encounter: Payer: Self-pay | Admitting: Podiatry

## 2024-02-21 DIAGNOSIS — D492 Neoplasm of unspecified behavior of bone, soft tissue, and skin: Secondary | ICD-10-CM | POA: Diagnosis not present

## 2024-02-21 NOTE — Progress Notes (Signed)
 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  She states that he did well for a little while after the injection but has come back.  She states it is severely painful particularly shoes and while hiking.  Objective: I reviewed her past medical history medications allergies surgery social history review of systems.  She has a purpleish blanching soft tissue mass measuring less than a centimeter in diameter.  Exquisitely painful posterior left heel.  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  Plan: I feel the best thing to do would be to take this out at the surgery center particularly since it is a radiating pain that she feels per palpation.  So we consented her today for excision soft tissue tumor posterior aspect of her left heel.  She understands possible complications which may include but not limited to postop pain bleeding swelling infection recurrence need further surgery overcorrection under correction loss of sensation.

## 2024-02-27 ENCOUNTER — Telehealth: Payer: Self-pay

## 2024-02-27 NOTE — Progress Notes (Unsigned)
 Care Guide Pharmacy Note  02/27/2024 Name: Tina Pena MRN: [id number] DOB: 12-20-1955  Referred By: Kathrene Mardy HERO, PA-C Reason for referral: Complex Care Management (Outreach to schedule with Pharm d )   Tina Pena is a 68 y.o. year old female who is a primary care patient of Allwardt, Alyssa M, PA-C.  Bo Rogue was referred to the pharmacist for assistance related to: polypharmacy   An unsuccessful telephone outreach was attempted today to contact the patient who was referred to the pharmacy team for assistance with medication management. Additional attempts will be made to contact the patient.  Jeoffrey Buffalo , RMA     [facility]  [facility], [facility] Guide  Direct Dial: [phone]  Website: [email or web address]

## 2024-02-28 ENCOUNTER — Encounter: Payer: Self-pay | Admitting: Pharmacist

## 2024-02-28 ENCOUNTER — Other Ambulatory Visit: Payer: Self-pay | Admitting: Pharmacist

## 2024-02-28 DIAGNOSIS — M818 Other osteoporosis without current pathological fracture: Secondary | ICD-10-CM

## 2024-02-28 DIAGNOSIS — R296 Repeated falls: Secondary | ICD-10-CM

## 2024-02-28 DIAGNOSIS — M19049 Primary osteoarthritis, unspecified hand: Secondary | ICD-10-CM | POA: Insufficient documentation

## 2024-02-28 NOTE — Progress Notes (Signed)
 02/28/2024 Name: Tina Pena MRN: [id number] DOB: [birth year]  Chief Complaint  Patient presents with   Medication Management    Tina Pena is a 68 y.o. year old female who presented for a telephone visit.   They were referred to the pharmacist by their PCP for assistance in managing complex medication management.    Subjective: Care Team: Primary Care Provider: Allwardt, Tina HERO, PA-C ; Next Scheduled Visit: 04/11/2024 Neurologist: Tina Bogaert, NP; Next Scheduled Visit: 03/05/2024  Medication Access/Adherence  Current Pharmacy:  [id number] - [address], [place] - [address] [place]  [address] Phone: [phone] Fax: [phone]   Patient reports affordability concerns with their medications: No  Though I noticed that she previously was taking Nurtec with good results but has to stop because it in not on her [facility] formulary. Patient is currently taking Ubrelvy  and Botox  injections.  Patient reports access/transportation concerns to their pharmacy: No  Patient reports adherence concerns with their medications:  No     PCP noted patient has fallen 3 times in last 3 months. She asked Clinical Pharmacist to review medication list to identify medications that could increase fall risk.   [name] in June 2025 - tripped over a root while walking on the [place]. Fractured finger. Second fall was in July 2025. 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 Last fall was in August 2025 at home - missed step coming from garage.   Patient feels that falls are related to her eyesight. She had detached retina in 2023 with surgery but was left with decreased vision in her right eye - she is legally blind in right eye but glasses corrects some.   PCP recommended patient stop tramadol  02/11/2024.  Other medications with high fall risk on her medication list:  Clonazepam  - takes as needed for sleep -  pt reports she take sparingly Cymbalta  60mg  twice a day Hydroxyzine  25mg  - takes as needed for itching Loratadine+pseudoephedrine - takes daily as preventative for headaches / sinus issues.  Benadryl  - recommended by rheumatologist to take for itching.   Patient has osteoporosis per her problem list.  She has tried alendronate in past but states she stopped due to worsening GERD.  Currently taking calcium + D 500mg  / 1000units - take 2 tabs per day.  PCP mentioned rechecking DEXA. Patient would like to have DEXA rechecked. She would consider Prolia if indicated.  Walks daily for exercise.   Chronic Conditions that increase risk of osteoporosis / falls - rheumatoid arthritis, osteoarthritis, chronic pain, cervical herniated disc., Taking PPI for Barrett's esophagus / GERD   Objective:  Lab Results  Component Value Date   HGBA1C 5.2 12/22/2021    Lab Results  Component Value Date   CREATININE 0.74 02/11/2024   BUN 20 02/11/2024   NA 140 02/11/2024   K 4.1 02/11/2024   CL 101 02/11/2024   CO2 31 02/11/2024    Lab Results  Component Value Date   CHOL 196 03/23/2023   HDL 72.40 03/23/2023   LDLCALC 96 03/23/2023   TRIG 142.0 03/23/2023   CHOLHDL 3 03/23/2023    Medications Reviewed Today     Reviewed by Tina Pena, RPH-CPP (Pharmacist) on 02/28/24 at 1512  Med List Status: <None>   Medication Order Taking? Sig Documenting Provider Last Dose Status Informant  aspirin-acetaminophen-caffeine (EXCEDRIN MIGRAINE) 250-250-65 MG tablet [id number] Yes Take 1 tablet by mouth every 6 (six) hours as  needed for headache. [provider]  Active   atomoxetine (STRATTERA) 40 MG capsule [id number]  Take 40 mg by mouth every morning.  Patient not taking: Reported on 02/28/2024   [provider]  Active   Calcium Carb-Cholecalciferol (CALCIUM 500 + D PO) [id number] Yes Take 500 mg by mouth 2 (two) times daily. [provider]  Active   clobetasol ointment  (TEMOVATE) 0.05 % [id number] Yes Apply topically. [provider]  Active   clonazePAM  (KLONOPIN ) 1 MG tablet [id number] Yes Take 1 mg by mouth at bedtime as needed. [provider]  Active   CYMBALTA  60 MG capsule [id number] Yes 2 capsules Orally Once a day  Patient taking differently: 60 mg 2 (two) times daily.   [provider]  Active   diphenhydrAMINE  (BENADRYL ) 25 mg capsule [id number] Yes Take 25-50 mg by mouth every 6 (six) hours as needed for itching. [provider]  Active   EPINEPHrine  0.3 mg/0.3 mL IJ SOAJ injection [id number] Yes Inject 0.3 mg into the muscle as needed. Tina Reusing, FNP  Active   estradiol  (ESTRACE ) 0.1 MG/GM vaginal cream [id number] Yes PLACE 0.5G NIGHTLY FOR TWO WEEKS THEN TWICE A WEEK AFTER Tina Rosaline SAILOR, MD  Active   hydroxychloroquine (PLAQUENIL) 200 MG tablet [id number] Yes Take 200 mg by mouth 2 (two) times daily. [provider]  Active   hydrOXYzine  (ATARAX ) 25 MG tablet [id number] Yes TAKE 1 TABLET BY MOUTH DAILY AS NEEDED.  Patient taking differently: Take 25 mg by mouth daily as needed for itching. Usually takes at night   Pena, Tina M, PA-C  Active   inFLIXimab (REMICADE) 100 MG injection [id number] Yes Infused every 6 weeks [provider]  Active   loratadine-pseudoephedrine (CLARITIN-D 24 HOUR) 10-240 MG 24 hr tablet [id number] Yes 1 tablet as needed Orally Once a day for 30 day(s) [provider]  Active   methylPREDNISolone  acetate (DEPO-MEDROL ) injection 80 mg [id number]   Tina Krabbe, NP  Active   Multiple Vitamins-Minerals (MULTI ADULT GUMMIES PO) [id number] Yes Take by mouth. [provider]  Active   nitrofurantoin  (MACRODANTIN ) 100 MG capsule [id number] Yes Take 100 mg by mouth daily. [provider]  Active   pantoprazole  (PROTONIX ) 40 MG tablet [id number] Yes TAKE 1 TABLET BY MOUTH EVERY DAY Pena, Tina M, PA-C  Active   Probiotic Product  (PROBIOTIC BLEND PO) [id number] Yes Take 1 tablet by mouth daily. Verlon Prebiotic [provider]  Active   sucralfate  (CARAFATE ) 1 g tablet [id number] Yes Take 1 tablet (1 g total) by mouth 2 (two) times daily.  Patient taking differently: Take 1 g by mouth 2 (two) times daily. Taking PRN   Pena, Tina M, PA-C  Active   traMADol  (ULTRAM ) 50 MG tablet [id number] Yes Take 1 tablet (50 mg total) by mouth every 8 (eight) hours as needed. Pena, Tina HERO, PA-C  Active   Ubrogepant  (UBRELVY ) 100 MG TABS [id number] Yes Take 1 tablet (100 mg total) by mouth as needed (Take 1 tab at onset of migraine, may repeat additional tablet in 2 hours if needed (do not exceed more than 2 tab in 24 hrs)). Whitfield Raisin, NP  Active   valACYclovir (VALTREX) 1000 MG tablet [id number] Yes Take by mouth as needed. [provider]  Active   vitamin C (ASCORBIC ACID) 250 MG tablet [id number] Yes Take 250 mg by mouth daily. [provider]  Active  Assessment/Plan:   Osteoporosis:Currently not taking pharmacotherapy - was intolerant to alendronate. Due to have DEXA rechecked - Reviewed recommendation for daily calcium intake of 1200 mg and vitamin D  intake of [phone] units from combination of diet and supplements - Continue to walk daily for exercise - Patient is open to rechecking DEXA - will send request to PCP.  Patient would consider Prolia.   Medication Management / Increased falls.  - Discussed that hydroxyzine , Benadryl  and Claritin D are all antihistamine type medications. They can affect balance and cognition. Patient to decrease use of hydroxyzine . - Continue to not take tramadol . - Discussed not taking hydroxyzine  and clonazepam  together and using both sparingly.  - Consider physical therapy for help with depth perception / balance assessment / fall prevention.  - Patient to look for a 2026 Medicare plan that has coverage for Nurtec since it worked well in  past.     Madelin Ray, PharmD Clinical Pharmacist [facility] Primary Care  Population Health [phone]

## 2024-02-28 NOTE — Progress Notes (Signed)
 Care Guide Pharmacy Note  02/28/2024 Name: Tina Pena MRN: [id number] DOB: 04/11/1956  Referred By: Kathrene Mardy HERO, PA-C Reason for referral: Complex Care Management (Outreach to schedule with Pharm d )   Tina Pena is a 68 y.o. year old female who is a primary care patient of Allwardt, Alyssa M, PA-C.  Tina Pena was referred to the pharmacist for assistance related to: polypharmacy   Successful contact was made with the patient to discuss pharmacy services including being ready for the pharmacist to call at least 5 minutes before the scheduled appointment time and to have medication bottles and any blood pressure readings ready for review. The patient agreed to meet with the pharmacist via telephone visit on (date/time).02/28/2024  Jeoffrey Buffalo , RMA     [place]  [facility], [facility] Guide  Direct Dial: [phone]  Website: [email or web address]

## 2024-03-04 ENCOUNTER — Telehealth: Payer: Self-pay | Admitting: Adult Health

## 2024-03-04 NOTE — Telephone Encounter (Signed)
 Patient reschedule appointment 10/7 at 7:45 am

## 2024-03-04 NOTE — Telephone Encounter (Signed)
 LVM and sent mychart msg informing pt of need to reschedule 03/05/24 appt - NP out   If patient calls back to reschedule please schedule appointment at least 10 days after Botox  appointment on 03/31/24

## 2024-03-05 ENCOUNTER — Ambulatory Visit: Admitting: Adult Health

## 2024-03-06 NOTE — Telephone Encounter (Signed)
 Was informed by manager office visit needed to be 10 days from Botox  appointment. Contacted patient to reschedule. Patient stated, can cancel the 10/7 will see her at Botox  appointment.

## 2024-03-07 ENCOUNTER — Telehealth: Payer: Self-pay | Admitting: Podiatry

## 2024-03-07 NOTE — Telephone Encounter (Signed)
 Received surgical consent forms  Left message for pt to call me back to get scheduled for surgery with Dr Verta

## 2024-03-31 ENCOUNTER — Ambulatory Visit: Admitting: Adult Health

## 2024-03-31 ENCOUNTER — Other Ambulatory Visit: Payer: Self-pay | Admitting: Adult Health

## 2024-03-31 VITALS — BP 126/77 | HR 89

## 2024-03-31 DIAGNOSIS — G43701 Chronic migraine without aura, not intractable, with status migrainosus: Secondary | ICD-10-CM

## 2024-03-31 DIAGNOSIS — G43109 Migraine with aura, not intractable, without status migrainosus: Secondary | ICD-10-CM

## 2024-03-31 MED ORDER — NURTEC 75 MG PO TBDP: 75.0000 mg | ORAL_TABLET | ORAL | 11 refills | Status: AC | PRN

## 2024-03-31 MED ORDER — ONABOTULINUMTOXINA 200 UNITS IJ SOLR
155.0000 [IU] | Freq: Once | INTRAMUSCULAR | Status: AC
  Administered 2024-03-31: 155 [IU] via INTRAMUSCULAR

## 2024-03-31 NOTE — Progress Notes (Signed)
 Botox  200u x1 vial Lot: [id number] Expiration: 04/2026 NDC: 9976-6078-97   Bacteriostatic 0.9% Sodium Chloride - * mL  Lot: [id number] Expiration: 04-25-2025 NDC: 9590-8033-97   Dx: G43.701 BB Witnessed by Heather RN

## 2024-03-31 NOTE — Progress Notes (Signed)
 Update 03/31/2024 JM: Patient returns for repeat botox . Prior injection on 05/02/2024.  She reports great benefit with Botox  injection with >50% migraine reduction.  Migraines remain very well-controlled.  She has only had a few milder headaches which used to resolve after Ubrelvy  but has not noticed as great of benefit with Ubrelvy  over the best couple of months. She questions retrying Therapist, occupational for [employer]	[place]. Order will be placed. Tolerated procedure well today.  Return in 3 months for repeat injection.  Prior Therapies                                  Imitrex Maxalt 10 mg PRN - lack of efficacy Zomig (pill and dissolvable tablet) Naratriptan  Nurtec - helped, not covered by insurance Ubrelvy  Reglan  Gabapentin  300 mg QHS - side effects Topamax - side effects  Vilazodone 40 mg daily Propranolol   - side effects Aimovig - contraindicated due to constipation  botox       Consent Form Botulism Toxin Injection For Chronic Migraine    Reviewed orally with patient, additionally signature is on file:  Botulism toxin has been approved by the Federal drug administration for treatment of chronic migraine. Botulism toxin does not cure chronic migraine and it may not be effective in some patients.  The administration of botulism toxin is accomplished by injecting a small amount of toxin into the muscles of the neck and head. Dosage must be titrated for each individual. Any benefits resulting from botulism toxin tend to wear off after 3 months with a repeat injection required if benefit is to be maintained. Injections are usually done every 3-4 months with maximum effect peak achieved by about 2 or 3 weeks. Botulism toxin is expensive and you should be sure of what costs you will incur resulting from the injection.  The side effects of botulism toxin use for chronic migraine may include:   -Transient, and usually mild, facial weakness with facial injections  -Transient, and usually  mild, head or neck weakness with head/neck injections  -Reduction or loss of forehead facial animation due to forehead muscle weakness  -Eyelid drooping  -Dry eye  -Pain at the site of injection or bruising at the site of injection  -Double vision  -Potential unknown long term risks   Contraindications: You should not have Botox  if you are pregnant, nursing, allergic to albumin, have an infection, skin condition, or muscle weakness at the site of the injection, or have myasthenia gravis, Lambert-Eaton syndrome, or ALS.  It is also possible that as with any injection, there may be an allergic reaction or no effect from the medication. Reduced effectiveness after repeated injections is sometimes seen and rarely infection at the injection site may occur. All care will be taken to prevent these side effects. If therapy is given over a long time, atrophy and wasting in the muscle injected may occur. Occasionally the patient's become refractory to treatment because they develop antibodies to the toxin. In this event, therapy needs to be modified.  I have read the above information and consent to the administration of botulism toxin.    BOTOX  PROCEDURE NOTE FOR MIGRAINE HEADACHE  Contraindications and precautions discussed with patient(above). Aseptic procedure was observed and patient tolerated procedure. Procedure performed by Harlene Bogaert, AGNP-BC.   The condition has existed for more than 6 months, and pt does not have a diagnosis of ALS, Myasthenia Gravis or Lambert-Eaton Syndrome.  Risks  and benefits of injections discussed and pt agrees to proceed with the procedure.  Written consent obtained  These injections are medically necessary. Pt  receives good benefits from these injections. These injections do not cause sedations or hallucinations which the oral therapies may cause.   Description of procedure:  The patient was placed in a sitting position. The standard protocol was used for  Botox  as follows, with 5 units of Botox  injected at each site:  -Procerus muscle, midline injection  -Corrugator muscle, bilateral injection  -Frontalis muscle, bilateral injection, with 2 sites each side, medial injection was performed in the upper one third of the frontalis muscle, in the region vertical from the medial inferior edge of the superior orbital rim. The lateral injection was again in the upper one third of the forehead vertically above the lateral limbus of the cornea, 1.5 cm lateral to the medial injection site.  -Temporalis muscle injection, 4 sites, bilaterally. The first injection was 3 cm above the tragus of the ear, second injection site was 1.5 cm to 3 cm up from the first injection site in line with the tragus of the ear. The third injection site was 1.5-3 cm forward between the first 2 injection sites. The fourth injection site was 1.5 cm posterior to the second injection site. 5th site laterally in the temporalis  muscleat the level of the outer canthus.  -Occipitalis muscle injection, 3 sites, bilaterally. The first injection was done one half way between the occipital protuberance and the tip of the mastoid process behind the ear. The second injection site was done lateral and superior to the first, 1 fingerbreadth from the first injection. The third injection site was 1 fingerbreadth superiorly and medially from the first injection site.  -Cervical paraspinal muscle injection, 2 sites, bilaterally. The first injection site was 1 cm from the midline of the cervical spine, 3 cm inferior to the lower border of the occipital protuberance. The second injection site was 1.5 cm superiorly and laterally to the first injection site.  -Trapezius muscle injection was performed at 3 sites, bilaterally. The first injection site was in the upper trapezius muscle halfway between the inflection point of the neck, and the acromion. The second injection site was one half way between the  acromion and the first injection site. The third injection was done between the first injection site and the inflection point of the neck.    A total of 200 units of Botox  was prepared, 155 units of Botox  was injected as documented above, any Botox  not injected was wasted. The patient tolerated the procedure well, there were no complications of the above procedure.   Harlene Bogaert, AGNP-BC  [facility] [address] [place], [place] [address]  Phone [phone] Fax [phone] Note: This document was prepared with digital dictation and possible smart phrase technology. Any transcriptional errors that result from this process are unintentional.

## 2024-04-01 ENCOUNTER — Ambulatory Visit: Admitting: Adult Health

## 2024-04-01 ENCOUNTER — Telehealth: Payer: Self-pay

## 2024-04-01 ENCOUNTER — Other Ambulatory Visit (HOSPITAL_COMMUNITY): Payer: Self-pay

## 2024-04-01 NOTE — Telephone Encounter (Signed)
 Please initiate PA

## 2024-04-01 NOTE — Telephone Encounter (Signed)
 Pharmacy Patient Advocate Encounter  Received notification from [employer] that Prior Authorization for NUrtec has been APPROVED from 04/01/2024 to 06/25/2025. Ran test claim, Copay is $0. This test claim was processed through [facility]- copay amounts may vary at other pharmacies due to pharmacy/plan contracts, or as the patient moves through the different stages of their insurance plan.   PA #/Case ID/Reference #: [id number]

## 2024-04-02 ENCOUNTER — Other Ambulatory Visit: Payer: Self-pay | Admitting: Podiatry

## 2024-04-02 MED ORDER — TRAMADOL HCL 50 MG PO TABS: 50.0000 mg | ORAL_TABLET | Freq: Three times a day (TID) | ORAL | 0 refills | Status: AC | PRN

## 2024-04-02 MED ORDER — ONDANSETRON HCL 4 MG PO TABS: 4.0000 mg | ORAL_TABLET | Freq: Three times a day (TID) | ORAL | 0 refills | Status: DC | PRN

## 2024-04-02 MED ORDER — CEPHALEXIN 500 MG PO CAPS: 500.0000 mg | ORAL_CAPSULE | Freq: Three times a day (TID) | ORAL | 0 refills | Status: DC

## 2024-04-03 ENCOUNTER — Ambulatory Visit: Admitting: Obstetrics and Gynecology

## 2024-04-04 DIAGNOSIS — D492 Neoplasm of unspecified behavior of bone, soft tissue, and skin: Secondary | ICD-10-CM | POA: Diagnosis not present

## 2024-04-07 ENCOUNTER — Encounter

## 2024-04-09 ENCOUNTER — Ambulatory Visit: Admitting: Obstetrics and Gynecology

## 2024-04-09 ENCOUNTER — Encounter: Payer: Self-pay | Admitting: Obstetrics and Gynecology

## 2024-04-09 VITALS — BP 134/82 | HR 90

## 2024-04-09 DIAGNOSIS — Z87440 Personal history of urinary (tract) infections: Secondary | ICD-10-CM

## 2024-04-09 DIAGNOSIS — N390 Urinary tract infection, site not specified: Secondary | ICD-10-CM

## 2024-04-09 DIAGNOSIS — L280 Lichen simplex chronicus: Secondary | ICD-10-CM

## 2024-04-09 MED ORDER — CLOBETASOL PROPIONATE 0.05 % EX OINT: TOPICAL_OINTMENT | Freq: Every evening | CUTANEOUS | 5 refills | Status: DC

## 2024-04-09 MED ORDER — NITROFURANTOIN MACROCRYSTAL 100 MG PO CAPS: 100.0000 mg | ORAL_CAPSULE | Freq: Every day | ORAL | 0 refills | Status: DC

## 2024-04-09 NOTE — Progress Notes (Signed)
 [facility] Return Visit  SUBJECTIVE  History of Present Illness: Texie Tupou is a 68 y.o. female seen in follow-up for Lichen sclerosus and recurrent UTI. Plan at last visit was start prophylactic low dose macrobid  daily, use clobetasol cream nightly for lichens, and use estrogen cream x2  weekly. Patient reports she has been keeping with the regimen and has had no further issues in the last 6 months.    Patient is requesting to stay on the low dose antibiotics.    Past Medical History: Patient  has a past medical history of Acid reflux, Allergy , Anal fissure, Anxiety, Arthritis, Barrett's esophagus, Bipolar disorder (HCC), Cervical herniated disc, Chronic headaches, Chronic pain, Depression, Dermatomyositis (HCC), GERD (gastroesophageal reflux disease), H/O vitamin D  deficiency, Hypercholesterolemia, IBS (irritable bowel syndrome), Ocular migraine, and Osteoporosis.   Past Surgical History: She  has a past surgical history that includes Appendectomy; Tonsillectomy (1981); Gallbladder surgery (2001); Esophagogastric fundoplication; toupetfundoplicatio (2000); Tubal ligation; Retinal detachment surgery (Right, 11/29/2021); Nerve Surgery (Right, 03/07/2023); and Ganglion cyst excision (Left).   Medications: She has a current medication list which includes the following prescription(s): aspirin-acetaminophen-caffeine, cephalexin , diphenhydramine , infliximab, claritin-d 24 hour, multiple vitamins-minerals, pantoprazole , nurtec, sucralfate , valacyclovir, clobetasol ointment, estradiol , gabapentin , hydroxychloroquine, and nitrofurantoin , and the following Facility-Administered Medications: methylprednisolone  acetate.   Allergies: Patient is allergic to codeine, garlic, other, and sertraline.   Social History: Patient  reports that she has never smoked. She has been exposed to tobacco smoke. She has never used smokeless tobacco. She reports current alcohol use of about 2.0  standard drinks of alcohol per week. She reports that she does not use drugs.     OBJECTIVE     Physical Exam: Vitals:   04/09/24 1317  BP: 134/82  Pulse: 90   Gen: No apparent distress, A&O x 3.  Detailed Urogynecologic Evaluation:  External vaginal exam shows still some mild lichen changes to bilateral vulvar lips and clitoral hood still obscured. There seems to be no progression or worsening of the lichens. It also appears that the posterior fourchette is no longer agglutinated. Vaginal tissues are well lubricated and supported.    ASSESSMENT AND PLAN    Ms. Emory is a 67 y.o. with:  1. Recurrent urinary tract infection   2. Lichen simplex chronicus    Patient has been doing well and reports upcoming travel and is hesitant to go off antibiotics. We discussed that long term it is not good to be on antibiotics for gut bacteria/normal flora and for resistances. We discussed a shortened time of 3 months this time and she is agreeable to that.  Patient continuing to use both the estrogen and Clobetasol creams. We will re-evaluate in a few months and see if we can decrease clobetasol use to 3 times per week to reduce risk of skin thinning. Patient is open to this plan of care as well.   Patient to follow up in 3 months or sooner if needed.     Sallee Hogrefe G Cowan Paone, NP'

## 2024-04-10 ENCOUNTER — Ambulatory Visit (INDEPENDENT_AMBULATORY_CARE_PROVIDER_SITE_OTHER): Admitting: Podiatry

## 2024-04-10 ENCOUNTER — Encounter

## 2024-04-10 VITALS — BP 139/75 | HR 79 | Temp 97.1°F

## 2024-04-10 DIAGNOSIS — D492 Neoplasm of unspecified behavior of bone, soft tissue, and skin: Secondary | ICD-10-CM

## 2024-04-10 NOTE — Progress Notes (Signed)
 Patient presents for post-op visit today, POV # 1 DOS 10/10 EXCISION OF SOFT TISSUE TUMOR POSTERIOR OF LEFT HEEL  It's good. I feel that it is there, it hurts but not terrible. Normal for what has happened..   Vital Signs: Today's Vitals   04/10/24 1048  BP: 139/75  Pulse: 79  Temp: (!) 97.1 F (36.2 C)  TempSrc: Oral  PainSc: 5   PainLoc: Foot    Radiographs: []  Taken [x]  Not taken  Surgical Site Assessment:  - Dressing:  [x]  Minimal dry blood, intact []  Reinforced   []  Changed     -RN Notes: n/a  - Incision:  [x]  CDI (clean, dry, intact)  [x]  Mild erythema  []  Drainage noted   -RN Notes: n/a  - Swelling:  []  None  [x]  Mild  []  Moderate   []  Significant    - Bruising:  []  None  [x]  Present: medial ankle  - Sutures/staples:  [x]  Intact  []  Removed Today  [x]  Plan to remove at next visit    -Cast/Splint/Pins: [x]  None []  Intact  []  Removed []  Plan to remove at next visit []  Replaced  -Signs of infection:  [x]  None  []  Present - Describe: n/a  -DME:    []  AFW [x]  Surgical shoe []  Cast  []  Splint  -Walking status:  [x]  Full WB  []  Partial WB  []  NWB  -Utilizing device:  [x]  None []  Knee Scooter []  Crutches []  Wheelchair    DVT assessment:  [x]  Denies symptoms []  Chest pain/SOB []  Pain in calf/redness/warmth   Redressed DSD and ace wrap. Educated on signs of infection, proper dressing care, pain management, and weight bearing status. Patient will contact provider with any new or worsening symptoms. The provider assessed the patient today and reviewed instructions regarding plan of care.  I saw this patient today after she was seen by the nurse.  The wound appears to be intact there is no dehiscence there is no scabbing.  There is no cellulitic process.  Sutures are in place.   well-healing surgical foot redressed today dressed a compressive dressing will follow-up with me in 2 weeks for suture removal.

## 2024-04-10 NOTE — Patient Instructions (Signed)

## 2024-04-11 ENCOUNTER — Ambulatory Visit: Admitting: Physician Assistant

## 2024-04-11 ENCOUNTER — Encounter: Payer: Self-pay | Admitting: Podiatry

## 2024-04-11 ENCOUNTER — Encounter: Payer: Self-pay | Admitting: Physician Assistant

## 2024-04-11 VITALS — BP 142/92 | HR 86 | Temp 97.5°F | Ht 68.0 in | Wt 157.6 lb

## 2024-04-11 DIAGNOSIS — G43701 Chronic migraine without aura, not intractable, with status migrainosus: Secondary | ICD-10-CM

## 2024-04-11 DIAGNOSIS — R490 Dysphonia: Secondary | ICD-10-CM | POA: Diagnosis not present

## 2024-04-11 DIAGNOSIS — G8918 Other acute postprocedural pain: Secondary | ICD-10-CM

## 2024-04-11 DIAGNOSIS — E0789 Other specified disorders of thyroid: Secondary | ICD-10-CM | POA: Diagnosis not present

## 2024-04-11 DIAGNOSIS — H9313 Tinnitus, bilateral: Secondary | ICD-10-CM

## 2024-04-11 DIAGNOSIS — M5412 Radiculopathy, cervical region: Secondary | ICD-10-CM | POA: Diagnosis not present

## 2024-04-11 DIAGNOSIS — R499 Unspecified voice and resonance disorder: Secondary | ICD-10-CM

## 2024-04-11 DIAGNOSIS — R232 Flushing: Secondary | ICD-10-CM | POA: Diagnosis not present

## 2024-04-11 DIAGNOSIS — R030 Elevated blood-pressure reading, without diagnosis of hypertension: Secondary | ICD-10-CM

## 2024-04-11 LAB — COMPREHENSIVE METABOLIC PANEL WITH GFR
ALT: 104 U/L — ABNORMAL HIGH (ref 0–35)
AST: 36 U/L (ref 0–37)
Albumin: 4.3 g/dL (ref 3.5–5.2)
Alkaline Phosphatase: 112 U/L (ref 39–117)
BUN: 16 mg/dL (ref 6–23)
CO2: 31 meq/L (ref 19–32)
Calcium: 9.2 mg/dL (ref 8.4–10.5)
Chloride: 102 meq/L (ref 96–112)
Creatinine, Ser: 0.77 mg/dL (ref 0.40–1.20)
GFR: 79.12 mL/min (ref >60.00)
Glucose, Bld: 90 mg/dL (ref 70–99)
Potassium: 4.8 meq/L (ref 3.5–5.1)
Sodium: 142 meq/L (ref 135–145)
Total Bilirubin: 0.4 mg/dL (ref 0.2–1.2)
Total Protein: 6.9 g/dL (ref 6.0–8.3)

## 2024-04-11 LAB — SEDIMENTATION RATE: Sed Rate: 10 mm/h (ref 0–30)

## 2024-04-11 LAB — CBC WITH DIFFERENTIAL/PLATELET
Basophils Absolute: 0.1 K/uL (ref 0.0–0.1)
Basophils Relative: 0.9 % (ref 0.0–3.0)
Eosinophils Absolute: 0.5 K/uL (ref 0.0–0.7)
Eosinophils Relative: 6.9 % — ABNORMAL HIGH (ref 0.0–5.0)
HCT: 40 % (ref 36.0–46.0)
Hemoglobin: 13.1 g/dL (ref 12.0–15.0)
Lymphocytes Relative: 18.8 % (ref 12.0–46.0)
Lymphs Abs: 1.3 K/uL (ref 0.7–4.0)
MCHC: 32.7 g/dL (ref 30.0–36.0)
MCV: 87.4 fl (ref 78.0–100.0)
Monocytes Absolute: 0.4 K/uL (ref 0.1–1.0)
Monocytes Relative: 5.8 % (ref 3.0–12.0)
Neutro Abs: 4.5 K/uL (ref 1.4–7.7)
Neutrophils Relative %: 67.6 % (ref 43.0–77.0)
Platelets: 305 K/uL (ref 150.0–400.0)
RBC: 4.58 Mil/uL (ref 3.87–5.11)
RDW: 13.7 % (ref 11.5–15.5)
WBC: 6.7 K/uL (ref 4.0–10.5)

## 2024-04-11 LAB — TSH: TSH: 1.63 u[IU]/mL (ref 0.35–5.50)

## 2024-04-11 LAB — C-REACTIVE PROTEIN: CRP: <0.5 mg/dL (ref 0.5–20.0)

## 2024-04-11 NOTE — Progress Notes (Signed)
 "   Patient ID: Tina Pena, female    DOB: 1955/07/26, 68 y.o.   MRN: [id number]   Assessment & Plan:  Chronic migraine without aura with status migrainosus, not intractable  Cervical radiculopathy  Flushing -     CBC with Differential/Platelet -     Comprehensive metabolic panel with GFR -     TSH -     Sedimentation rate -     C-reactive protein -     US  THYROID ; Future  Hoarseness -     TSH -     US  THYROID ; Future -     Ambulatory referral to ENT  Thyroid  pain -     TSH -     US  THYROID ; Future -     Ambulatory referral to ENT  Tinnitus, bilateral -     Ambulatory referral to ENT  Hoarseness or changing voice -     Ambulatory referral to ENT  Elevated blood pressure reading without diagnosis of hypertension  Postoperative pain     Assessment & Plan Migraine with chronic headache Chronic migraines with increased frequency and severity, possibly related to cervical disc issues. Recent increase in headaches and tinnitus post-surgery. Current treatment includes Nurtec and Botox  every three months. Gabapentin  added by neurologist.  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 Neurologist plans to administer a steroid epidural injection to manage pain. She is nervous about the procedure but reassured by past experiences of others. - Proceed with scheduled steroid epidural injection. - Monitor for pain relief and any changes in symptoms.  Tinnitus, right greater than left Bilateral tinnitus, worse in the right ear, with no significant hearing loss noted. Previous ENT evaluation was unsatisfactory. - Refer to ENT for further evaluation of tinnitus and potential laryngoscopy.  Hoarseness and voice change Hoarseness and voice changes possibly related to previous GERD and vocal cord damage. Symptoms have worsened over the past six months. - Refer to ENT for  evaluation and possible laryngoscopy to assess vocal cords.  Elevated blood pressure Hypertension with recent elevated readings, possibly exacerbated by pain and stress. Blood pressure was 142/92 today. Goal is to maintain blood pressure at 130/80. She is comfortable and agreeable with the plan to monitor and potentially start medication if needed. - Provide blood pressure log for home monitoring twice daily. - Set blood pressure goal at 130/80. - Instruct to MyChart message if readings remain elevated for potential initiation of antihypertensive medication. - Plan to reassess blood pressure in three months.  Possible thyroid  disorder with thyroid  pain Possible thyroid  disorder with recent onset of thyroid  pain and sensation of a lump in the throat. Family history of thyroid  issues noted. - Order thyroid  ultrasound to assess for structural abnormalities. - Check TSH levels to evaluate thyroid  function.  Temperature dysregulation with flushing and daytime sweating Persistent temperature dysregulation with daytime sweating and flushing, not associated with night sweats. Symptoms have been present since summer. - Check TSH levels to assess thyroid  function. - Consider further evaluation if symptoms persist.  Arthralgia of multiple sites Arthralgia in multiple joints, possibly related to arthritis and exacerbated by cold weather. - Monitor symptoms and manage pain as needed.  Postoperative state, right foot ganglion cyst removal Postoperative state following right foot ganglion cyst removal.      Return in about 3 months (around 07/12/2024) for recheck/follow-up.    Subjective:    Chief Complaint  Patient presents with  Medical Management of Chronic Issues    Pt states bp been high last few OV with other providers; pt still having ringing in ears and has got worse. Pt not happy with last audiologist; headaches have been more often despite using Nurtec. Headaches could be related  to pain or high BP.    HPI Discussed the use of AI scribe software for clinical note transcription with the patient, who gave verbal consent to proceed.  History of Present Illness Tina Pena is a 68 year old female who presents for an eight-week follow-up regarding recurrent falls and balance issues.  She has not experienced any falls recently and feels her balance has improved since discontinuing tramadol , which she had been taking for a long time. She reports that she has not experienced any falls recently and feels her balance has improved since discontinuing tramadol .  She has a history of chronic migraines since 2014 and cervical disc issues. She was receiving Botox  treatments every three months, which initially reduced the frequency of her migraines. However, her headaches have become more frequent and severe, particularly after her recent surgery for a ganglion cyst removal. She is currently using Nurtec for her migraines. Additionally, she experiences increased tinnitus, which has worsened recently.  She reports high blood pressure readings, particularly during Remicade infusions, with a noted reading of 170/?. Her blood pressure was 148/80 during her recent surgery, which she attributes to being 'excited'. She acknowledges that pain and anxiety may contribute to her elevated blood pressure.  She describes experiencing generalized pain, including in her head, neck, joints, hands, knee, and foot, which she attributes to arthritis. Colder weather exacerbates her symptoms. She also reports being hot all the time, despite having gone through menopause, and experiences excessive sweating during the day without significant physical activity. She questions whether this could be related to her Remicade treatment, which she has been on since 2019.  She mentions a history of GERD and previous surgery for erosive esophagitis, which improved her symptoms. However, she has noticed a return of  hoarseness over the past six months, which worsens with pain. She takes Protonix  and Carafate  as needed. She has not had a recent ENT evaluation for her vocal cords.  She reports a history of cervical disc herniation at levels C5, C6, and C7, which has worsened. She is scheduled to receive a steroid and epidural injection. She reports that her pain radiates from her neck downwards.  Her current medications include gabapentin , which she takes three times a day, although she sometimes takes it less frequently. She is unsure if the gabapentin  is related to her temperature regulation issues.  Her daughter had a goiter, but the patient herself has not been diagnosed with thyroid  problems. She reports a sensation of a lump in her throat and difficulty swallowing.  She has undergone a hearing evaluation in April of the previous year, which showed normal hearing with mild downsloping. She reports tinnitus, more pronounced in the right ear, and a sensation of hearing loss, although no significant hearing loss was found in the previous evaluation.     Past Medical History:  Diagnosis Date   Acid reflux    Allergy     Anal fissure    Anxiety    Arthritis    Barrett's esophagus    Bipolar disorder (HCC)    Cervical herniated disc    Chronic headaches    Chronic pain    Depression    Dermatomyositis (HCC)    GERD (gastroesophageal reflux  disease)    H/O vitamin D  deficiency    Hypercholesterolemia    IBS (irritable bowel syndrome)    Ocular migraine    Osteoporosis     Past Surgical History:  Procedure Laterality Date   APPENDECTOMY     ESOPHAGOGASTRIC FUNDOPLICATION     GALLBLADDER SURGERY  2001   GANGLION CYST EXCISION Left    NERVE SURGERY Right 03/07/2023   [facility]- Right ARM   RETINAL DETACHMENT SURGERY Right 11/29/2021   TONSILLECTOMY  1981   toupetfundoplicatio  2000   TUBAL LIGATION      Family History  Problem Relation Age of Onset   Cancer Mother    Lung cancer  Mother    COPD Father    Cancer Father        breast   Bipolar disorder Father    Anxiety disorder Father    Depression Father    Paranoid behavior Father    Heart disease Father    Stroke Maternal Grandmother    Hypertension Maternal Grandmother    Bipolar disorder Maternal Grandmother    Dementia Maternal Grandmother    Hypertension Maternal Grandfather    Stroke Paternal Grandmother    Hypertension Paternal Grandmother    Bipolar disorder Paternal Grandmother    Dementia Paternal Grandmother    Hypertension Paternal Grandfather    Stomach cancer Neg Hx    Colon cancer Neg Hx    Esophageal cancer Neg Hx    Rectal cancer Neg Hx    Bladder Cancer Neg Hx    Uterine cancer Neg Hx    Renal cancer Neg Hx     Social History   Tobacco Use   Smoking status: Never    Passive exposure: Past   Smokeless tobacco: Never  Vaping Use   Vaping status: Never Used  Substance Use Topics   Alcohol use: Yes    Alcohol/week: 2.0 standard drinks of alcohol    Types: 2 Glasses of wine per week    Comment: occasional glass of wine   Drug use: Never     Allergies  Allergen Reactions   Codeine Nausea And Vomiting    Other reaction(s): extreme stomach upset, GI Intolerance   Garlic Nausea And Vomiting   Other Nausea And Vomiting    Onions    Sertraline Rash and Other (See Comments)    Other reaction(s): Confusion  Other reaction(s): confusion, Other (See Comments)    Review of Systems NEGATIVE UNLESS OTHERWISE INDICATED IN HPI      Objective:     BP (!) 142/92 (BP Location: Left Arm, Patient Position: Sitting)   Pulse 86   Temp (!) 97.5 F (36.4 C) (Temporal)   Ht 5' 8 (1.727 m)   Wt 157 lb 9.6 oz (71.5 kg)   SpO2 98%   BMI 23.96 kg/m   Wt Readings from Last 3 Encounters:  04/11/24 157 lb 9.6 oz (71.5 kg)  02/11/24 158 lb (71.7 kg)  10/05/23 163 lb (73.9 kg)    BP Readings from Last 3 Encounters:  04/11/24 (!) 142/92  04/10/24 139/75  04/09/24 134/82      Physical Exam Vitals and nursing note reviewed.  Constitutional:      Appearance: Normal appearance.  Eyes:     Extraocular Movements: Extraocular movements intact.     Conjunctiva/sclera: Conjunctivae normal.     Pupils: Pupils are equal, round, and reactive to light.  Neck:     Thyroid : Thyroid  tenderness (mild) present. No thyroid  mass or  thyromegaly.  Cardiovascular:     Rate and Rhythm: Normal rate and regular rhythm.     Pulses: Normal pulses.     Heart sounds: Normal heart sounds. No murmur heard. Pulmonary:     Effort: Pulmonary effort is normal.     Breath sounds: Normal breath sounds.  Abdominal:     General: Abdomen is flat. Bowel sounds are normal.     Palpations: Abdomen is soft.  Lymphadenopathy:     Cervical: No cervical adenopathy.     Right cervical: No superficial cervical adenopathy.    Left cervical: No superficial cervical adenopathy.  Skin:    Findings: Bruising present. No rash.  Neurological:     General: No focal deficit present.     Mental Status: She is alert and oriented to person, place, and time.     Cranial Nerves: No cranial nerve deficit.     Sensory: No sensory deficit.     Motor: No weakness.     Coordination: Romberg sign negative. Coordination normal.     Gait: Gait normal.  Psychiatric:        Mood and Affect: Mood normal.             Hatim Homann M Taj Nevins, PA-C "

## 2024-04-13 ENCOUNTER — Ambulatory Visit: Payer: Self-pay | Admitting: Physician Assistant

## 2024-04-14 ENCOUNTER — Telehealth: Payer: Self-pay | Admitting: Podiatry

## 2024-04-14 ENCOUNTER — Encounter: Payer: Self-pay | Admitting: Lab

## 2024-04-14 NOTE — Telephone Encounter (Signed)
 Thank you for the update!

## 2024-04-14 NOTE — Telephone Encounter (Signed)
 PO-Op DOS 10/10- took shower and got area wet. It's dry now but want to make aware to make sure everything is ok.

## 2024-04-15 ENCOUNTER — Ambulatory Visit
Admission: RE | Admit: 2024-04-15 | Discharge: 2024-04-15 | Disposition: A | Discharge status: Home / Self Care | Source: Ambulatory Visit | Attending: Physician Assistant | Admitting: Physician Assistant

## 2024-04-15 ENCOUNTER — Other Ambulatory Visit: Payer: Self-pay

## 2024-04-15 DIAGNOSIS — R232 Flushing: Secondary | ICD-10-CM

## 2024-04-15 DIAGNOSIS — R7989 Other specified abnormal findings of blood chemistry: Secondary | ICD-10-CM

## 2024-04-15 DIAGNOSIS — R490 Dysphonia: Secondary | ICD-10-CM

## 2024-04-15 DIAGNOSIS — E0789 Other specified disorders of thyroid: Secondary | ICD-10-CM

## 2024-04-16 NOTE — Telephone Encounter (Signed)
 Patient agreeable, please place order for patient

## 2024-04-17 ENCOUNTER — Other Ambulatory Visit: Payer: Self-pay | Admitting: Physician Assistant

## 2024-04-17 DIAGNOSIS — E041 Nontoxic single thyroid nodule: Secondary | ICD-10-CM

## 2024-04-24 ENCOUNTER — Ambulatory Visit (INDEPENDENT_AMBULATORY_CARE_PROVIDER_SITE_OTHER): Admitting: Podiatry

## 2024-04-24 DIAGNOSIS — D492 Neoplasm of unspecified behavior of bone, soft tissue, and skin: Secondary | ICD-10-CM

## 2024-04-24 NOTE — Progress Notes (Unsigned)
 Patient presents for post-op visit today, POV # 2 DOS 10/10 EXCISION OF SOFT TISSUE TUMOR POSTERIOR OF LEFT HEEL  Doing great. Does not seem to hurt any. Did have to unwrap it once because it got wet. Feels fine, feels like the swelling is down..  RN Notes: n/a  Vital Signs: Today's Vitals   04/24/24 1316  PainSc: 0-No pain      Radiographs: []  Taken [x]  Not taken  Surgical Site Assessment:  - Dressing:  [x]  Minimal dry blood, intact []  Reinforced   []  Changed     -RN Notes: n/a  - Incision:  [x]  CDI (clean, dry, intact)  []  Mild erythema  []  Drainage noted   -RN Notes: n/a  - Swelling:  [x]  None  []  Mild  []  Moderate   []  Significant     -RN Notes: n/a  - Bruising:  [x]  None  []  Present: n/a   - Sutures/Staples:  []  None [x]  Intact  [x]  Removed Today  []  Plan to remove at next visit   -Cast/Splint/Pins: [x]  None []  Intact []  Removed Today []  Plan to remove at next visit []  Replaced  -Signs of infection:  [x]  None  []  Present - Describe: n/a  -DME:    []  None []  AFW [x]  Surgical shoe []  Cast  []  Splint  -Walking status:  [x]  Full WB  []  Partial WB  []  NWB  -Utilizing device:  [x]  None []  Knee Scooter []  Crutches []  Wheelchair    DVT assessment:  [x]  Denies symptoms []  Chest pain/SOB []  Pain in calf/redness/warmth   Redressed DSD and ace wrap. Educated on signs of infection, proper dressing care, pain management, and weight bearing status. Patient will contact provider with any new or worsening symptoms. The provider assessed the patient today and reviewed instructions regarding plan of care.  She presents today date of surgery 04/04/2024 excision soft tissue mass posterior aspect of the left heel.  She states that is doing great no problems whatsoever did have to unwrap it once because I got it wet.  Suture at an incision site are intact I see no dehiscence.  Our nurse remove the sutures today margins remain well coapted she is allowed to get back to her regular  activities we will follow-up with her on an as-needed basis.

## 2024-05-02 ENCOUNTER — Telehealth: Payer: Self-pay | Admitting: Internal Medicine

## 2024-05-02 NOTE — Telephone Encounter (Signed)
 Inbound call from patient stating she is having issues with going to the bathroom. She states she can go 5-6 days and not have a bowel movement. She states that she will have to use up to 6 laxatives  and she still will have issues with having the bowel movement. Patient was offered an appointment with PA on 12/2 and denied stating she could not wait that long to be seen and requested to speak with nurse. Please advise.

## 2024-05-02 NOTE — Telephone Encounter (Signed)
 Spoke with patient. Appointment made with [facility] 11/17 @ 1:30. Suggested patient try 7 caps of Miralax in 32 ounces of fluid to see if it will promote a BM.  Patient currently taking Dulcolax and single doses of Miralax.

## 2024-05-12 ENCOUNTER — Ambulatory Visit (INDEPENDENT_AMBULATORY_CARE_PROVIDER_SITE_OTHER): Admitting: Gastroenterology

## 2024-05-12 ENCOUNTER — Encounter: Payer: Self-pay | Admitting: Gastroenterology

## 2024-05-12 VITALS — BP 126/72 | HR 88 | Ht 67.25 in | Wt 165.2 lb

## 2024-05-12 DIAGNOSIS — K2990 Gastroduodenitis, unspecified, without bleeding: Secondary | ICD-10-CM

## 2024-05-12 DIAGNOSIS — R490 Dysphonia: Secondary | ICD-10-CM | POA: Diagnosis not present

## 2024-05-12 DIAGNOSIS — K219 Gastro-esophageal reflux disease without esophagitis: Secondary | ICD-10-CM | POA: Diagnosis not present

## 2024-05-12 DIAGNOSIS — Z860100 Personal history of colon polyps, unspecified: Secondary | ICD-10-CM | POA: Diagnosis not present

## 2024-05-12 DIAGNOSIS — K5909 Other constipation: Secondary | ICD-10-CM | POA: Diagnosis not present

## 2024-05-12 DIAGNOSIS — R1319 Other dysphagia: Secondary | ICD-10-CM

## 2024-05-12 DIAGNOSIS — K2970 Gastritis, unspecified, without bleeding: Secondary | ICD-10-CM

## 2024-05-12 MED ORDER — NA SULFATE-K SULFATE-MG SULF 17.5-3.13-1.6 GM/177ML PO SOLN: 1.0000 | Freq: Once | ORAL | 0 refills | Status: AC

## 2024-05-12 MED ORDER — PANTOPRAZOLE SODIUM 40 MG PO TBEC: 40.0000 mg | DELAYED_RELEASE_TABLET | Freq: Two times a day (BID) | ORAL | 1 refills | Status: AC

## 2024-05-12 NOTE — Progress Notes (Signed)
 Chief Complaint:follow-up constipation Primary GI Doctor:Dr. Federico  HPI: 68 year old female with history of GERD, bipolar disorder, arthritis, and osteoporosis presents for follow up of acid reflux, chronic constipation, and anal fissure.   Patient last seen in the GI office by Dr. Federico on 10/05/2023.   Interval History  Patient presents for follow-up on GERD and constipation Patient has history of GERD and currently on pantoprazole  40mg  po daily. Patient c/o hoarseness . Patient also intermittent eso dysphagia. No nausea or vomiting. Appetite is good. Weight stable. She reports gaining weight. No changes to diet. She exercises regular. Post menopausal.    Patient also has history of slow transit constipation. Patient has bowel movement every 5-6 days and the stool is hard. Patients had her Amitiza  increased to 24 mcg BID at last appointment which she states never worked for her.  She did a bowel purge with Miralax which didn't move her bowels much. Previously Movantik did not seem to work, MiraLAX causes her a lot of bloating, Linzess worked too well for her and caused her diarrhea. Patient has generalized abdominal pain with bloating.  Wt Readings from Last 3 Encounters:  05/12/24 165 lb 4 oz (75 kg)  04/11/24 157 lb 9.6 oz (71.5 kg)  02/11/24 158 lb (71.7 kg)    Past Medical History:  Diagnosis Date   Acid reflux    Allergy     Anal fissure    Anxiety    Arthritis    Barrett's esophagus    Bipolar disorder (HCC)    Cervical herniated disc    Chronic headaches    Chronic pain    Depression    Dermatomyositis (HCC)    GERD (gastroesophageal reflux disease)    H/O vitamin D  deficiency    Hypercholesterolemia    IBS (irritable bowel syndrome)    Ocular migraine    Osteoporosis     Past Surgical History:  Procedure Laterality Date   APPENDECTOMY     ESOPHAGOGASTRIC FUNDOPLICATION     GALLBLADDER SURGERY  2001   GANGLION CYST EXCISION Left    NERVE SURGERY  Right 03/07/2023   [facility]- Right ARM   RETINAL DETACHMENT SURGERY Right 11/29/2021   TONSILLECTOMY  1981   toupetfundoplicatio  2000   TUBAL LIGATION      Current Outpatient Medications  Medication Sig Dispense Refill   aspirin-acetaminophen-caffeine (EXCEDRIN MIGRAINE) 250-250-65 MG tablet Take 1 tablet by mouth every 6 (six) hours as needed for headache.     atomoxetine (STRATTERA) 60 MG capsule Take 60 mg by mouth every morning.     bisacodyl (DULCOLAX) 5 MG EC tablet Take 5 mg by mouth as needed for moderate constipation (every 5 days).     clobetasol ointment (TEMOVATE) 0.05 % Apply topically at bedtime. 60 g 5   diphenhydrAMINE  (BENADRYL ) 25 mg capsule Take 25-50 mg by mouth every 6 (six) hours as needed for itching.     docusate sodium (COLACE) 100 MG capsule Take 200-300 mg by mouth daily.     DULoxetine  (CYMBALTA ) 60 MG capsule Take 60 mg by mouth 2 (two) times daily.     estradiol  (ESTRACE ) 0.1 MG/GM vaginal cream PLACE 0.5G NIGHTLY FOR TWO WEEKS THEN TWICE A WEEK AFTER 42.5 g 11   gabapentin  (NEURONTIN ) 300 MG capsule Take 1 capsule by mouth 3 (three) times daily.     hydroxychloroquine (PLAQUENIL) 200 MG tablet Take 200 mg by mouth 2 (two) times daily.     inFLIXimab (REMICADE) 100 MG injection Infused every  6 weeks     loratadine-pseudoephedrine (CLARITIN-D 24 HOUR) 10-240 MG 24 hr tablet Take 1 tablet by mouth as needed.     Multiple Vitamins-Minerals (MULTI ADULT GUMMIES PO) Take by mouth.     Na Sulfate-K Sulfate-Mg Sulfate concentrate (SUPREP) 17.5-3.13-1.6 GM/177ML SOLN Take 1 kit (354 mLs total) by mouth once for 1 dose. 354 mL 0   nitrofurantoin  (MACRODANTIN ) 100 MG capsule Take 1 capsule (100 mg total) by mouth daily. 90 capsule 0   Rimegepant Sulfate (NURTEC) 75 MG TBDP Take 1 tablet (75 mg total) by mouth as needed. 16 tablet 11   sucralfate  (CARAFATE ) 1 g tablet Take 1 tablet (1 g total) by mouth 2 (two) times daily. 180 tablet 1   valACYclovir (VALTREX) 1000  MG tablet Take by mouth as needed.     pantoprazole  (PROTONIX ) 40 MG tablet Take 1 tablet (40 mg total) by mouth 2 (two) times daily. 90 tablet 1   Current Facility-Administered Medications  Medication Dose Route Frequency Provider Last Rate Last Admin   methylPREDNISolone  acetate (DEPO-MEDROL ) injection 80 mg  80 mg Intramuscular Once         Allergies as of 05/12/2024 - Review Complete 05/12/2024  Allergen Reaction Noted   Codeine Nausea And Vomiting 01/18/2018   Garlic Nausea And Vomiting 01/18/2018   Other Nausea And Vomiting 01/18/2018   Zoloft [sertraline] Rash and Other (See Comments) 12/12/2016    Family History  Problem Relation Age of Onset   Cancer Mother    Lung cancer Mother    COPD Father    Cancer Father        breast   Bipolar disorder Father    Anxiety disorder Father    Depression Father    Paranoid behavior Father    Heart disease Father    Stroke Maternal Grandmother    Hypertension Maternal Grandmother    Bipolar disorder Maternal Grandmother    Dementia Maternal Grandmother    Hypertension Maternal Grandfather    Stroke Paternal Grandmother    Hypertension Paternal Grandmother    Bipolar disorder Paternal Grandmother    Dementia Paternal Grandmother    Hypertension Paternal Grandfather    Stomach cancer Neg Hx    Colon cancer Neg Hx    Esophageal cancer Neg Hx    Rectal cancer Neg Hx    Bladder Cancer Neg Hx    Uterine cancer Neg Hx    Renal cancer Neg Hx     Review of Systems:    Constitutional: No weight loss, fever, chills, weakness or fatigue HEENT: Eyes: No change in vision               Ears, Nose, Throat:  No change in hearing or congestion Skin: No rash or itching Cardiovascular: No chest pain, chest pressure or palpitations   Respiratory: No SOB or cough Gastrointestinal: See HPI and otherwise negative Genitourinary: No dysuria or change in urinary frequency Neurological: No headache, dizziness or syncope Musculoskeletal: No new  muscle or joint pain Hematologic: No bleeding or bruising Psychiatric: No history of depression or anxiety    Physical Exam:  Vital signs: BP 126/72 (BP Location: Left Arm, Patient Position: Sitting, Cuff Size: Normal)   Pulse 88   Ht 5' 7.25 (1.708 m) Comment: height measured without shoes  Wt 165 lb 4 oz (75 kg)   BMI 25.69 kg/m   Constitutional:   Pleasant female appears to be in NAD, Well developed, Well nourished, alert and cooperative Throat: Oral cavity and  pharynx without inflammation, swelling or lesion.  Respiratory: Respirations even and unlabored. Lungs clear to auscultation bilaterally.   No wheezes, crackles, or rhonchi.  Cardiovascular: Normal S1, S2. Regular rate and rhythm. No peripheral edema, cyanosis or pallor.  Gastrointestinal:  Soft, nondistended, nontender. No rebound or guarding. Hypoactive bowel sounds. No appreciable masses or hepatomegaly. Rectal:  Not performed.  Msk:  Symmetrical without gross deformities. Without edema, no deformity or joint abnormality.  Neurologic:  Alert and  oriented x4;  grossly normal neurologically.  Skin:   Dry and intact without significant lesions or rashes.  RELEVANT LABS AND IMAGING: CBC    Latest Ref Rng & Units 04/11/2024   11:47 AM 02/11/2024   11:11 AM 05/29/2023    2:36 PM  CBC  WBC 4.0 - 10.5 K/uL 6.7  5.2  6.4   Hemoglobin 12.0 - 15.0 g/dL 86.8  87.1  86.7   Hematocrit 36.0 - 46.0 % 40.0  39.2  40.5   Platelets 150.0 - 400.0 K/uL 305.0  276.0  314.0      CMP     Latest Ref Rng & Units 04/11/2024   11:47 AM 02/11/2024   11:11 AM 05/29/2023    2:36 PM  CMP  Glucose 70 - 99 mg/dL 90  73  88   BUN 6 - 23 mg/dL 16  20  18    Creatinine 0.40 - 1.20 mg/dL 9.22  9.25  9.23   Sodium 135 - 145 mEq/L 142  140  137   Potassium 3.5 - 5.1 mEq/L 4.8  4.1  3.9   Chloride 96 - 112 mEq/L 102  101  100   CO2 19 - 32 mEq/L 31  31  31    Calcium 8.4 - 10.5 mg/dL 9.2  8.7  9.0   Total Protein 6.0 - 8.3 g/dL 6.9  6.9  6.9    Total Bilirubin 0.2 - 1.2 mg/dL 0.4  0.3  0.4   Alkaline Phos 39 - 117 U/L 112  67  73   AST 0 - 37 U/L 36  29  23   ALT 0 - 35 U/L 104  30  22      Lab Results  Component Value Date   TSH 1.63 04/11/2024  Labs 05/2023: CBC nml. CMP nml. ESR nml. Alpha gal panel negative.   Colonoscopy 04/12/23:Recall 3 years  - The examined portion of the ileum was normal. - Diverticulosis in the entire examined colon. - One 12 mm polyp in the ascending colon, removed with a cold snare. Resected and retrieved. - Non- bleeding hemorrhoids Path: 1. Surgical [P], colon, ascending, polyp (1) :       - TUBULAR ADENOMA.       - NO HIGH GRADE DYSPLASIA OR MALIGNANCY.    03/14/22 EGD - Esophageal mucosal variant. Biopsied. - Salmon- colored mucosa classified as Barrett' s stage C0- M1 per [place] criteria. Biopsied. - Gastritis. Biopsied. - Normal examined duodenum. Path: 1. Surgical [P], gastric - GASTRIC ANTRAL MUCOSA WITH NONSPECIFIC REACTIVE GASTROPATHY - GASTRIC OXYNTIC MUCOSA WITH NO SPECIFIC HISTOPATHOLOGIC CHANGES - HELICOBACTER PYLORI-LIKE ORGANISMS ARE NOT IDENTIFIED ON ROUTINE H&E STAIN 2. Surgical [P], GE junction salmon color mucosa - ESOPHAGEAL SQUAMOUS AND CARDIAC MUCOSA WITH NO SPECIFIC HISTOPATHOLOGIC CHANGES - NEGATIVE FOR INTESTINAL METAPLASIA OR DYSPLASIA 3. Surgical [P], esophageal - ESOPHAGEAL SQUAMOUS AND CARDIAC MUCOSA WITH NO SPECIFIC HISTOPATHOLOGIC CHANGES - NEGATIVE FOR INTESTINAL METAPLASIA OR DYSPLASIA  Assessment: Encounter Diagnoses  Name Primary?   Chronic constipation Yes  History of colonic polyps    Gastroesophageal reflux disease, unspecified whether esophagitis present    Hoarseness    Esophageal dysphagia    Gastritis and gastroduodenitis   68 year old female patient that presents with slow transit constipation and suspected GERD.   For the constipation patient has tried and failed Movantik, Linzess, Amitiza , and over-the-counter laxative such as MiraLAX.   Will go ahead and have the patient do a colonoscopy prep purge on day #1 followed by samples of ibsrela. UTD on colonoscopy 10/24, recall 3 years.    Patient also presents with hoarseness and esophageal dysphagia inquiring if it could be potentially due to uncontrolled reflux.  Will go ahead and trial twice daily PPI therapy for a couple of months to see if her symptoms improve.  Patient is also being evaluated for thyroid  nodules as well as seasonal allergies with postnasal drip which could overlap her current symptoms.  Will reevaluate after her follow-up with endocrinology and ENT.  Last EGD was in September 2023 with gastritis and negative biopsies.  Plan: -Bowel purge day #1 -samples ibsrela 50mg  twice daily day #2 -Could consider pelvic floor PT in the future  - Increase pantoprazole  40 mg to twice daily for 4 weeks. Sucralfate  PRN -if no improvement consider switching to new PPI -follow-up with ENT as scheduled -follow-up with endocrinology as scheduled - Next colonoscopy due in 03/2026 for polyp surveillance  Thank you for the courtesy of this consult. Please call me with any questions or concerns.   Anzleigh Slaven, FNP-C [facility] 05/12/2024, 4:25 PM  Cc: Allwardt, Mardy HERO, PA-C

## 2024-05-12 NOTE — Patient Instructions (Addendum)
 Constipation Bowel purge day #1 Day #2- take samples  Ibsrela 50 mg twice daily , take 1 capsule immediately before breakfast and dinner  GERD Increase pantoprazole  40 mg twice daily GERD diet -follow-up with ENT as scheduled -follow-up with endocrinology as scheduled  _______________________________________________________  If your blood pressure at your visit was 140/90 or greater, please contact your primary care physician to follow up on this.  _______________________________________________________  If you are age 16 or older, your body mass index should be between 23-30. Your Body mass index is 25.69 kg/m. If this is out of the aforementioned range listed, please consider follow up with your Primary Care Provider.  If you are age 106 or younger, your body mass index should be between 19-25. Your Body mass index is 25.69 kg/m. If this is out of the aformentioned range listed, please consider follow up with your Primary Care Provider.   ________________________________________________________  The [facility] providers would like to encourage you to use MYCHART to communicate with providers for non-urgent requests or questions.  Due to long hold times on the telephone, sending your provider a message by [facility] may be a faster and more efficient way to get a response.  Please allow 48 business hours for a response.  Please remember that this is for non-urgent requests.  _______________________________________________________  [facility] is using a team-based approach to care.  Your team is made up of your doctor and two to three APPS. Our APPS (Nurse Practitioners and Physician Assistants) work with your physician to ensure care continuity for you. They are fully qualified to address your health concerns and develop a treatment plan. They communicate directly with your gastroenterologist to care for you. Seeing the Advanced Practice Practitioners on your physician's team  can help you by facilitating care more promptly, often allowing for earlier appointments, access to diagnostic testing, procedures, and other specialty referrals.   Thank you for trusting me with your gastrointestinal care. Deanna May, FNP-C

## 2024-05-15 ENCOUNTER — Encounter: Admitting: Podiatry

## 2024-05-16 ENCOUNTER — Other Ambulatory Visit

## 2024-05-19 ENCOUNTER — Encounter (INDEPENDENT_AMBULATORY_CARE_PROVIDER_SITE_OTHER): Payer: Self-pay

## 2024-05-19 ENCOUNTER — Ambulatory Visit (INDEPENDENT_AMBULATORY_CARE_PROVIDER_SITE_OTHER)

## 2024-05-19 VITALS — BP 132/81 | HR 81 | Wt 165.0 lb

## 2024-05-19 DIAGNOSIS — R490 Dysphonia: Secondary | ICD-10-CM

## 2024-05-19 DIAGNOSIS — J383 Other diseases of vocal cords: Secondary | ICD-10-CM

## 2024-05-19 DIAGNOSIS — E041 Nontoxic single thyroid nodule: Secondary | ICD-10-CM | POA: Diagnosis not present

## 2024-05-19 DIAGNOSIS — H9313 Tinnitus, bilateral: Secondary | ICD-10-CM

## 2024-05-19 DIAGNOSIS — R1310 Dysphagia, unspecified: Secondary | ICD-10-CM

## 2024-05-20 ENCOUNTER — Telehealth: Payer: Self-pay | Admitting: Gastroenterology

## 2024-05-20 NOTE — Telephone Encounter (Signed)
 Inbound call from patient stating she was given samples after last office visit on 05/12/24 of ibsrela  50mg  and patient states medication has been working for her and would like a prescription sent to pharmacy. Patient also stated she is going out of town tomorrow and would like to know if prescription can be sent in today Please advise  Thank you

## 2024-05-20 NOTE — Progress Notes (Unsigned)
 HPI:   Discussed the use of AI scribe software for clinical note transcription with the patient, who gave verbal consent to proceed.  History of Present Illness Tina Pena is a 68 year old female with thyroid  nodules and GERD who presents with worsening voice changes and tinnitus.  She has been experiencing throat pain, initially thought to be related to her thyroid , which led to an ultrasound revealing nodules. A biopsy for the suspicious nodule has been scheduled. Her history of GERD previously affected her vocal cords, leading to a raspy voice since the early 2000s. A fundoplication surgery in 2002 improved her GERD symptoms significantly, though she still experiences symptoms of voice changes. Her voice has worsened over the past year, coinciding with the onset of tinnitus.   She describes her voice as 'raspy' with a sensation of spasming. She has difficulty swallowing, sometimes choking on food or water. She has not received any injections or medications for her vocal cords but worked with a human resources officer in 2011, which helped her breathing. She finds that drinking water helps alleviate her symptoms. She also finds that deep breathing alleviates her symptoms.   Regarding her tinnitus, it began about a year and a half ago, around the same time her voice started worsening. The ringing is bilateral, louder in the right ear, and persists throughout the day but does not disturb her sleep. She underwent a formal audiology evaluation a year and a half ago which did not show any significant finding.    PMH/Meds/All/SocHx/FamHx/ROS: Past Medical History:  Diagnosis Date   Acid reflux    Allergy     Anal fissure    Anxiety    Arthritis    Barrett's esophagus    Bipolar disorder (HCC)    Cervical herniated disc    Chronic headaches    Chronic pain    Depression    Dermatomyositis (HCC)    GERD (gastroesophageal reflux disease)    H/O vitamin D  deficiency    Hypercholesterolemia     IBS (irritable bowel syndrome)    Ocular migraine    Osteoporosis    Past Surgical History:  Procedure Laterality Date   APPENDECTOMY     ESOPHAGOGASTRIC FUNDOPLICATION     GALLBLADDER SURGERY  2001   GANGLION CYST EXCISION Left    NERVE SURGERY Right 03/07/2023   [facility]- Right ARM   RETINAL DETACHMENT SURGERY Right 11/29/2021   TONSILLECTOMY  1981   toupetfundoplicatio  2000   TUBAL LIGATION     No family history of bleeding disorders, wound healing problems or difficulty with anesthesia.  Social Connections: Socially Integrated (04/10/2024)   Social Connection and Isolation Panel    Frequency of Communication with Friends and Family: Twice a week    Frequency of Social Gatherings with Friends and Family: Once a week    Attends Religious Services: 1 to 4 times per year    Active Member of [employer] or Organizations: Yes    Attends Engineer, Structural: More than 4 times per year    Marital Status: Married    Current Outpatient Medications:    aspirin-acetaminophen-caffeine (EXCEDRIN MIGRAINE) 250-250-65 MG tablet, Take 1 tablet by mouth every 6 (six) hours as needed for headache., Disp: , Rfl:    atomoxetine (STRATTERA) 60 MG capsule, Take 60 mg by mouth every morning., Disp: , Rfl:    bisacodyl (DULCOLAX) 5 MG EC tablet, Take 5 mg by mouth as needed for moderate constipation (every 5 days)., Disp: , Rfl:  diphenhydrAMINE  (BENADRYL ) 25 mg capsule, Take 25-50 mg by mouth every 6 (six) hours as needed for itching., Disp: , Rfl:    DULoxetine  (CYMBALTA ) 60 MG capsule, Take 60 mg by mouth 2 (two) times daily., Disp: , Rfl:    estradiol  (ESTRACE ) 0.1 MG/GM vaginal cream, PLACE 0.5G NIGHTLY FOR TWO WEEKS THEN TWICE A WEEK AFTER, Disp: 42.5 g, Rfl: 11   gabapentin  (NEURONTIN ) 300 MG capsule, Take 1 capsule by mouth 3 (three) times daily., Disp: , Rfl:    hydroxychloroquine (PLAQUENIL) 200 MG tablet, Take 200 mg by mouth 2 (two) times daily., Disp: , Rfl:     loratadine-pseudoephedrine (CLARITIN-D 24 HOUR) 10-240 MG 24 hr tablet, Take 1 tablet by mouth as needed., Disp: , Rfl:    Multiple Vitamins-Minerals (MULTI ADULT GUMMIES PO), Take by mouth., Disp: , Rfl:    nitrofurantoin  (MACRODANTIN ) 100 MG capsule, Take 1 capsule (100 mg total) by mouth daily., Disp: 90 capsule, Rfl: 0   pantoprazole  (PROTONIX ) 40 MG tablet, Take 1 tablet (40 mg total) by mouth 2 (two) times daily., Disp: 90 tablet, Rfl: 1   Rimegepant Sulfate (NURTEC) 75 MG TBDP, Take 1 tablet (75 mg total) by mouth as needed., Disp: 16 tablet, Rfl: 11   sucralfate  (CARAFATE ) 1 g tablet, Take 1 tablet (1 g total) by mouth 2 (two) times daily., Disp: 180 tablet, Rfl: 1   valACYclovir (VALTREX) 1000 MG tablet, Take by mouth as needed., Disp: , Rfl:    clobetasol  ointment (TEMOVATE ) 0.05 %, Apply topically at bedtime. (Patient not taking: Reported on 05/19/2024), Disp: 60 g, Rfl: 5   docusate sodium (COLACE) 100 MG capsule, Take 200-300 mg by mouth daily. (Patient not taking: Reported on 05/19/2024), Disp: , Rfl:    inFLIXimab (REMICADE) 100 MG injection, Infused every 6 weeks (Patient not taking: Reported on 05/19/2024), Disp: , Rfl:   Current Facility-Administered Medications:    methylPREDNISolone  acetate (DEPO-MEDROL ) injection 80 mg, 80 mg, Intramuscular, Once,  A complete ROS was performed with pertinent positives/negatives noted in the HPI. The remainder of the ROS are negative.   Physical Exam:  BP 132/81 (BP Location: Right Arm, Patient Position: Sitting, Cuff Size: Normal)   Pulse 81   Wt 165 lb (74.8 kg)   SpO2 97%   BMI 25.65 kg/m  General: Well developed, well nourished. No acute distress. Voice intermittent adductor spasmodic dysphonia. Head/Face: Normocephalic. No sinus tenderness. Facial nerve intact and equal bilaterally. No facial lacerations. Eyes: PERRL, no scleral icterus or conjunctival hemorrhage. EOMI. Ears: No gross deformity. Normal external canal. Tympanic  membrane in tact bilaterally Hearing: Normal speech reception.  Nose: No gross deformity or lesions. No purulent discharge. No turbinate hypertrophy. Mouth/Oropharynx: Lips without any lesions. No mucosal lesions within the oropharynx. No tonsillar enlargement, exudate, or lesions. Pharyngeal walls symmetrical. Uvula midline. Tongue midline without lesions. Larynx: See TFL if applicable Nasopharynx: See TFL if applicable Neck: Trachea midline. No masses. No thyromegaly or nodules palpated. No crepitus. Lymphatic: No lymphadenopathy in the neck. Respiratory: No stridor or distress. Room air. Cardiovascular: Regular rate and rhythm. Extremities: No edema or cyanosis. Warm and well-perfused. Skin: No scars or lesions on face or neck. Neurologic: CN II-XII grossly intact. Moving all extremities without gross abnormality. Other:  Independent Review of Additional Tests or Records: None Procedures:  Preoperative diagnosis: hoarseness  Postoperative diagnosis:   same  Procedure: Flexible fiberoptic laryngoscopy with stroboscopy - CPT [id number]   Surgeon: Adah Malkin, DO  Anesthesia: 4% Topical lidocaine and Afrin  Complications:  None  Condition is stable throughout exam  Indications and consent:   The patient presents to the clinic with hoarseness. All the risks, benefits, and potential complications were reviewed with the patient preoperatively and informed verbal consent was obtained.  Procedure: The patient was seated upright in the exam chair.   Topical lidocaine and Afrin were applied to the nasal cavity. After adequate anesthesia had occurred, the flexible telescope with strobe capabilities was passed into the nasal cavity. The nasopharynx was patent without mass or lesion. The scope was passed behind the soft palate and directed toward the base of tongue. The base of tongue was visualized and was symmetric with no apparent masses or abnormal appearing tissue. There were no signs of a  mass or pooling of secretions in the piriform sinuses. The supraglottic structures were normal.  The true vocal cords are mobile. The medial edges were clear of any lesions. Closure was complete. Periodicity present. The mucosal wave and amplitude were normal. There is no interarytenoid pachydermia and post cricoid edema. The mucosa appears healthy.   The laryngoscope was then slowly withdrawn and the patient tolerated the procedure well. There were no complications or blood loss.   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 Intermittent, worse at the end of the day - Referred to speech therapy for voice rehabilitation. - Consider Botox  injections to vocal cords after speech therapy if symptoms persist  Thyroid  nodules, left suspicious and right Thyroid  nodules identified on ultrasound, with the left nodule being more suspicious. Biopsy scheduled for further evaluation. - Proceed with scheduled biopsy of the left thyroid  nodule.  Bilateral tinnitus Bilateral tinnitus present for approximately 1.5 years, with the right ear being louder. No associated hearing loss. - Advised use of sound masking techniques such as a fan, white noise, or radio if tinnitus becomes bothersome at night.   Follow-up in 6 months.  Adah Malkin, DO [facility]

## 2024-05-21 ENCOUNTER — Other Ambulatory Visit: Payer: Self-pay | Admitting: Gastroenterology

## 2024-05-21 DIAGNOSIS — K581 Irritable bowel syndrome with constipation: Secondary | ICD-10-CM

## 2024-05-21 MED ORDER — IBSRELA 50 MG PO TABS: 50.0000 mg | ORAL_TABLET | Freq: Two times a day (BID) | ORAL | 3 refills | Status: AC

## 2024-05-21 NOTE — Progress Notes (Signed)
 Tina Pena  sent in per request, sent to [facility]

## 2024-05-27 NOTE — Telephone Encounter (Signed)
 Please review notes from My Chart encounter and assist with PA.

## 2024-05-28 ENCOUNTER — Other Ambulatory Visit: Payer: Self-pay

## 2024-05-28 ENCOUNTER — Other Ambulatory Visit (HOSPITAL_COMMUNITY): Payer: Self-pay

## 2024-05-28 ENCOUNTER — Telehealth: Payer: Self-pay

## 2024-05-28 DIAGNOSIS — K581 Irritable bowel syndrome with constipation: Secondary | ICD-10-CM

## 2024-05-28 NOTE — Telephone Encounter (Signed)
 Pharmacy Patient Advocate Encounter   Received notification from Pt Calls Messages that prior authorization for Ibsrela  50MG  tablets is required/requested.   Insurance verification completed.   The patient is insured through [employer].   Per test claim: PA required; PA submitted to above mentioned insurance via Latent Key/confirmation #/EOC [id number] Status is pending

## 2024-05-28 NOTE — Telephone Encounter (Signed)
 PA request has been Submitted. New Encounter has been or will be created for follow up. For additional info see Pharmacy Prior Auth telephone encounter from 05/28/2024.

## 2024-05-28 NOTE — Telephone Encounter (Signed)
 Pharmacy Patient Advocate Encounter  Received notification from [employer] that Prior Authorization for Ibsrela  50MG  tablets  has been APPROVED from 06-27-2023 to 06-25-2025   PA #/Case ID/Reference #: [id number]

## 2024-05-29 ENCOUNTER — Encounter: Payer: Self-pay | Admitting: Family Medicine

## 2024-05-29 ENCOUNTER — Telehealth: Payer: Self-pay | Admitting: Gastroenterology

## 2024-05-29 ENCOUNTER — Ambulatory Visit: Admitting: Family Medicine

## 2024-05-29 VITALS — BP 130/80 | HR 86 | Temp 97.2°F | Ht 67.25 in | Wt 158.8 lb

## 2024-05-29 DIAGNOSIS — J020 Streptococcal pharyngitis: Secondary | ICD-10-CM | POA: Diagnosis not present

## 2024-05-29 DIAGNOSIS — H6993 Unspecified Eustachian tube disorder, bilateral: Secondary | ICD-10-CM | POA: Diagnosis not present

## 2024-05-29 DIAGNOSIS — J029 Acute pharyngitis, unspecified: Secondary | ICD-10-CM | POA: Diagnosis not present

## 2024-05-29 LAB — POCT RAPID STREP A (OFFICE): Rapid Strep A Screen: POSITIVE — AB

## 2024-05-29 LAB — POC COVID19 BINAXNOW: SARS Coronavirus 2 Ag: NEGATIVE

## 2024-05-29 MED ORDER — AMOXICILLIN 500 MG PO CAPS: 500.0000 mg | ORAL_CAPSULE | Freq: Three times a day (TID) | ORAL | 0 refills | Status: AC

## 2024-05-29 MED ORDER — AZELASTINE HCL 0.1 % NA SOLN: 2.0000 | Freq: Two times a day (BID) | NASAL | 12 refills | Status: AC

## 2024-05-29 MED ORDER — PREDNISONE 20 MG PO TABS: 20.0000 mg | ORAL_TABLET | Freq: Every day | ORAL | 0 refills | Status: DC

## 2024-05-29 NOTE — Progress Notes (Signed)
   Tina Pena is a 68 y.o. female who presents today for an office visit.  Assessment/Plan:  Strep pharyngitis /eustachian tube dysfunction Rapid strep positive.  COVID-negative.  Will start amoxicillin.  Encouraged hydration.  She also has evidence of eustachian tube dysfunction with bilateral ear pain and significantly retracted TMs bilaterally on exam.  No signs of otitis media.  She has had some improvement with Astelin nasal spray that she got for her husband-will send a new prescription in for this for her today.  She does have an upcoming flight and would be reasonable for us  to treat her eustachian tube more aggressively.  Will send in 5-day prednisone  burst 20 mg daily.  Encouraged hydration.  She will let us  know if not improving in the next several days.    Subjective:  HPI:  See assessment / plan for status of chronic conditions.    Discussed the use of AI scribe software for clinical note transcription with the patient, who gave verbal consent to proceed.  History of Present Illness Tina Pena is a 68 year old female who presents with cough and congestion. She is accompanied by her husband.  Her symptoms began 5 days ago with a severe sore throat and ear pain. The sore throat has improved but persists, while congestion remains unchanged, and she is unable to relieve ear pressure. She suspects a cold, possibly contracted during recent travel to [place] , as symptoms started the day after returning.  She has used Claritin D and a nasal spray, which provided some relief. She is concerned about her upcoming travel on the 14th and is worried about possibly having COVID-19, RSV, or other illnesses.  She has a history of thyroid  issues, with a follow-up scheduled for December 18th due to difficulty swallowing and talking. Her grandson, who is in college, was recently sick, which she believes might be related to her symptoms.  No fevers or chills. Reports ear pain and  inability to pop ears.         Objective:  Physical Exam: BP 130/80   Pulse 86   Temp (!) 97.2 F (36.2 C) (Temporal)   Ht 5' 7.25 (1.708 m)   Wt 158 lb 12.8 oz (72 kg)   SpO2 98%   BMI 24.69 kg/m   Gen: No acute distress, resting comfortably HEENT: TMs retracted bilaterally.  No effusion.  OP slightly erythematous with no tonsillar hypertrophy.  Nasal mucosa erythematous and boggy bilaterally. CV: Regular rate and rhythm with no murmurs appreciated Pulm: Normal work of breathing, clear to auscultation bilaterally with no crackles, wheezes, or rhonchi Neuro: Grossly normal, moves all extremities Psych: Normal affect and thought content      Mahrukh Seguin M. Kennyth, MD 05/29/2024 10:16 AM

## 2024-05-29 NOTE — Telephone Encounter (Signed)
OV notes faxed to number provided.

## 2024-05-29 NOTE — Telephone Encounter (Signed)
 Johnie with [facility] is calling to have updated office notes in regards to Isbrela. Please fax to [phone] attn: Lacie

## 2024-05-29 NOTE — Patient Instructions (Addendum)
 It was very nice to see you today!  VISIT SUMMARY: You visited us  today due to a cough, congestion, and ear pain that started after your recent trip to [place] . A strep test confirmed that you have acute streptococcal pharyngitis, and you also have eustachian tube dysfunction causing ear pressure.  YOUR PLAN: ACUTE STREPTOCOCCAL PHARYNGITIS: You have a throat infection caused by strep bacteria. -Take the prescribed amoxicillin as directed to treat the infection. -Use the nasal spray consistently to help with congestion. -Take the short course of prednisone  as prescribed. -Report back if your symptoms do not improve.  EUSTACHIAN TUBE DYSFUNCTION: You have pressure and congestion in your ears, which could cause complications during your upcoming flight. -Use the nasal spray consistently to help relieve ear pressure and prevent complications during your flight. -Monitor your symptoms and report if they do not improve.  Return if symptoms worsen or fail to improve.   Take care, Dr Kennyth  PLEASE NOTE:  If you had any lab tests, please let us  know if you have not heard back within a few days. You may see your results on mychart before we have a chance to review them but we will give you a call once they are reviewed by us .   If we ordered any referrals today, please let us  know if you have not heard from their office within the next week.   If you had any urgent prescriptions sent in today, please check with the pharmacy within an hour of our visit to make sure the prescription was transmitted appropriately.   Please try these tips to maintain a healthy lifestyle:  Eat at least 3 REAL meals and 1-2 snacks per day.  Aim for no more than 5 hours between eating.  If you eat breakfast, please do so within one hour of getting up.   Each meal should contain half fruits/vegetables, one quarter protein, and one quarter carbs (no bigger than a computer mouse)  Cut down on sweet beverages. This  includes juice, soda, and sweet tea.   Drink at least 1 glass of water with each meal and aim for at least 8 glasses per day  Exercise at least 150 minutes every week.

## 2024-06-03 ENCOUNTER — Encounter: Admitting: Podiatry

## 2024-06-05 ENCOUNTER — Encounter: Payer: Self-pay | Admitting: Family Medicine

## 2024-06-05 NOTE — Telephone Encounter (Signed)
 Please advise

## 2024-06-11 ENCOUNTER — Other Ambulatory Visit

## 2024-06-13 ENCOUNTER — Inpatient Hospital Stay: Admission: RE | Admit: 2024-06-13 | Discharge: 2024-06-13 | Attending: Physician Assistant

## 2024-06-13 ENCOUNTER — Other Ambulatory Visit (HOSPITAL_COMMUNITY)
Admission: RE | Admit: 2024-06-13 | Discharge: 2024-06-13 | Disposition: A | Discharge status: Home / Self Care | Source: Ambulatory Visit | Attending: Interventional Radiology | Admitting: Interventional Radiology

## 2024-06-13 DIAGNOSIS — E041 Nontoxic single thyroid nodule: Secondary | ICD-10-CM | POA: Diagnosis present

## 2024-06-16 LAB — CYTOLOGY - NON PAP

## 2024-06-17 ENCOUNTER — Ambulatory Visit: Payer: Self-pay | Admitting: Physician Assistant

## 2024-06-17 NOTE — Progress Notes (Signed)
 Spoke with patient. She expressed understanding that more testing needed to be done. She is aware that she needs to make contact with Tina Pena is she has not heard back from our office by Mid-Janurary.

## 2024-06-27 ENCOUNTER — Encounter (HOSPITAL_COMMUNITY): Payer: Self-pay

## 2024-06-29 ENCOUNTER — Ambulatory Visit: Payer: Self-pay | Admitting: Physician Assistant

## 2024-06-30 ENCOUNTER — Ambulatory Visit: Payer: Self-pay

## 2024-06-30 NOTE — Telephone Encounter (Signed)
 FYI

## 2024-06-30 NOTE — Telephone Encounter (Signed)
 FYI Only or Action Required?: FYI only for provider: ED advised.  Patient was last seen in primary care on 05/29/2024 by Kennyth Worth HERO, MD.  Called Nurse Triage reporting Chest Pain.  Symptoms began several days ago.  Interventions attempted: OTC medications: aleve.  Symptoms are: gradually worsening.  Triage Disposition: Go to ED Now (or PCP Triage)  Patient/caregiver understands and will follow disposition?: Yes     Reason for Disposition  Taking a deep breath makes pain worse  Answer Assessment - Initial Assessment Questions Patient reports starting 1/1 felt muscle pain upper right quad near ribs was sensitive to touch got worse does hurt if deep breaths hasn't stopped from doing things. Denies difficulty breathing. Has voice altered at baseline. Sensitive to touch , not sharp stabbing pain , if takes deep breath hurts 7/10 aleve helps a little . Spouse sounds like bruised rib hasn't done anything but after 1/1 slipped down and fell on buttocks did not hit head or ribs or anything. Has elevated liver enzymes unsure its its related . Patient advised by this RN to go ER to be seen for this patient is agreeable.      1. LOCATION: Where does it hurt?       Right side above rib cage in chest  2. RADIATION: Does the pain go anywhere else? (e.g., into neck, jaw, arms, back)     Denies  3. ONSET: When did the chest pain begin? (Minutes, hours or days)      1/1 4. PATTERN: Does the pain come and go, or has it been constant since it started?  Does it get worse with exertion?      Every time touches area of takes deep breaths  5. DURATION: How long does it last (e.g., seconds, minutes, hours)     Duration of deep breath or touch  6. SEVERITY: How bad is the pain?  (e.g., Scale 1-10; mild, moderate, or severe)     7/10   8. PULMONARY RISK FACTORS: Do you have any history of lung disease?  (e.g., blood clots in lung, asthma, emphysema, birth control pills)     Denies  history of PE  9. CAUSE: What do you think is causing the chest pain?     Unsure  10. OTHER SYMPTOMS: Do you have any other symptoms? (e.g., dizziness, nausea, vomiting, sweating, fever, difficulty breathing, cough)      Patient denies the following difficulty breathing , yellowing eyes, abdominal  pain, vomiting, dizziness, blood in stools, hemoptysis  Protocols used: Chest Pain-A-AH   Copied from CRM #[id number]. Topic: Clinical - Red Word Triage >> Jun 30, 2024  9:01 AM Tiffini S wrote: Red Word that prompted transfer to Nurse Triage: Started 06/26/24- hurts for deep breathing for chest pain and right upper side rib area

## 2024-07-01 ENCOUNTER — Emergency Department (HOSPITAL_BASED_OUTPATIENT_CLINIC_OR_DEPARTMENT_OTHER)
Admission: EM | Admit: 2024-07-01 | Discharge: 2024-07-01 | Disposition: A | Discharge status: Home / Self Care | Source: Ambulatory Visit | Attending: Emergency Medicine | Admitting: Emergency Medicine

## 2024-07-01 ENCOUNTER — Encounter: Payer: Self-pay | Admitting: Adult Health

## 2024-07-01 ENCOUNTER — Telehealth: Payer: Self-pay | Admitting: Adult Health

## 2024-07-01 ENCOUNTER — Emergency Department (HOSPITAL_BASED_OUTPATIENT_CLINIC_OR_DEPARTMENT_OTHER)

## 2024-07-01 ENCOUNTER — Encounter (HOSPITAL_BASED_OUTPATIENT_CLINIC_OR_DEPARTMENT_OTHER): Payer: Self-pay

## 2024-07-01 ENCOUNTER — Ambulatory Visit: Admitting: Adult Health

## 2024-07-01 ENCOUNTER — Other Ambulatory Visit: Payer: Self-pay

## 2024-07-01 DIAGNOSIS — R1011 Right upper quadrant pain: Secondary | ICD-10-CM | POA: Insufficient documentation

## 2024-07-01 DIAGNOSIS — Y93K1 Activity, walking an animal: Secondary | ICD-10-CM | POA: Insufficient documentation

## 2024-07-01 DIAGNOSIS — S20211A Contusion of right front wall of thorax, initial encounter: Secondary | ICD-10-CM | POA: Insufficient documentation

## 2024-07-01 DIAGNOSIS — R911 Solitary pulmonary nodule: Secondary | ICD-10-CM | POA: Diagnosis not present

## 2024-07-01 DIAGNOSIS — W19XXXA Unspecified fall, initial encounter: Secondary | ICD-10-CM

## 2024-07-01 DIAGNOSIS — S298XXA Other specified injuries of thorax, initial encounter: Secondary | ICD-10-CM

## 2024-07-01 DIAGNOSIS — S299XXA Unspecified injury of thorax, initial encounter: Secondary | ICD-10-CM | POA: Diagnosis present

## 2024-07-01 DIAGNOSIS — W548XXA Other contact with dog, initial encounter: Secondary | ICD-10-CM | POA: Diagnosis not present

## 2024-07-01 LAB — CBC WITH DIFFERENTIAL/PLATELET
Abs Immature Granulocytes: 0.02 K/uL (ref 0.00–0.07)
Basophils Absolute: 0.1 K/uL (ref 0.0–0.1)
Basophils Relative: 1 %
Eosinophils Absolute: 0.6 K/uL — ABNORMAL HIGH (ref 0.0–0.5)
Eosinophils Relative: 9 %
HCT: 40.5 % (ref 36.0–46.0)
Hemoglobin: 12.9 g/dL (ref 12.0–15.0)
Immature Granulocytes: 0 %
Lymphocytes Relative: 17 %
Lymphs Abs: 1.1 K/uL (ref 0.7–4.0)
MCH: 28 pg (ref 26.0–34.0)
MCHC: 31.9 g/dL (ref 30.0–36.0)
MCV: 88 fL (ref 80.0–100.0)
Monocytes Absolute: 0.4 K/uL (ref 0.1–1.0)
Monocytes Relative: 7 %
Neutro Abs: 4 K/uL (ref 1.7–7.7)
Neutrophils Relative %: 66 %
Platelets: 316 K/uL (ref 150–400)
RBC: 4.6 MIL/uL (ref 3.87–5.11)
RDW: 12.4 % (ref 11.5–15.5)
WBC: 6.2 K/uL (ref 4.0–10.5)
nRBC: 0 % (ref 0.0–0.2)

## 2024-07-01 LAB — LIPASE, BLOOD: Lipase: 43 U/L (ref 11–51)

## 2024-07-01 LAB — COMPREHENSIVE METABOLIC PANEL WITH GFR
ALT: 35 U/L (ref 0–44)
AST: 34 U/L (ref 15–41)
Albumin: 4.6 g/dL (ref 3.5–5.0)
Alkaline Phosphatase: 92 U/L (ref 38–126)
Anion gap: 11 (ref 5–15)
BUN: 18 mg/dL (ref 8–23)
CO2: 26 mmol/L (ref 22–32)
Calcium: 9.2 mg/dL (ref 8.9–10.3)
Chloride: 102 mmol/L (ref 98–111)
Creatinine, Ser: 0.69 mg/dL (ref 0.44–1.00)
GFR, Estimated: >60 mL/min
Glucose, Bld: 88 mg/dL (ref 70–99)
Potassium: 4.4 mmol/L (ref 3.5–5.1)
Sodium: 140 mmol/L (ref 135–145)
Total Bilirubin: 0.3 mg/dL (ref 0.0–1.2)
Total Protein: 7.4 g/dL (ref 6.5–8.1)

## 2024-07-01 MED ORDER — IOHEXOL 300 MG/ML  SOLN
75.0000 mL | Freq: Once | INTRAMUSCULAR | Status: AC | PRN
  Administered 2024-07-01: 150 mL via INTRAVENOUS

## 2024-07-01 MED ORDER — FENTANYL CITRATE (PF) 50 MCG/ML IJ SOSY
50.0000 ug | PREFILLED_SYRINGE | Freq: Once | INTRAMUSCULAR | Status: AC
  Administered 2024-07-01: 50 ug via INTRAVENOUS
  Filled 2024-07-01: qty 1

## 2024-07-01 NOTE — Discharge Instructions (Addendum)
 Your CT imaging showing of no evidence of traumatic injury.  You likely sustained a contusion of your ribs for which you can treat symptomatically with Tylenol and Motrin for pain control.  IMPRESSION:  1. No acute, traumatic injury within the chest, abdomen, or pelvis.  2. 4 mm right middle lobe nodule (axial 80). Comparison with outside imaging is  recommended to document stability. If none is available, follow-up may be  considered, as documented below.    Incidental single solid nodule under 6 mm  Low risk: No routine follow-up.  High risk: Optional at 12 months. Certain patients at high risk with suspicious  nodule morphology, upper lobe location, or both may warrant 12 month follow up.  NOTE:  These recommendations do not apply to patients with immunosuppression or  patients with known primary cancer.  REFERENCE:  Fleischner Society 2017 Guidelines for Management of Incidentally Detected  Pulmonary Nodules in Adults. Radiology 2017.

## 2024-07-01 NOTE — ED Provider Notes (Signed)
 " [place] [facility] Provider Note   CSN: [id number] Arrival date & time: 07/01/24  9060     Patient presents with: Chest Pain (Rib)   Tina Pena is a 69 y.o. female.    Chest Pain    69 year old female with medical history significant for bipolar disorder, dermatomyositis, HLD, depression, GERD, anxiety presenting to the emergency department with right-sided upper abdominal pain and right chest wall pain after a fall.  The patient states that she was walking her dog when she was pulled and slipped on a hill landing on the right side.  She endorses right lower rib pain, worse with deep inspiration, sharp.  Also having some right upper quadrant abdominal pain.  No vomiting.  She denies head trauma or loss of consciousness.  She is not on anticoagulation.  She presents emergency department GCS 15, ABC intact.  Prior to Admission medications  Medication Sig Start Date End Date Taking? Authorizing Provider  aspirin-acetaminophen-caffeine (EXCEDRIN MIGRAINE) 250-250-65 MG tablet Take 1 tablet by mouth every 6 (six) hours as needed for headache.    [provider]  atomoxetine (STRATTERA) 60 MG capsule Take 60 mg by mouth every morning. 05/12/24   [provider]  azelastine  (ASTELIN ) 0.1 % nasal spray Place 2 sprays into both nostrils 2 (two) times daily. 05/29/24   Kennyth Worth HERO, MD  diphenhydrAMINE  (BENADRYL ) 25 mg capsule Take 25-50 mg by mouth every 6 (six) hours as needed for itching.    [provider]  DULoxetine  (CYMBALTA ) 60 MG capsule Take 60 mg by mouth 2 (two) times daily.    [provider]  estradiol  (ESTRACE ) 0.1 MG/GM vaginal cream PLACE 0.5G NIGHTLY FOR TWO WEEKS THEN TWICE A WEEK AFTER 10/08/23   Marilynne Rosaline SAILOR, MD  gabapentin  (NEURONTIN ) 300 MG capsule Take 1 capsule by mouth 3 (three) times daily.    [provider]  hydroxychloroquine (PLAQUENIL) 200 MG tablet Take 200 mg by  mouth 2 (two) times daily. 08/07/23   [provider]  loratadine-pseudoephedrine (CLARITIN-D 24 HOUR) 10-240 MG 24 hr tablet Take 1 tablet by mouth as needed.    [provider]  Multiple Vitamins-Minerals (MULTI ADULT GUMMIES PO) Take by mouth.    [provider]  pantoprazole  (PROTONIX ) 40 MG tablet Take 1 tablet (40 mg total) by mouth 2 (two) times daily. 05/12/24   May, Deanna J, NP  predniSONE  (DELTASONE ) 20 MG tablet Take 1 tablet (20 mg total) by mouth daily with breakfast. 05/29/24   Kennyth Worth HERO, MD  Rimegepant Sulfate (NURTEC) 75 MG TBDP Take 1 tablet (75 mg total) by mouth as needed. 03/31/24   Whitfield Raisin, NP  sucralfate  (CARAFATE ) 1 g tablet Take 1 tablet (1 g total) by mouth 2 (two) times daily. 09/07/23 05/29/24  Allwardt, Alyssa M, PA-C  Tenapanor HCl (IBSRELA ) 50 MG TABS Take 50 mg by mouth 2 (two) times daily before a meal. 05/21/24   May, Deanna J, NP  valACYclovir (VALTREX) 1000 MG tablet Take by mouth as needed. 10/17/21   [provider]    Allergies: Codeine, Garlic, Other, and Zoloft [sertraline]    Review of Systems  Cardiovascular:  Positive for chest pain.  All other systems reviewed and are negative.   Updated Vital Signs BP (!) 149/82 (BP Location: Left Arm)   Pulse 78   Temp 97.7 F (36.5 C) (Oral)   Resp 18   SpO2 97%   Physical Exam Vitals and  nursing note reviewed.  Constitutional:      General: She is not in acute distress.    Appearance: She is well-developed.     Comments: GCS 15, ABC intact  HENT:     Head: Normocephalic and atraumatic.  Eyes:     Extraocular Movements: Extraocular movements intact.     Conjunctiva/sclera: Conjunctivae normal.     Pupils: Pupils are equal, round, and reactive to light.  Neck:     Comments: No midline tenderness to palpation of the cervical spine.  Range of motion intact Cardiovascular:     Rate and Rhythm: Normal rate and regular rhythm.     Heart sounds: No murmur  heard. Pulmonary:     Effort: Pulmonary effort is normal. No respiratory distress.     Breath sounds: Normal breath sounds.  Chest:     Comments: Clavicles stable nontender to AP compression.  Chest wall stable with right sided chest wall tenderness to palpation Abdominal:     Palpations: Abdomen is soft.     Tenderness: There is abdominal tenderness in the right upper quadrant.     Comments: Pelvis stable to lateral compression.  Right upper quadrant tenderness to palpation  Musculoskeletal:     Cervical back: Neck supple.     Comments: No midline tenderness to palpation of the thoracic or lumbar spine.  Extremities atraumatic with intact range of motion  Skin:    General: Skin is warm and dry.  Neurological:     Mental Status: She is alert.     Comments: Cranial nerves II through XII grossly intact.  Moving all 4 extremities spontaneously.  Sensation grossly intact all 4 extremities     (all labs ordered are listed, but only abnormal results are displayed) Labs Reviewed  CBC WITH DIFFERENTIAL/PLATELET - Abnormal; Notable for the following components:      Result Value   Eosinophils Absolute 0.6 (*)    All other components within normal limits  COMPREHENSIVE METABOLIC PANEL WITH GFR  LIPASE, BLOOD    EKG: EKG Interpretation Date/Time:  Tuesday July 01 2024 09:49:48 EST Ventricular Rate:  98 PR Interval:  156 QRS Duration:  78 QT Interval:  342 QTC Calculation: 437 R Axis:   69  Text Interpretation: Sinus rhythm Consider right atrial enlargement Confirmed by Jerrol Agent ([id number]) on 07/01/2024 10:47:40 AM  Radiology: CT CHEST ABDOMEN PELVIS W CONTRAST Result Date: 07/01/2024 EXAM: CT CHEST, ABDOMEN AND PELVIS WITH CONTRAST 07/01/2024 12:59:40 PM TECHNIQUE: CT of the chest, abdomen and pelvis was performed with the administration of 75 mL of intravenous iohexol  (OMNIPAQUE ) 300 MG/ML solution. Multiplanar reformatted images are provided for review. Automated exposure  control, iterative reconstruction, and/or weight based adjustment of the mA/kV was utilized to reduce the radiation dose to as low as reasonably achievable. COMPARISON: 07/01/2024 CLINICAL HISTORY: Polytrauma, blunt. FINDINGS: CHEST: MEDIASTINUM AND LYMPH NODES: Heart and pericardium are unremarkable. Atherosclerosis in the LAD. Ectasia of the ascending thoracic aorta measuring 3.8 cm. No aortic dissection. While not optimized for the evaluation of the pulmonary arteries, no central pulmonary embolus present. Small paraesophageal hiatal hernia. The central airways are clear. No mediastinal, hilar or axillary lymphadenopathy. LUNGS AND PLEURA: No focal consolidation or pulmonary edema. No pleural effusion. No pneumothorax. 4 mm right middle lobe nodule (axial 80). ABDOMEN AND PELVIS: LIVER: Unremarkable. GALLBLADDER AND BILE DUCTS: Moderate intrahepatic and extrahepatic biliary ductal dilation, which is likely related to the prior cholecystectomy. SPLEEN: No acute abnormality. PANCREAS: No acute abnormality. ADRENAL GLANDS: No acute  abnormality. KIDNEYS, URETERS AND BLADDER: No stones in the kidneys or ureters. No hydronephrosis. No perinephric or periureteral stranding. Urinary bladder is unremarkable. GI AND BOWEL: Stomach demonstrates no acute abnormality. There is no bowel obstruction. Sigmoid diverticulosis. APPENDIX: The appendix was not visualized. No pericecal or right lower quadrant inflammatory changes to suggest acute appendicitis. REPRODUCTIVE ORGANS: Age related atrophy of the uterus and ovaries. PERITONEUM AND RETROPERITONEUM: No ascites. No free air. Small well encapsulated region of fat in the pelvis, probably representing remote changes from epiploic appendagitis (axial 76). VASCULATURE: Aorta is normal in caliber. ABDOMINAL AND PELVIS LYMPH NODES: No lymphadenopathy. BONES AND SOFT TISSUES: No acute osseous abnormality. No focal soft tissue abnormality. IMPRESSION: 1. No acute, traumatic injury  within the chest, abdomen, or pelvis. 2. 4 mm right middle lobe nodule (axial 80). Comparison with outside imaging is recommended to document stability. If none is available, follow-up may be considered, as documented below. Incidental single solid nodule under 6 mm Low risk: No routine follow-up. High risk: Optional at 12 months. Certain patients at high risk with suspicious nodule morphology, upper lobe location, or both may warrant 12 month follow up. NOTE: These recommendations do not apply to patients with immunosuppression or patients with known primary cancer. REFERENCE: Fleischner Society 2017 Guidelines for Management of Incidentally Detected Pulmonary Nodules in Adults. Radiology 2017. Electronically signed by: Rogelia Myers MD 07/01/2024 02:07 PM EST RP Workstation: [id number]   DG Ribs Unilateral W/Chest Right Result Date: 07/01/2024 CLINICAL DATA:  Fall.  Right rib pain tenderness. EXAM: RIGHT RIBS AND CHEST - 3+ VIEW COMPARISON:  None Available. FINDINGS: No fracture or other bone lesions are seen involving the ribs. There is no evidence of pneumothorax or pleural effusion. Both lungs are clear. Heart size and mediastinal contours are within normal limits. IMPRESSION: Negative. Electronically Signed   By: Norleen DELENA Kil M.D.   On: 07/01/2024 11:12     Procedures   Medications Ordered in the ED  fentaNYL  (SUBLIMAZE ) injection 50 mcg (50 mcg Intravenous Given 07/01/24 1152)  iohexol  (OMNIPAQUE ) 300 MG/ML solution 75 mL (150 mLs Intravenous Contrast Given 07/01/24 1247)                                    Medical Decision Making Amount and/or Complexity of Data Reviewed Labs: ordered. Radiology: ordered.  Risk Prescription drug management.    69 year old female with medical history significant for bipolar disorder, dermatomyositis, HLD, depression, GERD, anxiety presenting to the emergency department with right-sided upper abdominal pain and right chest wall pain after a fall.  The  patient states that she was walking her dog when she was pulled and slipped on a hill landing on the right side.  She endorses right lower rib pain, worse with deep inspiration, sharp.  Also having some right upper quadrant abdominal pain.  No vomiting.  She denies head trauma or loss of consciousness.  She is not on anticoagulation.  She presents emergency department GCS 15, ABC intact.  On arrival, the patient was afebrile, hemodynamically stable. Patient presenting with right rib pain as well as right upper quadrant abdominal pain after a fall 5 days ago.  Endorsing worsening pain.  On exam the patient had tenderness palpation of the right ribs as well as right upper quadrant tenderness to palpation.  She states that she has had her gallbladder removed.  Considered liver injury versus rib fracture.  X-ray imaging was obtained  which showed no evidence of fracture.  Will obtain further evaluation with CT chest abdomen pelvis.  Do not think CT imaging of the head is indicated the patient denies any head trauma, denies any headache.  No midline tenderness of the cervical spine, patient denies any neck pain.  Patient denies neck trauma.  XR Ribs R with Chest: IMPRESSION:  Negative.   CT C/A/P: IMPRESSION:  1. No acute, traumatic injury within the chest, abdomen, or pelvis.  2. 4 mm right middle lobe nodule (axial 80). Comparison with outside imaging is  recommended to document stability. If none is available, follow-up may be  considered, as documented below.    Incidental single solid nodule under 6 mm  Low risk: No routine follow-up.  High risk: Optional at 12 months. Certain patients at high risk with suspicious  nodule morphology, upper lobe location, or both may warrant 12 month follow up.  NOTE:  These recommendations do not apply to patients with immunosuppression or  patients with known primary cancer.  REFERENCE:  Fleischner Society 2017 Guidelines for Management of Incidentally  Detected  Pulmonary Nodules in Adults. Radiology 2017.    Patient advised to follow-up outpatient regarding the incidental finding of pulmonary nodule.  No evidence of traumatic injury, suspect likely rib contusion.  Advised Tylenol and Motrin for pain control, opiates offered but patient declined.  Patient overall stable for discharge at this time.     Final diagnoses:  Fall, initial encounter  Rib contusion, right, initial encounter  Pulmonary nodule    ED Discharge Orders     None          Jerrol Agent, MD 07/01/24 1417  "

## 2024-07-01 NOTE — Telephone Encounter (Signed)
 I called pt back and r/s for 07/03/24.

## 2024-07-01 NOTE — ED Triage Notes (Signed)
 R lower rib pain, pain when taking deep breath.   Reports slipping on hill 5 days ago, but denies injury from fall.

## 2024-07-01 NOTE — Telephone Encounter (Signed)
 Patient's husband LVM at 11:50 am; Has an appt at 1:00 pm for Botox . She is at ER getting a CT Scan for some pain in rib cage. Probably will not make appointment. Want to see if can reschedule as soon as possible.

## 2024-07-02 ENCOUNTER — Telehealth: Payer: Self-pay

## 2024-07-02 NOTE — Telephone Encounter (Signed)
 Transition Care Management Follow-up Telephone Call Date of discharge and from where: 07/01/24 [facility] How have you been since you were released from the hospital? [facility] Any questions or concerns? No  Items Reviewed: Did the pt receive and understand the discharge instructions provided? Yes  Medications obtained and verified? Yes  Other? No  Any new allergies since your discharge? No  Dietary orders reviewed? No Do you have support at home? Yes   Home Care and Equipment/Supplies: Were home health services ordered? not applicable If so, what is the name of the agency?   Has the agency set up a time to come to the patient's home? not applicable Were any new equipment or medical supplies ordered?  No What is the name of the medical supply agency?  Were you able to get the supplies/equipment? not applicable Do you have any questions related to the use of the equipment or supplies? No  Functional Questionnaire: (I = Independent and D = Dependent) ADLs: I  Bathing/Dressing- I  Meal Prep- I  Eating- I  Maintaining continence- I  Transferring/Ambulation- I  Managing Meds- I  Follow up appointments reviewed:  PCP Hospital f/u appt confirmed? Yes  Scheduled to see Allwardt, Alyssa on 07/15/24  Specialist Hospital f/u appt confirmed? N/A  Are transportation arrangements needed? No  If their condition worsens, is the pt aware to call PCP or go to the Emergency Dept.? YES Was the patient provided with contact information for the PCP's office or ED? Yes Was to pt encouraged to call back with questions or concerns? Yes

## 2024-07-03 ENCOUNTER — Encounter: Payer: Self-pay | Admitting: Adult Health

## 2024-07-03 ENCOUNTER — Ambulatory Visit: Admitting: Adult Health

## 2024-07-03 VITALS — BP 134/79 | HR 83

## 2024-07-03 DIAGNOSIS — G43701 Chronic migraine without aura, not intractable, with status migrainosus: Secondary | ICD-10-CM | POA: Diagnosis not present

## 2024-07-03 MED ORDER — ONABOTULINUMTOXINA 200 UNITS IJ SOLR
155.0000 [IU] | Freq: Once | INTRAMUSCULAR | Status: AC
  Administered 2024-07-03: 155 [IU] via INTRAMUSCULAR

## 2024-07-03 NOTE — Progress Notes (Signed)
 Botox - 200 units x 1 vial Lot: [id number]  Expiration: 2028/03 NDC: 0023-3921-02  Bacteriostatic 0.9% Sodium Chloride - 4mL total Onu:[id number] Expiration: 2025/09/23 NDC: 9590-8033-97  Dx: H56.298  Witnessed by: Heather BROCKS, RN.

## 2024-07-03 NOTE — Progress Notes (Signed)
 "    Update 07/03/2024 JM: Patient returns for repeat botox . Prior injection on 03/31/2024.  She reports great benefit with Botox  injection with >50% migraine reduction.  Migraines remain very well-controlled.  At prior visit, she was switched to Nurtec which has been greatly beneficial.  Tolerated procedure well today.  Return in 3 months for repeat injection.       Consent Form Botulism Toxin Injection For Chronic Migraine    Reviewed orally with patient, additionally signature is on file:  Botulism toxin has been approved by the Federal drug administration for treatment of chronic migraine. Botulism toxin does not cure chronic migraine and it may not be effective in some patients.  The administration of botulism toxin is accomplished by injecting a small amount of toxin into the muscles of the neck and head. Dosage must be titrated for each individual. Any benefits resulting from botulism toxin tend to wear off after 3 months with a repeat injection required if benefit is to be maintained. Injections are usually done every 3-4 months with maximum effect peak achieved by about 2 or 3 weeks. Botulism toxin is expensive and you should be sure of what costs you will incur resulting from the injection.  The side effects of botulism toxin use for chronic migraine may include:   -Transient, and usually mild, facial weakness with facial injections  -Transient, and usually mild, head or neck weakness with head/neck injections  -Reduction or loss of forehead facial animation due to forehead muscle weakness  -Eyelid drooping  -Dry eye  -Pain at the site of injection or bruising at the site of injection  -Double vision  -Potential unknown long term risks   Contraindications: You should not have Botox  if you are pregnant, nursing, allergic to albumin, have an infection, skin condition, or muscle weakness at the site of the injection, or have myasthenia gravis, Lambert-Eaton syndrome, or  ALS.  It is also possible that as with any injection, there may be an allergic reaction or no effect from the medication. Reduced effectiveness after repeated injections is sometimes seen and rarely infection at the injection site may occur. All care will be taken to prevent these side effects. If therapy is given over a long time, atrophy and wasting in the muscle injected may occur. Occasionally the patient's become refractory to treatment because they develop antibodies to the toxin. In this event, therapy needs to be modified.  I have read the above information and consent to the administration of botulism toxin.    BOTOX  PROCEDURE NOTE FOR MIGRAINE HEADACHE  Contraindications and precautions discussed with patient(above). Aseptic procedure was observed and patient tolerated procedure. Procedure performed by Harlene Bogaert, AGNP-BC.   The condition has existed for more than 6 months, and pt does not have a diagnosis of ALS, Myasthenia Gravis or Lambert-Eaton Syndrome.  Risks and benefits of injections discussed and pt agrees to proceed with the procedure.  Written consent obtained  These injections are medically necessary. Pt  receives good benefits from these injections. These injections do not cause sedations or hallucinations which the oral therapies may cause.   Description of procedure:  The patient was placed in a sitting position. The standard protocol was used for Botox  as follows, with 5 units of Botox  injected at each site:  -Procerus muscle, midline injection  -Corrugator muscle, bilateral injection  -Frontalis muscle, bilateral injection, with 2 sites each side, medial injection was performed in the upper one third of the frontalis muscle, in the region vertical  from the medial inferior edge of the superior orbital rim. The lateral injection was again in the upper one third of the forehead vertically above the lateral limbus of the cornea, 1.5 cm lateral to the medial  injection site.  -Temporalis muscle injection, 4 sites, bilaterally. The first injection was 3 cm above the tragus of the ear, second injection site was 1.5 cm to 3 cm up from the first injection site in line with the tragus of the ear. The third injection site was 1.5-3 cm forward between the first 2 injection sites. The fourth injection site was 1.5 cm posterior to the second injection site. 5th site laterally in the temporalis  muscleat the level of the outer canthus.  -Occipitalis muscle injection, 3 sites, bilaterally. The first injection was done one half way between the occipital protuberance and the tip of the mastoid process behind the ear. The second injection site was done lateral and superior to the first, 1 fingerbreadth from the first injection. The third injection site was 1 fingerbreadth superiorly and medially from the first injection site.  -Cervical paraspinal muscle injection, 2 sites, bilaterally. The first injection site was 1 cm from the midline of the cervical spine, 3 cm inferior to the lower border of the occipital protuberance. The second injection site was 1.5 cm superiorly and laterally to the first injection site.  -Trapezius muscle injection was performed at 3 sites, bilaterally. The first injection site was in the upper trapezius muscle halfway between the inflection point of the neck, and the acromion. The second injection site was one half way between the acromion and the first injection site. The third injection was done between the first injection site and the inflection point of the neck.    A total of 200 units of Botox  was prepared, 155 units of Botox  was injected as documented above, any Botox  not injected was wasted. The patient tolerated the procedure well, there were no complications of the above procedure.   Harlene Bogaert, AGNP-BC  [facility] [address] [place], [place] [address]  Phone [phone] Fax  [phone] Note: This document was prepared with digital dictation and possible smart phrase technology. Any transcriptional errors that result from this process are unintentional.    "

## 2024-07-10 ENCOUNTER — Ambulatory Visit: Admitting: Obstetrics and Gynecology

## 2024-07-15 ENCOUNTER — Encounter: Payer: Self-pay | Admitting: Physician Assistant

## 2024-07-15 ENCOUNTER — Ambulatory Visit: Admitting: Physician Assistant

## 2024-07-15 VITALS — BP 130/72 | HR 82 | Temp 97.6°F | Ht 67.25 in | Wt 161.2 lb

## 2024-07-15 DIAGNOSIS — I998 Other disorder of circulatory system: Secondary | ICD-10-CM | POA: Diagnosis not present

## 2024-07-15 DIAGNOSIS — R911 Solitary pulmonary nodule: Secondary | ICD-10-CM | POA: Diagnosis not present

## 2024-07-15 DIAGNOSIS — R748 Abnormal levels of other serum enzymes: Secondary | ICD-10-CM

## 2024-07-15 DIAGNOSIS — E042 Nontoxic multinodular goiter: Secondary | ICD-10-CM | POA: Diagnosis not present

## 2024-07-15 DIAGNOSIS — Z7722 Contact with and (suspected) exposure to environmental tobacco smoke (acute) (chronic): Secondary | ICD-10-CM

## 2024-07-15 NOTE — Progress Notes (Unsigned)
 "   Patient ID: Tina Pena, female    DOB: 1955/11/23, 69 y.o.   MRN: [id number]   Assessment & Plan:  There are no diagnoses linked to this encounter.    Assessment and Plan Assessment & Plan       No follow-ups on file.    Subjective:    Chief Complaint  Patient presents with   Follow-up    Pt in office to see PCP following ED visit and [facility]; pt states apparently had bruised rib and feeling much better; found nodule on right lung and needed to follow up on this, pt was around both parents being smokers, Mom passed away young of lung cancer and Dad hx COPD passed away 2009/08/11.     HPI Discussed the use of AI scribe software for clinical note transcription with the patient, who gave verbal consent to proceed.  History of Present Illness      Past Medical History:  Diagnosis Date   Acid reflux    Allergy     Anal fissure    Anxiety    Arthritis    Barrett's esophagus    Bipolar disorder (HCC)    Cervical herniated disc    Chronic headaches    Chronic pain    Closed nondisplaced fracture of fifth metacarpal bone of left hand 01/22/2024   Depression    Dermatomyositis (HCC)    Encounter for other orthopedic aftercare 02/19/2018   GERD (gastroesophageal reflux disease)    H/O vitamin D  deficiency    Hypercholesterolemia    IBS (irritable bowel syndrome)    Ocular migraine    Osteoporosis     Past Surgical History:  Procedure Laterality Date   APPENDECTOMY     ESOPHAGOGASTRIC FUNDOPLICATION     GALLBLADDER SURGERY  1999/08/12   GANGLION CYST EXCISION Left    NERVE SURGERY Right 03/07/2023   [facility]- Right ARM   RETINAL DETACHMENT SURGERY Right 11/29/2021   TONSILLECTOMY  1981   toupetfundoplicatio  2000   TUBAL LIGATION      Family History  Problem Relation Age of Onset   Cancer Mother    Lung cancer Mother    COPD Father    Cancer Father        breast   Bipolar disorder Father    Anxiety disorder Father    Depression  Father    Paranoid behavior Father    Heart disease Father    Stroke Maternal Grandmother    Hypertension Maternal Grandmother    Bipolar disorder Maternal Grandmother    Dementia Maternal Grandmother    Hypertension Maternal Grandfather    Stroke Paternal Grandmother    Hypertension Paternal Grandmother    Bipolar disorder Paternal Grandmother    Dementia Paternal Grandmother    Hypertension Paternal Grandfather    Stomach cancer Neg Hx    Colon cancer Neg Hx    Esophageal cancer Neg Hx    Rectal cancer Neg Hx    Bladder Cancer Neg Hx    Uterine cancer Neg Hx    Renal cancer Neg Hx     Social History[1]   Allergies[2]  Review of Systems NEGATIVE UNLESS OTHERWISE INDICATED IN HPI      Objective:     BP 130/72 (BP Location: Left Arm, Patient Position: Sitting, Cuff Size: Normal)   Pulse 82   Temp 97.6 F (36.4 C) (Temporal)   Ht 5' 7.25 (1.708 m)   Wt 161 lb 3.2 oz (73.1 kg)  SpO2 98%   BMI 25.06 kg/m   Wt Readings from Last 3 Encounters:  07/15/24 161 lb 3.2 oz (73.1 kg)  05/29/24 158 lb 12.8 oz (72 kg)  05/19/24 165 lb (74.8 kg)    BP Readings from Last 3 Encounters:  07/15/24 130/72  07/03/24 134/79  07/01/24 (!) 150/84     Physical Exam          Aarron Wierzbicki M Delance Weide, PA-C    [1]  Social History Tobacco Use   Smoking status: Never    Passive exposure: Past   Smokeless tobacco: Never  Vaping Use   Vaping status: Never Used  Substance Use Topics   Alcohol use: Yes    Alcohol/week: 2.0 standard drinks of alcohol    Types: 2 Glasses of wine per week    Comment: occasional glass of wine   Drug use: Never  [2]  Allergies Allergen Reactions   Codeine Nausea And Vomiting    Other reaction(s): extreme stomach upset, GI Intolerance   Garlic Nausea And Vomiting   Other Nausea And Vomiting    Onions    Zoloft [Sertraline] Rash and Other (See Comments)    Other reaction(s): Confusion  Other reaction(s): confusion, Other (See Comments)    "

## 2024-07-23 ENCOUNTER — Other Ambulatory Visit: Payer: Self-pay | Admitting: Rheumatology

## 2024-07-23 DIAGNOSIS — R7989 Other specified abnormal findings of blood chemistry: Secondary | ICD-10-CM

## 2024-09-17 ENCOUNTER — Ambulatory Visit

## 2024-09-25 ENCOUNTER — Ambulatory Visit: Admitting: Adult Health

## 2024-10-01 ENCOUNTER — Ambulatory Visit: Admitting: Adult Health

## 2024-10-20 ENCOUNTER — Ambulatory Visit: Admitting: Physician Assistant

## 2024-11-24 ENCOUNTER — Ambulatory Visit (INDEPENDENT_AMBULATORY_CARE_PROVIDER_SITE_OTHER)

## 2025-03-17 ENCOUNTER — Ambulatory Visit: Admitting: Physician Assistant
